# Patient Record
Sex: Male | Born: 1945 | Race: White | Hispanic: No | Marital: Single | Smoking: Never smoker
Health system: Southern US, Community
[De-identification: ages and names within clinical notes are randomized; demographics above are authoritative.]

## PROBLEM LIST (undated history)

## (undated) DIAGNOSIS — E785 Hyperlipidemia, unspecified: Secondary | ICD-10-CM

## (undated) DIAGNOSIS — IMO0001 Reserved for inherently not codable concepts without codable children: Secondary | ICD-10-CM

## (undated) DIAGNOSIS — G4730 Sleep apnea, unspecified: Secondary | ICD-10-CM

## (undated) DIAGNOSIS — L259 Unspecified contact dermatitis, unspecified cause: Secondary | ICD-10-CM

## (undated) DIAGNOSIS — M1990 Unspecified osteoarthritis, unspecified site: Secondary | ICD-10-CM

## (undated) DIAGNOSIS — I639 Cerebral infarction, unspecified: Secondary | ICD-10-CM

## (undated) DIAGNOSIS — I6529 Occlusion and stenosis of unspecified carotid artery: Secondary | ICD-10-CM

## (undated) DIAGNOSIS — I10 Essential (primary) hypertension: Secondary | ICD-10-CM

## (undated) DIAGNOSIS — I77810 Thoracic aortic ectasia: Secondary | ICD-10-CM

## (undated) DIAGNOSIS — I4891 Unspecified atrial fibrillation: Secondary | ICD-10-CM

## (undated) DIAGNOSIS — J45909 Unspecified asthma, uncomplicated: Secondary | ICD-10-CM

## (undated) DIAGNOSIS — R0683 Snoring: Secondary | ICD-10-CM

## (undated) DIAGNOSIS — K635 Polyp of colon: Secondary | ICD-10-CM

## (undated) DIAGNOSIS — I4819 Other persistent atrial fibrillation: Secondary | ICD-10-CM

## (undated) DIAGNOSIS — D649 Anemia, unspecified: Secondary | ICD-10-CM

## (undated) DIAGNOSIS — E663 Overweight: Secondary | ICD-10-CM

## (undated) DIAGNOSIS — R0602 Shortness of breath: Secondary | ICD-10-CM

## (undated) HISTORY — DX: Unspecified osteoarthritis, unspecified site: M19.90

## (undated) HISTORY — DX: Snoring: R06.83

## (undated) HISTORY — DX: Reserved for inherently not codable concepts without codable children: IMO0001

## (undated) HISTORY — DX: Polyp of colon: K63.5

## (undated) HISTORY — DX: Cerebral infarction, unspecified: I63.9

## (undated) HISTORY — DX: Unspecified contact dermatitis, unspecified cause: L25.9

## (undated) HISTORY — DX: Essential (primary) hypertension: I10

## (undated) HISTORY — DX: Other persistent atrial fibrillation: I48.19

## (undated) HISTORY — DX: Overweight: E66.3

## (undated) HISTORY — DX: Hyperlipidemia, unspecified: E78.5

## (undated) HISTORY — DX: Shortness of breath: R06.02

## (undated) HISTORY — PX: COLONOSCOPY: SHX174

## (undated) HISTORY — DX: Anemia, unspecified: D64.9

## (undated) HISTORY — DX: Thoracic aortic ectasia: I77.810

---

## 1967-11-13 HISTORY — PX: OTHER SURGICAL HISTORY: SHX169

## 2000-12-23 ENCOUNTER — Observation Stay (HOSPITAL_COMMUNITY): Admission: EM | Admit: 2000-12-23 | Discharge: 2000-12-24 | Payer: Self-pay | Admitting: Emergency Medicine

## 2000-12-23 ENCOUNTER — Encounter: Payer: Self-pay | Admitting: Emergency Medicine

## 2000-12-30 ENCOUNTER — Ambulatory Visit (HOSPITAL_COMMUNITY): Admission: RE | Admit: 2000-12-30 | Discharge: 2000-12-30 | Payer: Self-pay | Admitting: Cardiology

## 2013-08-24 ENCOUNTER — Other Ambulatory Visit: Payer: Self-pay | Admitting: Family Medicine

## 2013-08-24 DIAGNOSIS — R1011 Right upper quadrant pain: Secondary | ICD-10-CM

## 2013-08-28 ENCOUNTER — Other Ambulatory Visit: Payer: Self-pay | Admitting: Family Medicine

## 2013-08-28 ENCOUNTER — Ambulatory Visit
Admission: RE | Admit: 2013-08-28 | Discharge: 2013-08-28 | Disposition: A | Discharge status: Home / Self Care | Payer: Medicare Other | Source: Ambulatory Visit | Attending: Family Medicine | Admitting: Family Medicine

## 2013-08-28 DIAGNOSIS — R1011 Right upper quadrant pain: Secondary | ICD-10-CM

## 2013-09-14 ENCOUNTER — Other Ambulatory Visit: Payer: Self-pay | Admitting: Gastroenterology

## 2013-09-14 DIAGNOSIS — R109 Unspecified abdominal pain: Secondary | ICD-10-CM

## 2013-09-18 ENCOUNTER — Ambulatory Visit
Admission: RE | Admit: 2013-09-18 | Discharge: 2013-09-18 | Disposition: A | Discharge status: Home / Self Care | Payer: Medicare Other | Source: Ambulatory Visit | Attending: Gastroenterology | Admitting: Gastroenterology

## 2013-09-18 DIAGNOSIS — R109 Unspecified abdominal pain: Secondary | ICD-10-CM

## 2013-09-18 MED ORDER — IOHEXOL 300 MG/ML  SOLN
100.0000 mL | Freq: Once | INTRAMUSCULAR | Status: AC | PRN
  Administered 2013-09-18: 100 mL via INTRAVENOUS

## 2014-01-15 ENCOUNTER — Encounter: Payer: Self-pay | Admitting: Cardiology

## 2014-01-15 ENCOUNTER — Ambulatory Visit (INDEPENDENT_AMBULATORY_CARE_PROVIDER_SITE_OTHER): Payer: Medicare Other | Admitting: Cardiology

## 2014-01-15 ENCOUNTER — Encounter: Payer: Self-pay | Admitting: General Surgery

## 2014-01-15 VITALS — BP 154/90 | HR 64 | Ht 67.25 in | Wt 204.8 lb

## 2014-01-15 DIAGNOSIS — D649 Anemia, unspecified: Secondary | ICD-10-CM

## 2014-01-15 DIAGNOSIS — R9430 Abnormal result of cardiovascular function study, unspecified: Secondary | ICD-10-CM

## 2014-01-15 DIAGNOSIS — E785 Hyperlipidemia, unspecified: Secondary | ICD-10-CM

## 2014-01-15 DIAGNOSIS — R0600 Dyspnea, unspecified: Secondary | ICD-10-CM

## 2014-01-15 DIAGNOSIS — I4891 Unspecified atrial fibrillation: Secondary | ICD-10-CM

## 2014-01-15 DIAGNOSIS — R0989 Other specified symptoms and signs involving the circulatory and respiratory systems: Secondary | ICD-10-CM

## 2014-01-15 DIAGNOSIS — D126 Benign neoplasm of colon, unspecified: Secondary | ICD-10-CM

## 2014-01-15 DIAGNOSIS — R0602 Shortness of breath: Secondary | ICD-10-CM

## 2014-01-15 DIAGNOSIS — K635 Polyp of colon: Secondary | ICD-10-CM

## 2014-01-15 DIAGNOSIS — R0609 Other forms of dyspnea: Secondary | ICD-10-CM

## 2014-01-15 DIAGNOSIS — IMO0001 Reserved for inherently not codable concepts without codable children: Secondary | ICD-10-CM

## 2014-01-15 DIAGNOSIS — I482 Chronic atrial fibrillation, unspecified: Secondary | ICD-10-CM | POA: Insufficient documentation

## 2014-01-15 DIAGNOSIS — Z136 Encounter for screening for cardiovascular disorders: Secondary | ICD-10-CM

## 2014-01-15 NOTE — Progress Notes (Signed)
HPI  The Jared Whitaker is seen today to establish cardiology care for the evaluation of shortness of breath and atrial fibrillation. He is new to me. He was referred by Dr. Rex Kras. He was added to my schedule today so that he could be seen as a next day visit after seeing Dr. Rex Kras. I have reviewed the old records available in this electronic record. The Jared Whitaker had a nuclear stress test in 2002. There was no scar or ischemia. The ejection fraction was 57%. Have also reviewed the notes and labs sent from Dr. Eddie Dibbles office. In addition we have contacted Dr. Eddie Dibbles office for further labs that were obtained yesterday afternoon.  In 2002, it was felt that the Jared Whitaker had no evidence of coronary artery disease based on his nuclear scan. He has been active over the years. Within the last year he had a problem with blood in his sperm. He was seen by urology. He was put on very high dose nonsteroidal medications at that time. He thinks that he became short of breath with this after approximately 5 days. Ultimately the blood in his sperm disappeared. He does have a mild ongoing borderline anemia that is being evaluated by Dr. Rex Kras. He has continued to have intermittent dyspnea on exertion. He describes that he might feel this when walking across the house at night time. However he was able to shovel snow without difficulty. He's not having any significant chest pain. When he saw Dr. Rex Kras yesterday his EKG revealed atrial fibrillation. The rate was controlled. This is a new diagnosis. We do not know how long he has had atrial fibrillation. He does not sense palpitations. Hemoglobin checked yesterday was 12.6. TSH was normal.  Allergies  Allergen Reactions  . Penicillins Rash    No current outpatient prescriptions on file.   No current facility-administered medications for this visit.    History   Social History  . Marital Status: Single    Spouse Name: N/A    Number of Children: N/A  . Years of  Education: N/A   Occupational History  . Not on file.   Social History Main Topics  . Smoking status: Never Smoker   . Smokeless tobacco: Not on file  . Alcohol Use: 0.0 oz/week     Comment: 3 six packs a week  . Drug Use: No  . Sexual Activity: Not on file   Other Topics Concern  . Not on file   Social History Narrative  . No narrative on file    History reviewed. No pertinent family history.  Past Medical History  Diagnosis Date  . Colon polyp     Tubular adenoma- in colonoscopy in 07/2006 followed by GI Dr [name]  . Contact dermatitis   . Hyperlipidemia   . Osteoarthritis     of the shoulders  . Anemia   . Normal cardiac stress test   . Ejection fraction   . Shortness of breath   . Anemia     Past Surgical History  Procedure Laterality Date  . Colonoscopy    . Left leg fracture  1969    Jared Whitaker Active Problem List   Diagnosis Date Noted  . Atrial fibrillation 01/15/2014  . DOE (dyspnea on exertion) 01/15/2014  . Normal cardiac stress test   . Ejection fraction   . Colon polyp   . Hyperlipidemia   . Anemia     ROS   Jared Whitaker denies fever, chills, headache, sweats, rash, change in vision, change in  hearing, chest pain, cough, nausea vomiting, urinary symptoms. All other systems are reviewed and are negative.  PHYSICAL EXAM   Jared Whitaker is oriented to person time and place. Affect is normal. There is no jugulovenous distention. There is mild kyphosis of the thoracic spine. Lungs are clear. Respiratory effort is nonlabored. Cardiac exam reveals S1 and S2. There no clicks or significant murmurs. The abdomen is soft. There is no peripheral edema. There no musculoskeletal deformities. There are no skin rashes.  Filed Vitals:   01/15/14 1157  BP: 154/90  Pulse: 64  Height: 5' 7.25" (1.708 m)  Weight: 204 lb 12.8 oz (92.897 kg)   EKG was done January 14, 2014. This was done Dr. Eddie Dibbles office. There was atrial fibrillation with a controlled rate. There was  incomplete right bundle branch block. EKG was repeated again today to see if the rhythm persisted. There was atrial fibrillation. The rate was on the slow side today. There was incomplete right bundle branch block.  ASSESSMENT & PLAN

## 2014-01-15 NOTE — Assessment & Plan Note (Signed)
Atrial fibrillation is present. Duration is unknown. TSH is normal. Left ventricular function was normal in 2002. Followup two-dimensional echo will be done to reassess left jugular function and valvular function. The rate is controlled at this time. The patient has borderline hypertension. His CHADS-VAS score is 1 at the most. Therefore he does not need anticoagulation as of today. I will start anticoagulation if I decide to proceed with cardioversion. I've not decide about this yet. There is question of mild anemia. It would be good to try to return him to sinus rhythm if we can.

## 2014-01-15 NOTE — Patient Instructions (Addendum)
**Note De-Identified Malvin Morrish Obfuscation** Your physician has requested that you have an echocardiogram. Echocardiography is a painless test that uses sound waves to create images of your heart. It provides your doctor with information about the size and shape of your heart and how well your heart's chambers and valves are working. This procedure takes approximately one hour. There are no restrictions for this procedure. Per Dr Ron Parker Echo to be done before next  follow on March 18.  Your physician recommends that you schedule a follow-up appointment in: March 18

## 2014-01-15 NOTE — Assessment & Plan Note (Signed)
Most recent hemoglobin is 12.6. This is being followed and assessed by his primary team.  As part of this evaluation today I have spent greater than one hour with his overall care. A great deal this time was spent obtaining his history. In addition I have reviewed multiple sources of records. I explained to him at great length about atrial fibrillation.

## 2014-01-15 NOTE — Assessment & Plan Note (Signed)
Ejection fraction was normal in 2002. More information will be obtained by echo.

## 2014-01-15 NOTE — Assessment & Plan Note (Signed)
Etiology of shortness of breath with exertion is not clear. It could be related to increased atrial fib rate. We will learn more about his left ventricular function with the echo data is back. His shortness of breath is only intermittent. Further evaluation will be determined after I have echo data.

## 2014-01-20 ENCOUNTER — Ambulatory Visit (HOSPITAL_COMMUNITY)
Admission: RE | Admit: 2014-01-20 | Discharge: 2014-01-20 | Disposition: A | Discharge status: Home / Self Care | Payer: Medicare Other | Source: Ambulatory Visit | Attending: Cardiology | Admitting: Cardiology

## 2014-01-20 DIAGNOSIS — I059 Rheumatic mitral valve disease, unspecified: Secondary | ICD-10-CM

## 2014-01-20 DIAGNOSIS — I4891 Unspecified atrial fibrillation: Secondary | ICD-10-CM | POA: Insufficient documentation

## 2014-01-20 DIAGNOSIS — R0602 Shortness of breath: Secondary | ICD-10-CM | POA: Insufficient documentation

## 2014-01-20 NOTE — Progress Notes (Signed)
  Echocardiogram 2D Echocardiogram has been performed.  Jared Whitaker 01/20/2014, 2:38 PM

## 2014-01-22 ENCOUNTER — Encounter: Payer: Self-pay | Admitting: Cardiology

## 2014-01-27 ENCOUNTER — Encounter: Payer: Self-pay | Admitting: Cardiology

## 2014-01-27 ENCOUNTER — Ambulatory Visit (INDEPENDENT_AMBULATORY_CARE_PROVIDER_SITE_OTHER): Payer: Medicare Other | Admitting: Cardiology

## 2014-01-27 VITALS — BP 152/90 | HR 52 | Ht 67.25 in | Wt 203.0 lb

## 2014-01-27 DIAGNOSIS — R0989 Other specified symptoms and signs involving the circulatory and respiratory systems: Secondary | ICD-10-CM

## 2014-01-27 DIAGNOSIS — R0600 Dyspnea, unspecified: Secondary | ICD-10-CM

## 2014-01-27 DIAGNOSIS — R0609 Other forms of dyspnea: Secondary | ICD-10-CM

## 2014-01-27 DIAGNOSIS — I4891 Unspecified atrial fibrillation: Secondary | ICD-10-CM

## 2014-01-27 DIAGNOSIS — D649 Anemia, unspecified: Secondary | ICD-10-CM

## 2014-01-27 MED ORDER — RIVAROXABAN 20 MG PO TABS: 20.0000 mg | ORAL_TABLET | Freq: Every day | ORAL | Status: DC

## 2014-01-27 NOTE — Progress Notes (Signed)
Patient ID: Jared Whitaker, male   DOB: 01-Dec-1945, 68 y.o.   MRN: [id number]    HPI  Patient returns today for followup of shortness of breath and atrial fibrillation. I saw him in consultation January 15, 2014. There was question of anemia at that time. I chose not to anticoagulate him at that point. Two-dimensional echo was done showing an EF of 55-60%. He is now back for followup. His EKG shows atrial flutter with a slow rate. His hemoglobin has been stable. There are plans for him to have a three-month followup with his primary team. His stools were negative.  Allergies  Allergen Reactions  . Penicillins Rash    Current Outpatient Prescriptions  Medication Sig Dispense Refill  . Rivaroxaban (XARELTO) 20 MG TABS tablet Take 1 tablet (20 mg total) by mouth daily with supper.  30 tablet  3   No current facility-administered medications for this visit.    History   Social History  . Marital Status: Single    Spouse Name: N/A    Number of Children: N/A  . Years of Education: N/A   Occupational History  . Not on file.   Social History Main Topics  . Smoking status: Never Smoker   . Smokeless tobacco: Not on file  . Alcohol Use: 0.0 oz/week     Comment: 3 six packs a week  . Drug Use: No  . Sexual Activity: Not on file   Other Topics Concern  . Not on file   Social History Narrative  . No narrative on file    History reviewed. No pertinent family history.  Past Medical History  Diagnosis Date  . Colon polyp     Tubular adenoma- in colonoscopy in 07/2006 followed by GI Dr [name]  . Contact dermatitis   . Hyperlipidemia   . Osteoarthritis     of the shoulders  . Anemia   . Normal cardiac stress test   . Ejection fraction   . Shortness of breath   . Anemia     Past Surgical History  Procedure Laterality Date  . Colonoscopy    . Left leg fracture  1969    Patient Active Problem List   Diagnosis Date Noted  . Atrial fibrillation 01/15/2014  . DOE (dyspnea on  exertion) 01/15/2014  . Normal cardiac stress test   . Ejection fraction   . Colon polyp   . Hyperlipidemia   . Anemia     ROS  Patient denies fever, chills, headache, sweats, rash, change in vision, change in hearing, chest pain, cough, nausea vomiting, urinary symptoms. All other systems are reviewed and are negative.  PHYSICAL EXAM  Patient is oriented to person time and place. Affect is normal. There is no jugulovenous distention. Lungs are clear. Respiratory effort is nonlabored. Cardiac exam reveals S1 and S2. There no clicks or significant murmurs. The rate is controlled. The rhythm is irregularly irregular. The abdomen is soft. There is no peripheral edema.  Filed Vitals:   01/27/14 1116  BP: 152/90  Pulse: 52  Height: 5' 7.25" (1.708 m)  Weight: 203 lb (92.08 kg)   EKG is done today and reviewed by me. He has atrial fibrillation with a continued slow response.  ASSESSMENT & PLAN

## 2014-01-27 NOTE — Assessment & Plan Note (Signed)
It appears his anemia is under control. There is no definite basis to withhold anticoagulation at this point.

## 2014-01-27 NOTE — Assessment & Plan Note (Signed)
He has actually not had any significant shortness of breath since he was here last.

## 2014-01-27 NOTE — Patient Instructions (Signed)
**Note De-Identified Jared Whitaker Obfuscation** Your physician has recommended you make the following change in your medication: start taking Xarelto 20 mg with super   Your physician recommends that you schedule a follow-up appointment in: April 7 at 2:00

## 2014-01-27 NOTE — Assessment & Plan Note (Signed)
He has continued atrial fibrillation. He has a low risk score. It is now time to anticoagulate him in preparation for cardioversion. Anticoagulation was started and also seen back in approximately 3 weeks. At that time we'll make a final plan concerning probable cardioversion. I've discussed this fully with the patient

## 2014-02-16 ENCOUNTER — Ambulatory Visit (INDEPENDENT_AMBULATORY_CARE_PROVIDER_SITE_OTHER): Payer: Medicare Other | Admitting: Cardiology

## 2014-02-16 ENCOUNTER — Encounter: Payer: Self-pay | Admitting: Cardiology

## 2014-02-16 VITALS — BP 148/82 | HR 58 | Ht 67.25 in | Wt 208.4 lb

## 2014-02-16 DIAGNOSIS — R0609 Other forms of dyspnea: Secondary | ICD-10-CM

## 2014-02-16 DIAGNOSIS — R0989 Other specified symptoms and signs involving the circulatory and respiratory systems: Secondary | ICD-10-CM

## 2014-02-16 DIAGNOSIS — Z7901 Long term (current) use of anticoagulants: Secondary | ICD-10-CM

## 2014-02-16 DIAGNOSIS — Z9229 Personal history of other drug therapy: Secondary | ICD-10-CM

## 2014-02-16 DIAGNOSIS — I4891 Unspecified atrial fibrillation: Secondary | ICD-10-CM

## 2014-02-16 DIAGNOSIS — R0600 Dyspnea, unspecified: Secondary | ICD-10-CM

## 2014-02-16 LAB — CBC WITH DIFFERENTIAL/PLATELET
Basophils Absolute: 0.1 10*3/uL (ref 0.0–0.1)
Basophils Relative: 1 % (ref 0.0–3.0)
EOS PCT: 4.2 % (ref 0.0–5.0)
Eosinophils Absolute: 0.2 10*3/uL (ref 0.0–0.7)
HEMATOCRIT: 35.6 % — AB (ref 39.0–52.0)
HEMOGLOBIN: 12.1 g/dL — AB (ref 13.0–17.0)
Lymphocytes Relative: 41.2 % (ref 12.0–46.0)
Lymphs Abs: 2.4 10*3/uL (ref 0.7–4.0)
MCHC: 34.1 g/dL (ref 30.0–36.0)
MCV: 95 fl (ref 78.0–100.0)
Monocytes Absolute: 0.6 10*3/uL (ref 0.1–1.0)
Monocytes Relative: 9.6 % (ref 3.0–12.0)
NEUTROS ABS: 2.5 10*3/uL (ref 1.4–7.7)
Neutrophils Relative %: 44 % (ref 43.0–77.0)
Platelets: 228 10*3/uL (ref 150.0–400.0)
RBC: 3.74 Mil/uL — ABNORMAL LOW (ref 4.22–5.81)
RDW: 12.6 % (ref 11.5–14.6)
WBC: 5.8 10*3/uL (ref 4.5–10.5)

## 2014-02-16 LAB — BASIC METABOLIC PANEL
BUN: 14 mg/dL (ref 6–23)
CHLORIDE: 107 meq/L (ref 96–112)
CO2: 28 mEq/L (ref 19–32)
Calcium: 9.2 mg/dL (ref 8.4–10.5)
Creatinine, Ser: 1 mg/dL (ref 0.4–1.5)
GFR: 75.11 mL/min (ref >60.00)
GLUCOSE: 84 mg/dL (ref 70–99)
POTASSIUM: 4.6 meq/L (ref 3.5–5.1)
Sodium: 139 mEq/L (ref 135–145)

## 2014-02-16 LAB — PROTIME-INR
INR: 1.3 ratio — ABNORMAL HIGH (ref 0.8–1.0)
Prothrombin Time: 13.8 s — ABNORMAL HIGH (ref 10.2–12.4)

## 2014-02-16 NOTE — Progress Notes (Signed)
Patient ID: Jared Whitaker, male   DOB: 08-Feb-1946, 68 y.o.   MRN: [id number]    HPI  Patient is seen today for followup atrial fibrillation. He is fully anticoagulated. He is very reliable with his medications. He probably has mild symptoms from his atrial fib but he is managing without difficulty.  Allergies  Allergen Reactions  . Penicillins Rash    Current Outpatient Prescriptions  Medication Sig Dispense Refill  . Rivaroxaban (XARELTO) 20 MG TABS tablet Take 1 tablet (20 mg total) by mouth daily with supper.  30 tablet  3   No current facility-administered medications for this visit.    History   Social History  . Marital Status: Single    Spouse Name: N/A    Number of Children: N/A  . Years of Education: N/A   Occupational History  . Not on file.   Social History Main Topics  . Smoking status: Never Smoker   . Smokeless tobacco: Not on file  . Alcohol Use: 0.0 oz/week     Comment: 3 six packs a week  . Drug Use: No  . Sexual Activity: Not on file   Other Topics Concern  . Not on file   Social History Narrative  . No narrative on file    History reviewed. No pertinent family history.  Past Medical History  Diagnosis Date  . Colon polyp     Tubular adenoma- in colonoscopy in 07/2006 followed by GI Dr [name]  . Contact dermatitis   . Hyperlipidemia   . Osteoarthritis     of the shoulders  . Anemia   . Normal cardiac stress test   . Ejection fraction   . Shortness of breath   . Anemia     Past Surgical History  Procedure Laterality Date  . Colonoscopy    . Left leg fracture  1969    Patient Active Problem List   Diagnosis Date Noted  . Atrial fibrillation 01/15/2014  . DOE (dyspnea on exertion) 01/15/2014  . Normal cardiac stress test   . Ejection fraction   . Colon polyp   . Hyperlipidemia   . Anemia     ROS   Patient denies fever, chills, headache, sweats, rash, change in vision, change in hearing, chest pain, cough, nausea vomiting,  urinary symptoms. All other systems are reviewed and are negative.  PHYSICAL EXAM  Patient is oriented to person time and place. Affect is normal. There is no jugulovenous distention. Lungs are clear. Respiratory effort is nonlabored. Cardiac exam reveals S1 and S2. The rhythm is irregularly irregular. The abdomen is soft. There is no peripheral edema. There no musculoskeletal deformities. There no skin rashes.  Filed Vitals:   02/16/14 1352  BP: 148/82  Pulse: 58  Height: 5' 7.25" (1.708 m)  Weight: 208 lb 6.4 oz (94.53 kg)  SpO2: 99%   EKG is done today and reviewed by me. He has continued atrial fibrillation. The rate is controlled.  ASSESSMENT & PLAN

## 2014-02-16 NOTE — Patient Instructions (Signed)
Your physician has recommended that you have a Cardioversion (DCCV). Electrical Cardioversion uses a jolt of electricity to your heart either through paddles or wired patches attached to your chest. This is a controlled, usually prescheduled, procedure. Defibrillation is done under light anesthesia in the hospital, and you usually go home the day of the procedure. This is done to get your heart back into a normal rhythm. You are not awake for the procedure. Please see the instruction sheet given to you today.  Your physician recommends that you return for lab work in: today  Your physician recommends that you schedule a follow-up appointment in: 3 weeks

## 2014-02-16 NOTE — Assessment & Plan Note (Signed)
Patient continues to have atrial fibrillation. We again discussed the plan for cardioversion. I described the procedure to him. I explained to him at length how we will proceed if he holds sinus rhythm and what we will do if he does not hold. I will do his cardioversion on Wednesday, March 03, 2014.  As part of today's evaluation I spent greater than 25 minutes with is total care. More than half of this time has been with direct contact with him discussing all aspects of a trip fibrillation and anticoagulation and cardioversion. I am also writing his cardioversion orders.

## 2014-02-16 NOTE — Assessment & Plan Note (Signed)
He is on Xarelto and has not taken it 20 days.

## 2014-02-16 NOTE — Assessment & Plan Note (Signed)
He's having only mild dyspnea at this time. I am hopeful that he will feel even better when we get him back to sinus rhythm.

## 2014-03-02 MED ORDER — SODIUM CHLORIDE 0.9 % IV SOLN
INTRAVENOUS | Status: DC
  Administered 2014-03-03: 13:00:00 via INTRAVENOUS

## 2014-03-03 ENCOUNTER — Encounter (HOSPITAL_COMMUNITY)
Admission: RE | Disposition: A | Discharge status: Home / Self Care | Payer: Self-pay | Source: Ambulatory Visit | Attending: Cardiology

## 2014-03-03 ENCOUNTER — Encounter (HOSPITAL_COMMUNITY): Payer: Medicare Other | Admitting: Anesthesiology

## 2014-03-03 ENCOUNTER — Encounter (HOSPITAL_COMMUNITY): Payer: Self-pay | Admitting: Anesthesiology

## 2014-03-03 ENCOUNTER — Ambulatory Visit (HOSPITAL_COMMUNITY)
Admission: RE | Admit: 2014-03-03 | Discharge: 2014-03-03 | Disposition: A | Discharge status: Home / Self Care | Payer: Medicare Other | Source: Ambulatory Visit | Attending: Cardiology | Admitting: Cardiology

## 2014-03-03 ENCOUNTER — Ambulatory Visit (HOSPITAL_COMMUNITY): Payer: Medicare Other | Admitting: Anesthesiology

## 2014-03-03 DIAGNOSIS — R0989 Other specified symptoms and signs involving the circulatory and respiratory systems: Secondary | ICD-10-CM | POA: Insufficient documentation

## 2014-03-03 DIAGNOSIS — E785 Hyperlipidemia, unspecified: Secondary | ICD-10-CM | POA: Insufficient documentation

## 2014-03-03 DIAGNOSIS — I4891 Unspecified atrial fibrillation: Secondary | ICD-10-CM

## 2014-03-03 DIAGNOSIS — D649 Anemia, unspecified: Secondary | ICD-10-CM | POA: Insufficient documentation

## 2014-03-03 DIAGNOSIS — Z860100 Personal history of colon polyps, unspecified: Secondary | ICD-10-CM | POA: Insufficient documentation

## 2014-03-03 DIAGNOSIS — M19019 Primary osteoarthritis, unspecified shoulder: Secondary | ICD-10-CM | POA: Insufficient documentation

## 2014-03-03 DIAGNOSIS — L259 Unspecified contact dermatitis, unspecified cause: Secondary | ICD-10-CM | POA: Insufficient documentation

## 2014-03-03 DIAGNOSIS — R0609 Other forms of dyspnea: Secondary | ICD-10-CM | POA: Insufficient documentation

## 2014-03-03 HISTORY — PX: CARDIOVERSION: SHX1299

## 2014-03-03 SURGERY — CARDIOVERSION
Anesthesia: General

## 2014-03-03 MED ORDER — HYDROCORTISONE 1 % EX CREA
1.0000 "application " | TOPICAL_CREAM | Freq: Every day | CUTANEOUS | Status: DC
  Filled 2014-03-03 (×2): qty 28

## 2014-03-03 MED ORDER — PROPOFOL 10 MG/ML IV BOLUS
INTRAVENOUS | Status: DC | PRN
  Administered 2014-03-03: 100 mg via INTRAVENOUS

## 2014-03-03 MED ORDER — LIDOCAINE HCL (CARDIAC) 20 MG/ML IV SOLN
INTRAVENOUS | Status: DC | PRN
  Administered 2014-03-03: 20 mg via INTRAVENOUS

## 2014-03-03 NOTE — H&P (Addendum)
Patient ID: Jared Whitaker, male   DOB: 19-May-1946, 68 y.o.   MRN: [id number]    HPI:  For cardioversion today, March 03, 2014:  Information from the office as outlined below.. My final comments are at the bottom of this note.  HPI  Patient is seen today for followup atrial fibrillation. He is fully anticoagulated. He is very reliable with his medications. He probably has mild symptoms from his atrial fib but he is managing without difficulty.    Allergies   Allergen  Reactions   .  Penicillins  Rash         Current Outpatient Prescriptions   Medication  Sig  Dispense  Refill   .  Rivaroxaban (XARELTO) 20 MG TABS tablet  Take 1 tablet (20 mg total) by mouth daily with supper.   30 tablet   3       No current facility-administered medications for this visit.         History       Social History   .  Marital Status:  Single       Spouse Name:  N/A       Number of Children:  N/A   .  Years of Education:  N/A       Occupational History   .  Not on file.       Social History Main Topics   .  Smoking status:  Never Smoker    .  Smokeless tobacco:  Not on file   .  Alcohol Use:  0.0 oz/week         Comment: 3 six packs a week   .  Drug Use:  No   .  Sexual Activity:  Not on file       Other Topics  Concern   .  Not on file       Social History Narrative   .  No narrative on file        History reviewed. No pertinent family history.    Past Medical History   Diagnosis  Date   .  Colon polyp         Tubular adenoma- in colonoscopy in 07/2006 followed by GI Dr [name]   .  Contact dermatitis     .  Hyperlipidemia     .  Osteoarthritis         of the shoulders   .  Anemia     .  Normal cardiac stress test     .  Ejection fraction     .  Shortness of breath     .  Anemia           Past Surgical History   Procedure  Laterality  Date   .  Colonoscopy       .  Left leg fracture    1969         Patient Active Problem List     Diagnosis   Date Noted   .  Atrial fibrillation  01/15/2014   .  DOE (dyspnea on exertion)  01/15/2014   .  Normal cardiac stress test     .  Ejection fraction     .  Colon polyp     .  Hyperlipidemia     .  Anemia          ROS    Patient denies fever, chills, headache, sweats, rash,  change in vision, change in hearing, chest pain, cough, nausea vomiting, urinary symptoms. All other systems are reviewed and are negative.   PHYSICAL EXAM  Patient is oriented to person time and place. Affect is normal. There is no jugulovenous distention. Lungs are clear. Respiratory effort is nonlabored. Cardiac exam reveals S1 and S2. The rhythm is irregularly irregular. The abdomen is soft. There is no peripheral edema. There no musculoskeletal deformities. There no skin rashes.    Filed Vitals:     02/16/14 1352   BP:  148/82   Pulse:  58   Height:  5' 7.25" (1.708 m)   Weight:  208 lb 6.4 oz (94.53 kg)   SpO2:  99%      EKG is done today and reviewed by me. He has continued atrial fibrillation. The rate is controlled.   ASSESSMENT & PLAN            DOE (dyspnea on exertion) - Carlena Bjornstad, MD at 02/16/2014  2:16 PM    Status: Written Related Problem: DOE (dyspnea on exertion)    He's having only mild dyspnea at this time. I am hopeful that he will feel even better when we get him back to sinus rhythm.         HX: anticoagulation - Carlena Bjornstad, MD at 02/16/2014  2:16 PM    Status: Written Related Problem: HX: anticoagulation    He is on Xarelto and has not taken it 20 days.         Atrial fibrillation - Carlena Bjornstad, MD at 02/16/2014  2:18 PM    Status: Written Related Problem: Atrial fibrillation    Patient continues to have atrial fibrillation. We again discussed the plan for cardioversion. I described the procedure to him. I explained to him at length how we will proceed if he holds sinus rhythm and what we will do if he does not hold. I will do his cardioversion on Wednesday, March 03, 2014.   As part of today's evaluation I spent greater than 25 minutes with is total care. More than half of this time has been with direct contact with him discussing all aspects of a trip fibrillation and anticoagulation and cardioversion. I am also writing his cardioversion orders.   03/03/2014:    Patient returns today for his cardioversion. He has been stable. His physical exam is unchanged. He has been very carefully taking his anticoagulant. We are ready to proceed with cardioversion.  Daryel November, MD

## 2014-03-03 NOTE — Anesthesia Procedure Notes (Signed)
Procedure Name: MAC Date/Time: 03/03/2014 12:31 PM Performed by: Kyung Rudd Pre-anesthesia Checklist: Patient identified, Emergency Drugs available, Suction available, Patient being monitored and Timeout performed Patient Re-evaluated:Patient Re-evaluated prior to inductionOxygen Delivery Method: Circle system utilized Preoxygenation: Pre-oxygenation with 100% oxygen Intubation Type: IV induction Ventilation: Mask ventilation without difficulty

## 2014-03-03 NOTE — Transfer of Care (Signed)
Immediate Anesthesia Transfer of Care Note  Patient: Jared Whitaker  Procedure(s) Performed: Procedure(s): CARDIOVERSION (N/A)  Patient Location: Endoscopy Unit  Anesthesia Type:General  Level of Consciousness: awake, alert  and oriented  Airway & Oxygen Therapy: Patient Spontanous Breathing and Patient connected to nasal cannula oxygen  Post-op Assessment: Report given to PACU RN and Post -op Vital signs reviewed and stable  Post vital signs: Reviewed and stable  Complications: No apparent anesthesia complications

## 2014-03-03 NOTE — Discharge Instructions (Signed)
Electrical Cardioversion, Care After °Refer to this sheet in the next few weeks. These instructions provide you with information on caring for yourself after your procedure. Your health care provider may also give you more specific instructions. Your treatment has been planned according to current medical practices, but problems sometimes occur. Call your health care provider if you have any problems or questions after your procedure. °WHAT TO EXPECT AFTER THE PROCEDURE °After your procedure, it is typical to have the following sensations: °· Some redness on the skin where the shocks were delivered. If this is tender, a sunburn lotion or hydrocortisone cream may help. °· Possible return of an abnormal heart rhythm within hours or days after the procedure. °HOME CARE INSTRUCTIONS °· Only take medicine as directed by your health care provider. Be sure you understand how and when to take your medicine. °· Learn how to feel your pulse and check it often. °· Limit your activity for 48 hours after the procedure or as directed. °· Avoid or minimize caffeine and other stimulants as directed. °SEEK MEDICAL CARE IF: °· You feel like your heart is beating too fast or your pulse is not regular. °· You have any questions about your medicines. °· You have bleeding that will not stop. °SEEK IMMEDIATE MEDICAL CARE IF: °· You are dizzy or feel faint. °· It is hard to breathe or you feel short of breath. °· There is a change in discomfort in your chest. °· Your speech is slurred or you have trouble moving an arm or leg on one side of your body. °· You get a serious muscle cramp that does not go away. °· Your fingers or toes turn cold or blue. °MAKE SURE YOU:  °· Understand these instructions.   °· Will watch your condition.   °· Will get help right away if you are not doing well or get worse. °Document Released: 08/19/2013 Document Reviewed: 05/13/2013 °ExitCare® Patient Information ©2014 [employer]. ° °Monitored Anesthesia Care   °Monitored anesthesia care is an anesthesia service for a medical procedure. Anesthesia is the loss of the ability to feel pain. It is produced by medications called anesthetics. It may affect a small area of your body (local anesthesia), a large area of your body (regional anesthesia), or your entire body (general anesthesia). The need for monitored anesthesia care depends your procedure, your condition, and the potential need for regional or general anesthesia. It is often provided during procedures where:  °· General anesthesia may be needed if there are complications. This is because you need special care when you are under general anesthesia.   °· You will be under local or regional anesthesia. This is so that you are able to have higher levels of anesthesia if needed.   °· You will receive calming medications (sedatives). This is especially the case if sedatives are given to put you in a semi-conscious state of relaxation (deep sedation). This is because the amount of sedative needed to produce this state can be hard to predict. Too much of a sedative can produce general anesthesia. °Monitored anesthesia care is performed by one or more caregivers who have special training in all types of anesthesia. You will need to meet with these caregivers before your procedure. During this meeting, they will ask you about your medical history. They will also give you instructions to follow. (For example, you will need to stop eating and drinking before your procedure. You may also need to stop or change medications you are taking.) During your procedure, your   caregivers will stay with you. They will:  °· Watch your condition. This includes watching you blood pressure, breathing, and level of pain.   °· Diagnose and treat problems that occur.   °· Give medications if they are needed. These may include calming medications (sedatives) and anesthetics.   °· Make sure you are comfortable.   °Having monitored anesthesia care  does not necessarily mean that you will be under anesthesia. It does mean that your caregivers will be able to manage anesthesia if you need it or if it occurs. It also means that you will be able to have a different type of anesthesia than you are having if you need it. When your procedure is complete, your caregivers will continue to watch your condition. They will make sure any medications wear off before you are allowed to go home.  °Document Released: 07/25/2005 Document Revised: 02/23/2013 Document Reviewed: 12/10/2012 °ExitCare® Patient Information ©2014 [employer]. ° ° °

## 2014-03-03 NOTE — CV Procedure (Signed)
Cardioversion:  Consent was signed. Time out was done appropriately. The patient was connected to the biphasic defibrillator with anterior posterior pads.  Anesthesia was present. The patient received 20 mg of lidocaine and 100 mg of IV Propofol.  The patient was shocked with 120 J of biphasic energy. He remained in atrial fibrillation. A second shock was given with 200 J of biphasic energy. The patient converted immediately to sinus bradycardia.    Successful cardioversion using 2 shocks. Patient tolerated the procedure well. He will go home today after fully awake. There is a lady here to help him. He is to remain on his anticoagulant. He has an appointment to see me back in the office. He will be instructed to use steroid cream on this chest.  Daryel November, MD

## 2014-03-03 NOTE — Progress Notes (Signed)
1% Hydrocortisone cream applied to chest anteriorly and posteriorly as ordered per Dr. Ron Parker.

## 2014-03-03 NOTE — Anesthesia Postprocedure Evaluation (Signed)
  Anesthesia Post-op Note  Patient: Jared Whitaker  Procedure(s) Performed: Procedure(s): CARDIOVERSION (N/A)  Patient Location: Endoscopy Unit  Anesthesia Type:General  Level of Consciousness: awake, alert  and oriented  Airway and Oxygen Therapy: Patient Spontanous Breathing and Patient connected to nasal cannula oxygen  Post-op Pain: none  Post-op Assessment: Post-op Vital signs reviewed, Patient's Cardiovascular Status Stable, Respiratory Function Stable and Patent Airway  Post-op Vital Signs: Reviewed and stable  Last Vitals:  Filed Vitals:   03/03/14 1243  BP:   Pulse:   Temp: 36.4 C  Resp:     Complications: No apparent anesthesia complications

## 2014-03-03 NOTE — Anesthesia Preprocedure Evaluation (Signed)
Anesthesia Evaluation  Patient identified by MRN, date of birth, ID band Patient awake    Reviewed: Allergy & Precautions, H&P , NPO status , Patient's Chart, lab work & pertinent test results  Airway Mallampati: II TM Distance: >3 FB Neck ROM: Full    Dental   Pulmonary shortness of breath,  breath sounds clear to auscultation        Cardiovascular + DOE + dysrhythmias Atrial Fibrillation Rhythm:Regular Rate:Normal     Neuro/Psych    GI/Hepatic   Endo/Other    Renal/GU      Musculoskeletal   Abdominal (+) + obese,   Peds  Hematology   Anesthesia Other Findings   Reproductive/Obstetrics                           Anesthesia Physical Anesthesia Plan  ASA: II  Anesthesia Plan: General   Post-op Pain Management:    Induction: Intravenous  Airway Management Planned: Mask  Additional Equipment:   Intra-op Plan:   Post-operative Plan:   Informed Consent: I have reviewed the patients History and Physical, chart, labs and discussed the procedure including the risks, benefits and alternatives for the proposed anesthesia with the patient or authorized representative who has indicated his/her understanding and acceptance.     Plan Discussed with: CRNA and Surgeon  Anesthesia Plan Comments:         Anesthesia Quick Evaluation

## 2014-03-04 ENCOUNTER — Encounter (HOSPITAL_COMMUNITY): Payer: Self-pay | Admitting: Cardiology

## 2014-03-30 ENCOUNTER — Ambulatory Visit (INDEPENDENT_AMBULATORY_CARE_PROVIDER_SITE_OTHER): Payer: Medicare Other | Admitting: Cardiology

## 2014-03-30 ENCOUNTER — Encounter: Payer: Self-pay | Admitting: Cardiology

## 2014-03-30 VITALS — BP 144/64 | HR 64 | Ht 67.25 in | Wt 204.0 lb

## 2014-03-30 DIAGNOSIS — R0989 Other specified symptoms and signs involving the circulatory and respiratory systems: Secondary | ICD-10-CM

## 2014-03-30 DIAGNOSIS — R0600 Dyspnea, unspecified: Secondary | ICD-10-CM

## 2014-03-30 DIAGNOSIS — R0609 Other forms of dyspnea: Secondary | ICD-10-CM

## 2014-03-30 DIAGNOSIS — I4891 Unspecified atrial fibrillation: Secondary | ICD-10-CM

## 2014-03-30 DIAGNOSIS — Z7901 Long term (current) use of anticoagulants: Secondary | ICD-10-CM

## 2014-03-30 DIAGNOSIS — Z9229 Personal history of other drug therapy: Secondary | ICD-10-CM

## 2014-03-30 NOTE — Assessment & Plan Note (Signed)
The patient was cardioverted with 2 shocks. EKG today remains sinus rhythm with sinus bradycardia. I have decided to continue his Xarelto for a continued period of time. This is a judgment call as he said rare shortness of breath and some slight visual issues since I saw him last. However most likely I will stop Xarelto at the time of his next visit. He and I can discuss his low risk score.

## 2014-03-30 NOTE — Assessment & Plan Note (Signed)
I've chosen to continue Xarelto until I see him at the time of his next visit.

## 2014-03-30 NOTE — Progress Notes (Signed)
Patient ID: Jared Whitaker, male   DOB: 09-22-1946, 68 y.o.   MRN: [id number]    HPI  Patient is seen today to followup atrial fibrillation. I saw him last in the office February 16, 2014. We proceeded with cardioversion on March 03, 2014. On that day he received 120 J of biphasic energy and remained in atrial fibrillation. He received a second shock of 200 J of biphasic energy and he converted to sinus rhythm. Since then he has remained on Xarelto. He has never felt palpitations. He's had some rare limited shortness of breath. He may have had another spell since his cardioversion. In addition he mentions some type of unusual change in his vision that was limited. He describes a brightness in his vision. He has had this a few times in the past. There was no headache.  Allergies  Allergen Reactions  . Penicillins Rash    Current Outpatient Prescriptions  Medication Sig Dispense Refill  . Rivaroxaban (XARELTO) 20 MG TABS tablet Take 1 tablet (20 mg total) by mouth daily with supper.  30 tablet  3   No current facility-administered medications for this visit.    History   Social History  . Marital Status: Single    Spouse Name: N/A    Number of Children: N/A  . Years of Education: N/A   Occupational History  . Not on file.   Social History Main Topics  . Smoking status: Never Smoker   . Smokeless tobacco: Not on file  . Alcohol Use: 0.0 oz/week     Comment: 3 six packs a week  . Drug Use: No  . Sexual Activity: Not on file   Other Topics Concern  . Not on file   Social History Narrative  . No narrative on file    History reviewed. No pertinent family history.  Past Medical History  Diagnosis Date  . Colon polyp     Tubular adenoma- in colonoscopy in 07/2006 followed by GI Dr [name]  . Contact dermatitis   . Hyperlipidemia   . Osteoarthritis     of the shoulders  . Anemia   . Normal cardiac stress test   . Ejection fraction   . Shortness of breath   . Anemia     Past  Surgical History  Procedure Laterality Date  . Colonoscopy    . Left leg fracture  1969  . Cardioversion N/A 03/03/2014    Procedure: CARDIOVERSION;  Surgeon: Carlena Bjornstad, MD;  Location: [facility] ENDOSCOPY;  Service: Cardiovascular;  Laterality: N/A;    Patient Active Problem List   Diagnosis Date Noted  . HX: anticoagulation 02/16/2014  . Atrial fibrillation 01/15/2014  . DOE (dyspnea on exertion) 01/15/2014  . Normal cardiac stress test   . Ejection fraction   . Colon polyp   . Hyperlipidemia   . Anemia     ROS   Patient denies fever, chills, headache, sweats, rash, change in hearing, chest pain, cough, nausea vomiting, urinary symptoms. All other systems are reviewed and are negative.  PHYSICAL EXAM  Patient is oriented to person time and place. Affect is normal. There is no jugulovenous distention. Head is atraumatic. Sclera and conjunctiva are normal. Lungs are clear. Respiratory effort is nonlabored. Cardiac exam reveals an S1 and S2. The rhythm is regular. Abdomen is soft. There is no peripheral edema. There no musculoskeletal deformities. There are no skin rashes.  Filed Vitals:   03/30/14 1411  BP: 144/64  Pulse: 64  Height: 5' 7.25" (  1.708 m)  Weight: 204 lb (92.534 kg)   EKG is done today. There is sinus rhythm. There is incomplete right bundle branch block.  ASSESSMENT & PLAN

## 2014-03-30 NOTE — Patient Instructions (Signed)
Your physician recommends that you continue on your current medications as directed. Please refer to the Current Medication list given to you today.  Your physician recommends that you schedule a follow-up appointment in: 12 weeks  

## 2014-03-30 NOTE — Assessment & Plan Note (Signed)
This is very rare. 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

## 2014-06-14 ENCOUNTER — Other Ambulatory Visit: Payer: Self-pay | Admitting: Cardiology

## 2014-07-01 ENCOUNTER — Encounter: Payer: Self-pay | Admitting: Cardiology

## 2014-07-01 ENCOUNTER — Ambulatory Visit (INDEPENDENT_AMBULATORY_CARE_PROVIDER_SITE_OTHER): Payer: Medicare Other | Admitting: Cardiology

## 2014-07-01 VITALS — BP 142/76 | HR 54 | Ht 67.25 in | Wt 209.1 lb

## 2014-07-01 DIAGNOSIS — R0609 Other forms of dyspnea: Secondary | ICD-10-CM

## 2014-07-01 DIAGNOSIS — I4891 Unspecified atrial fibrillation: Secondary | ICD-10-CM

## 2014-07-01 DIAGNOSIS — R0989 Other specified symptoms and signs involving the circulatory and respiratory systems: Secondary | ICD-10-CM

## 2014-07-01 DIAGNOSIS — I480 Paroxysmal atrial fibrillation: Secondary | ICD-10-CM

## 2014-07-01 DIAGNOSIS — R0600 Dyspnea, unspecified: Secondary | ICD-10-CM

## 2014-07-01 NOTE — Progress Notes (Signed)
Patient ID: Jared Whitaker, male   DOB: [birth year], 68 y.o.   MRN: [id number]    HPI  Patient is seen today to follow up as atrial fibrillation. He continues to feel well. He has held sinus rhythm since his cardioversion. His risk score is only 1. Therefore we will stop Xarelto as of today. I will see him back over time to decide if we need to. This decision.  Allergies  Allergen Reactions  . Penicillins Rash    Current Outpatient Prescriptions  Medication Sig Dispense Refill  . XARELTO 20 MG TABS tablet TAKE 1 TABLET BY MOUTH EVERY DAY WITH SUPPER  30 tablet  0   No current facility-administered medications for this visit.    History   Social History  . Marital Status: Single    Spouse Name: N/A    Number of Children: N/A  . Years of Education: N/A   Occupational History  . Not on file.   Social History Main Topics  . Smoking status: Never Smoker   . Smokeless tobacco: Not on file  . Alcohol Use: 0.0 oz/week     Comment: 3 six packs a week  . Drug Use: No  . Sexual Activity: Not on file   Other Topics Concern  . Not on file   Social History Narrative  . No narrative on file    History reviewed. No pertinent family history.  Past Medical History  Diagnosis Date  . Colon polyp     Tubular adenoma- in colonoscopy in 07/2006 followed by GI Dr [name]  . Contact dermatitis   . Hyperlipidemia   . Osteoarthritis     of the shoulders  . Anemia   . Normal cardiac stress test   . Ejection fraction   . Shortness of breath   . Anemia     Past Surgical History  Procedure Laterality Date  . Colonoscopy    . Left leg fracture  1969  . Cardioversion N/A 03/03/2014    Procedure: CARDIOVERSION;  Surgeon: Carlena Bjornstad, MD;  Location: [facility] ENDOSCOPY;  Service: Cardiovascular;  Laterality: N/A;    Patient Active Problem List   Diagnosis Date Noted  . HX: anticoagulation 02/16/2014  . Atrial fibrillation 01/15/2014  . DOE (dyspnea on exertion) 01/15/2014  . Normal  cardiac stress test   . Ejection fraction   . Colon polyp   . Hyperlipidemia   . Anemia     ROS   Patient denies fever, chills, headache, sweats, rash, change in vision, change in hearing, chest pain, cough, nausea or vomiting, urinary symptoms. All other systems are reviewed and are negative.  PHYSICAL EXAM  Patient is oriented to person time and place. Affect is normal. Head is atraumatic. Sclera and conjunctiva are normal. There is no jugulovenous distention. Lungs are clear. Respiratory effort is nonlabored. Cardiac exam reveals S1 and S2. There is a soft systolic murmur. The abdomen is soft. There is no peripheral edema.  Filed Vitals:   07/01/14 1401  BP: 142/76  Pulse: 54  Height: 5' 7.25" (1.708 m)  Weight: 209 lb 1.9 oz (94.856 kg)  SpO2: 98%   EKG is done today and reviewed by me. There is sinus rhythm with sinus bradycardia.  ASSESSMENT & PLAN

## 2014-07-01 NOTE — Assessment & Plan Note (Signed)
Patient is holding sinus rhythm. His risk score is 1. He has no absolute indication for either anticoagulation or aspirin. Throughout the will be stopped. I will follow him carefully over time.

## 2014-07-01 NOTE — Assessment & Plan Note (Signed)
He is not having any significant shortness of breath at this time. No further workup.

## 2014-07-01 NOTE — Patient Instructions (Signed)
**Note De-Identified Jared Whitaker Obfuscation** Your physician has recommended you make the following change in your medication: stop taking Xarelto  Your physician wants you to follow-up in: 6 months. You will receive a reminder letter in the mail two months in advance. If you don't receive a letter, please call our office to schedule the follow-up appointment.

## 2014-10-12 ENCOUNTER — Telehealth: Payer: Self-pay | Admitting: Cardiology

## 2014-10-12 NOTE — Telephone Encounter (Signed)
**Note De-Identified Amiel Sharrow Obfuscation** I have received the fax from [facility] and will give to Dr Ron Parker for his recommendations.

## 2014-10-12 NOTE — Telephone Encounter (Signed)
New Message  Jared Whitaker a physical therapist with [facility] called states that she will send a fax requesting a clearance to use biofeedback during physical therapy because the patient was in afib recently. Please call back to discuss if needed.

## 2014-10-13 NOTE — Telephone Encounter (Signed)
Okay for this patient to have biofeedback

## 2014-10-15 NOTE — Telephone Encounter (Signed)
I faxed this note to [phone] [facility] attn: Willdi Young. I did receive conformation that the fax went through successfully.

## 2015-01-10 ENCOUNTER — Ambulatory Visit (INDEPENDENT_AMBULATORY_CARE_PROVIDER_SITE_OTHER): Payer: Medicare Other | Admitting: Cardiology

## 2015-01-10 ENCOUNTER — Encounter: Payer: Self-pay | Admitting: Cardiology

## 2015-01-10 VITALS — BP 168/76 | HR 71 | Ht 67.0 in | Wt 213.8 lb

## 2015-01-10 DIAGNOSIS — R0600 Dyspnea, unspecified: Secondary | ICD-10-CM

## 2015-01-10 DIAGNOSIS — Z9229 Personal history of other drug therapy: Secondary | ICD-10-CM

## 2015-01-10 DIAGNOSIS — R0609 Other forms of dyspnea: Secondary | ICD-10-CM

## 2015-01-10 DIAGNOSIS — Z7901 Long term (current) use of anticoagulants: Secondary | ICD-10-CM

## 2015-01-10 DIAGNOSIS — I480 Paroxysmal atrial fibrillation: Secondary | ICD-10-CM

## 2015-01-10 MED ORDER — RIVAROXABAN 20 MG PO TABS
20.0000 mg | ORAL_TABLET | Freq: Every day | ORAL | Status: DC
Start: 2015-01-10 — End: 2015-05-02

## 2015-01-10 MED ORDER — FLECAINIDE ACETATE 100 MG PO TABS: 100.0000 mg | ORAL_TABLET | Freq: Two times a day (BID) | ORAL | Status: DC

## 2015-01-10 NOTE — Assessment & Plan Note (Signed)
The patient had been on Xarelto in August, 2015. It was eventually stopped. It will be restarted today.

## 2015-01-10 NOTE — Assessment & Plan Note (Signed)
Patient has exertional shortness of breath when he has atrial fibrillation. He is having some of this now.

## 2015-01-10 NOTE — Assessment & Plan Note (Signed)
The patient has had return of atrial fibrillation. His rate is controlled as it was during his last episode. He has no proven coronary disease with a negative stress study last year. The plan will be to start flecainide 100 twice a day. Because his heart rate is controlled, I've chosen not to add any other medications. I will see him back in several weeks. If he is not converted we will proceed with cardioversion.

## 2015-01-10 NOTE — Progress Notes (Signed)
Cardiology Office Note   Date:  01/10/2015   ID:  Jared Whitaker, DOB July 01, 1946, MRN [id number]  PCP:  Gennette Pac, MD  Cardiologist:  Dola Argyle, MD   Chief Complaint  Patient presents with  . Appointment    Follow-up atrial fibrillation      History of Present Illness: Jared Whitaker is a 69 y.o. male who presents today to follow-up paroxysmal atrial fibrillation. In August, 2015 the patient had atrial fibrillation. We treated him with Xarelto. He was cardioverted to sinus rhythm and did well. Xarelto was stopped eventually. His risk score is 1, in that he is 70 years of age. Today he returns stating that he thinks he is back in atrial fibrillation. Historically his symptom is exertional shortness of breath.    Past Medical History  Diagnosis Date  . Colon polyp     Tubular adenoma- in colonoscopy in 07/2006 followed by GI Dr [name]  . Contact dermatitis   . Hyperlipidemia   . Osteoarthritis     of the shoulders  . Anemia   . Normal cardiac stress test   . Ejection fraction   . Shortness of breath   . Anemia     Past Surgical History  Procedure Laterality Date  . Colonoscopy    . Left leg fracture  1969  . Cardioversion N/A 03/03/2014    Procedure: CARDIOVERSION;  Surgeon: Carlena Bjornstad, MD;  Location: [facility] ENDOSCOPY;  Service: Cardiovascular;  Laterality: N/A;    Patient Active Problem List   Diagnosis Date Noted  . HX: anticoagulation 02/16/2014  . Atrial fibrillation 01/15/2014  . DOE (dyspnea on exertion) 01/15/2014  . Normal cardiac stress test   . Ejection fraction   . Colon polyp   . Hyperlipidemia   . Anemia       Current Outpatient Prescriptions  Medication Sig Dispense Refill  . flecainide (TAMBOCOR) 100 MG tablet Take 1 tablet (100 mg total) by mouth 2 (two) times daily. 60 tablet 3  . rivaroxaban (XARELTO) 20 MG TABS tablet Take 1 tablet (20 mg total) by mouth daily with supper. 30 tablet 3   No current facility-administered  medications for this visit.    Allergies:   Penicillins    Social History:  The patient  reports that he has never smoked. He does not have any smokeless tobacco history on file. He reports that he drinks alcohol. He reports that he does not use illicit drugs.   Family History:  There is no significant family history of coronary disease.   ROS:  Please see the history of present illness.    Patient denies fever, chills, headache, sweats, rash, change in vision, change in hearing, chest pain, cough, nausea or vomiting, urinary symptoms. All other systems are reviewed and are negative.   PHYSICAL EXAM: VS:  BP 168/76 mmHg  Pulse 71  Ht 5\' 7"  (1.702 m)  Wt 213 lb 12.8 oz (96.979 kg)  BMI 33.48 kg/m2 , Patient is oriented to person time and place. Affect is normal. Head is atraumatic. Sclera and conjunctiva are normal. There is no jugular venous distention. Lungs are clear. Respiratory effort is not labored. Cardiac exam reveals S1 and S2. There are no clicks or significant murmurs. The abdomen is soft. There is no peripheral edema. There are no musculoskeletal deformities. There are no skin rashes.  EKG:   EKG is done today and reviewed by me. Patient has atrial fibrillation with a controlled ventricular response. There is an incomplete right  bundle blanch block.   Recent Labs: 02/16/2014: BUN 14; Creatinine 1.0; Hemoglobin 12.1*; Platelets 228.0; Potassium 4.6; Sodium 139    Lipid Panel No results found for: CHOL, TRIG, HDL, CHOLHDL, VLDL, LDLCALC, LDLDIRECT    Wt Readings from Last 3 Encounters:  01/10/15 213 lb 12.8 oz (96.979 kg)  07/01/14 209 lb 1.9 oz (94.856 kg)  03/30/14 204 lb (92.534 kg)      Current medicines are reviewed  The patient understands his medications well.     ASSESSMENT AND PLAN:

## 2015-01-10 NOTE — Patient Instructions (Signed)
Your physician has recommended you make the following change in your medication: Start taking Xarelto 20 mg daily at supper time and on Friday (01/14/15) start taking Flecainide 100 mg twice daily  Your physician recommends that you schedule a follow-up appointment on: 01/28/15

## 2015-01-28 ENCOUNTER — Encounter: Payer: Self-pay | Admitting: Cardiology

## 2015-01-28 ENCOUNTER — Ambulatory Visit (INDEPENDENT_AMBULATORY_CARE_PROVIDER_SITE_OTHER): Payer: Medicare Other | Admitting: Cardiology

## 2015-01-28 VITALS — BP 146/74 | HR 68 | Ht 67.0 in | Wt 210.4 lb

## 2015-01-28 DIAGNOSIS — Z9229 Personal history of other drug therapy: Secondary | ICD-10-CM

## 2015-01-28 DIAGNOSIS — Z5181 Encounter for therapeutic drug level monitoring: Secondary | ICD-10-CM | POA: Insufficient documentation

## 2015-01-28 DIAGNOSIS — I480 Paroxysmal atrial fibrillation: Secondary | ICD-10-CM

## 2015-01-28 DIAGNOSIS — Z79899 Other long term (current) drug therapy: Secondary | ICD-10-CM

## 2015-01-28 DIAGNOSIS — Z01818 Encounter for other preprocedural examination: Secondary | ICD-10-CM

## 2015-01-28 DIAGNOSIS — Z7901 Long term (current) use of anticoagulants: Secondary | ICD-10-CM

## 2015-01-28 LAB — BASIC METABOLIC PANEL
BUN: 17 mg/dL (ref 6–23)
CALCIUM: 9.4 mg/dL (ref 8.4–10.5)
CO2: 30 mEq/L (ref 19–32)
Chloride: 103 mEq/L (ref 96–112)
Creatinine, Ser: 1.13 mg/dL (ref 0.40–1.50)
GFR: 68.07 mL/min (ref >60.00)
GLUCOSE: 100 mg/dL — AB (ref 70–99)
POTASSIUM: 4 meq/L (ref 3.5–5.1)
SODIUM: 136 meq/L (ref 135–145)

## 2015-01-28 NOTE — Patient Instructions (Signed)
**Note De-Identified Anjel Perfetti Obfuscation** Your physician recommends that you continue on your current medications as directed. Please refer to the Current Medication list given to you today.  Your physician recommends that you return for lab work in: today Artist)  Your physician has recommended that you have a Cardioversion (DCCV). Electrical Cardioversion uses a jolt of electricity to your heart either through paddles or wired patches attached to your chest. This is a controlled, usually prescheduled, procedure. Defibrillation is done under light anesthesia in the hospital, and you usually go home the day of the procedure. This is done to get your heart back into a normal rhythm. You are not awake for the procedure. Please see the instruction sheet given to you today.  Your physician recommends that you schedule a follow-up appointment in: 2 to 3 weeks post cardioversion

## 2015-01-28 NOTE — Assessment & Plan Note (Signed)
The patient was started back on xarelto for his atrial fibrillation. He will have been onboard for 21 days before his cardioversion is done next week.

## 2015-01-28 NOTE — H&P (Signed)
Cardiology Office Note   Date: 01/28/2015   ID: Jared Whitaker, DOB February 22, 1946, MRN [id number]  PCP: Gennette Pac, MD Cardiologist: Dola Argyle, MD   Chief Complaint  Patient presents with  . Appointment    Follow-up atrial fibrillation     History of Present Illness: Jared Whitaker is a 69 y.o. male who presents today for follow-up of atrial fibrillation. I had cardioverted him last year and he held for many months. He then had return of atrial fibrillation. Xarelto was started. Also, I added flecainide 100 mg twice a day. He has been stable. However, he continues in atrial fibrillation. His rate is controlled. He does have symptoms with his atrial fibrillation.  In addition he is currently being evaluated for mild anemia. Historically there has not been proven GI bleeding.    Past Medical History  Diagnosis Date  . Colon polyp     Tubular adenoma- in colonoscopy in 07/2006 followed by GI Dr [name]  . Contact dermatitis   . Hyperlipidemia   . Osteoarthritis     of the shoulders  . Anemia   . Normal cardiac stress test   . Ejection fraction   . Shortness of breath   . Anemia     Past Surgical History  Procedure Laterality Date  . Colonoscopy    . Left leg fracture  1969  . Cardioversion N/A 03/03/2014    Procedure: CARDIOVERSION; Surgeon: Carlena Bjornstad, MD; Location: [facility] ENDOSCOPY; Service: Cardiovascular; Laterality: N/A;    Patient Active Problem List   Diagnosis Date Noted  . Encounter for monitoring flecainide therapy 01/28/2015  . HX: anticoagulation 02/16/2014  . Atrial fibrillation 01/15/2014  . DOE (dyspnea on exertion) 01/15/2014  . Normal cardiac stress test   . Ejection fraction   . Colon polyp   . Hyperlipidemia   . Anemia       Current Outpatient Prescriptions  Medication Sig Dispense Refill  . flecainide (TAMBOCOR)  100 MG tablet Take 1 tablet (100 mg total) by mouth 2 (two) times daily. 60 tablet 3  . rivaroxaban (XARELTO) 20 MG TABS tablet Take 1 tablet (20 mg total) by mouth daily with supper. 30 tablet 3   No current facility-administered medications for this visit.    Allergies: Penicillins    Social History: The patient  reports that he has never smoked. He does not have any smokeless tobacco history on file. He reports that he drinks alcohol. He reports that he does not use illicit drugs.   Family History: There is a family history of coronary disease.   ROS: Please see the history of present illness. Patient denies fever, chills, headache, sweats, rash, change in vision, change in hearing, chest pain, cough, nausea or vomiting, urinary symptoms. All other systems are reviewed and are negative.   PHYSICAL EXAM: VS: BP 146/74 mmHg  Pulse 68  Ht 5\' 7"  (1.702 m)  Wt 210 lb 6.4 oz (95.437 kg)  BMI 32.95 kg/m2 , Patient is stable. He is oriented to person time and place. Affect is normal. He is overweight. Head is atraumatic. Sclera and conjunctiva are normal. There is no jugulovenous distention. Lungs are clear. Respiratory effort is nonlabored. Cardiac exam reveals an S1 and S2. The rhythm is irregularly irregular. The rate is controlled. Abdomen is soft. There is no peripheral edema. There are no musculoskeletal deformities. There are no skin rashes.  EKG: EKG is done today and reviewed by me. There is atrial fibrillation. The rate is controlled. Recent Labs: 02/16/2014:  BUN 14; Creatinine 1.0; Hemoglobin 12.1*; Platelets 228.0; Potassium 4.6; Sodium 139    Lipid Panel  Labs (Brief)    No results found for: CHOL, TRIG, HDL, CHOLHDL, VLDL, LDLCALC, LDLDIRECT     Wt Readings from Last 3 Encounters:  01/28/15 210 lb 6.4 oz (95.437 kg)  01/10/15 213 lb 12.8 oz (96.979 kg)  07/01/14 209 lb 1.9 oz (94.856 kg)      Current medicines are reviewed The  patient understands his medications.     ASSESSMENT AND PLAN:              Anemia - Carlena Bjornstad, MD at 01/28/2015 3:10 PM     Status: Written Related Problem: Anemia   Expand All Collapse All   There is history of a mild anemia. This is being evaluated by his primary team. There is no evidence that this is related to bleeding from xarelto.            Atrial fibrillation - Carlena Bjornstad, MD at 01/28/2015 3:11 PM     Status: Written Related Problem: Atrial fibrillation   Expand All Collapse All   Patient has persistent atrial fibrillation. Flecainide has now been added. By the next week he will be fully anticoagulated for 21 days. It will be safe to proceed with cardioversion. This will be done on Monday, January 31, 2015.

## 2015-01-28 NOTE — Progress Notes (Signed)
Cardiology Office Note   Date:  01/28/2015   ID:  Jared Whitaker, DOB 1946/07/31, MRN [id number]  PCP:  Gennette Pac, MD  Cardiologist:  Dola Argyle, MD   Chief Complaint  Patient presents with  . Appointment    Follow-up atrial fibrillation      History of Present Illness: Jared Whitaker is a 69 y.o. male who presents today for follow-up of atrial fibrillation. I had cardioverted him last year and he held for many months. He then had return of atrial fibrillation. Xarelto was started. Also, I added flecainide 100 mg twice a day. He has been stable. However, he continues in atrial fibrillation. His rate is controlled. He does have symptoms with his atrial fibrillation.  In addition he is currently being evaluated for mild anemia. Historically there has not been proven GI bleeding.    Past Medical History  Diagnosis Date  . Colon polyp     Tubular adenoma- in colonoscopy in 07/2006 followed by GI Dr [name]  . Contact dermatitis   . Hyperlipidemia   . Osteoarthritis     of the shoulders  . Anemia   . Normal cardiac stress test   . Ejection fraction   . Shortness of breath   . Anemia     Past Surgical History  Procedure Laterality Date  . Colonoscopy    . Left leg fracture  1969  . Cardioversion N/A 03/03/2014    Procedure: CARDIOVERSION;  Surgeon: Carlena Bjornstad, MD;  Location: [facility] ENDOSCOPY;  Service: Cardiovascular;  Laterality: N/A;    Patient Active Problem List   Diagnosis Date Noted  . Encounter for monitoring flecainide therapy 01/28/2015  . HX: anticoagulation 02/16/2014  . Atrial fibrillation 01/15/2014  . DOE (dyspnea on exertion) 01/15/2014  . Normal cardiac stress test   . Ejection fraction   . Colon polyp   . Hyperlipidemia   . Anemia       Current Outpatient Prescriptions  Medication Sig Dispense Refill  . flecainide (TAMBOCOR) 100 MG tablet Take 1 tablet (100 mg total) by mouth 2 (two) times daily. 60 tablet 3  . rivaroxaban (XARELTO)  20 MG TABS tablet Take 1 tablet (20 mg total) by mouth daily with supper. 30 tablet 3   No current facility-administered medications for this visit.    Allergies:   Penicillins    Social History:  The patient  reports that he has never smoked. He does not have any smokeless tobacco history on file. He reports that he drinks alcohol. He reports that he does not use illicit drugs.   Family History:  There is a family history of coronary artery disease.   ROS:  Please see the history of present illness.     Patient denies fever, chills, headache, sweats, rash, change in vision, change in hearing, chest pain, cough, nausea or vomiting, urinary symptoms. All other systems are reviewed and are negative.   PHYSICAL EXAM: VS:  BP 146/74 mmHg  Pulse 68  Ht 5\' 7"  (1.702 m)  Wt 210 lb 6.4 oz (95.437 kg)  BMI 32.95 kg/m2 , Patient is stable. He is oriented to person time and place. Affect is normal. He is overweight. Head is atraumatic. Sclera and conjunctiva are normal. There is no jugulovenous distention. Lungs are clear. Respiratory effort is nonlabored. Cardiac exam reveals an S1 and S2. The rhythm is irregularly irregular. The rate is controlled. Abdomen is soft. There is no peripheral edema. There are no musculoskeletal deformities. There are no skin  rashes.  EKG:   EKG is done today and reviewed by me. There is atrial fibrillation. The rate is controlled. Recent Labs: 02/16/2014: BUN 14; Creatinine 1.0; Hemoglobin 12.1*; Platelets 228.0; Potassium 4.6; Sodium 139    Lipid Panel No results found for: CHOL, TRIG, HDL, CHOLHDL, VLDL, LDLCALC, LDLDIRECT    Wt Readings from Last 3 Encounters:  01/28/15 210 lb 6.4 oz (95.437 kg)  01/10/15 213 lb 12.8 oz (96.979 kg)  07/01/14 209 lb 1.9 oz (94.856 kg)      Current medicines are reviewed  The patient understands his medications.     ASSESSMENT AND PLAN:

## 2015-01-28 NOTE — Assessment & Plan Note (Signed)
There is history of a mild anemia. This is being evaluated by his primary team. There is no evidence that this is related to bleeding from xarelto.

## 2015-01-28 NOTE — Assessment & Plan Note (Addendum)
Patient has persistent atrial fibrillation. Flecainide has now been added. By the next week he will be fully anticoagulated for 21 days. It will be safe to proceed with cardioversion. This will be done on Monday, January 31, 2015.  As part of today's evaluation the patient and I talked at length about proceeding with cardioversion. We have called to schedule a cardioversion. I have written the orders for cardioversion. I've taken more than 25 minutes for his complete care. More than half of this time was related to discussing cardioversion with him.

## 2015-01-30 MED ORDER — SODIUM CHLORIDE 0.9 % IV SOLN
INTRAVENOUS | Status: DC
  Administered 2015-01-31: 500 mL via INTRAVENOUS

## 2015-01-30 NOTE — Anesthesia Preprocedure Evaluation (Signed)
Anesthesia Evaluation  Patient identified by MRN, date of birth, ID band Patient awake    Reviewed: Allergy & Precautions, NPO status , Patient's Chart, lab work & pertinent test results  Airway        Dental   Pulmonary          Cardiovascular + dysrhythmias Atrial Fibrillation  ECHO 01/2014 EF 55%   Neuro/Psych    GI/Hepatic negative GI ROS, Neg liver ROS,   Endo/Other  negative endocrine ROS  Renal/GU negative Renal ROS     Musculoskeletal   Abdominal   Peds  Hematology   Anesthesia Other Findings   Reproductive/Obstetrics                             Anesthesia Physical Anesthesia Plan  ASA: III  Anesthesia Plan: MAC   Post-op Pain Management:    Induction: Intravenous  Airway Management Planned: Nasal Cannula  Additional Equipment:   Intra-op Plan:   Post-operative Plan:   Informed Consent: I have reviewed the patients History and Physical, chart, labs and discussed the procedure including the risks, benefits and alternatives for the proposed anesthesia with the patient or authorized representative who has indicated his/her understanding and acceptance.     Plan Discussed with:   Anesthesia Plan Comments:         Anesthesia Quick Evaluation

## 2015-01-31 ENCOUNTER — Encounter (HOSPITAL_COMMUNITY): Payer: Self-pay | Admitting: *Deleted

## 2015-01-31 ENCOUNTER — Ambulatory Visit (HOSPITAL_COMMUNITY)
Admission: RE | Admit: 2015-01-31 | Discharge: 2015-01-31 | Disposition: A | Discharge status: Home / Self Care | Payer: Medicare Other | Source: Ambulatory Visit | Attending: Cardiology | Admitting: Cardiology

## 2015-01-31 ENCOUNTER — Ambulatory Visit (HOSPITAL_COMMUNITY): Payer: Medicare Other | Admitting: Anesthesiology

## 2015-01-31 ENCOUNTER — Encounter (HOSPITAL_COMMUNITY)
Admission: RE | Disposition: A | Discharge status: Home / Self Care | Payer: Self-pay | Source: Ambulatory Visit | Attending: Cardiology

## 2015-01-31 DIAGNOSIS — I4891 Unspecified atrial fibrillation: Secondary | ICD-10-CM

## 2015-01-31 DIAGNOSIS — Z01818 Encounter for other preprocedural examination: Secondary | ICD-10-CM

## 2015-01-31 DIAGNOSIS — Z5181 Encounter for therapeutic drug level monitoring: Secondary | ICD-10-CM

## 2015-01-31 DIAGNOSIS — Z79899 Other long term (current) drug therapy: Secondary | ICD-10-CM

## 2015-01-31 HISTORY — PX: CARDIOVERSION: SHX1299

## 2015-01-31 SURGERY — CARDIOVERSION
Anesthesia: Monitor Anesthesia Care

## 2015-01-31 MED ORDER — PROPOFOL 10 MG/ML IV BOLUS
INTRAVENOUS | Status: AC
  Filled 2015-01-31: qty 20

## 2015-01-31 MED ORDER — SODIUM CHLORIDE 0.9 % IV SOLN
INTRAVENOUS | Status: DC | PRN
  Administered 2015-01-31: 11:00:00 via INTRAVENOUS

## 2015-01-31 MED ORDER — LIDOCAINE HCL (CARDIAC) 20 MG/ML IV SOLN
INTRAVENOUS | Status: DC | PRN
  Administered 2015-01-31: 40 mg via INTRAVENOUS

## 2015-01-31 MED ORDER — LIDOCAINE HCL (CARDIAC) 20 MG/ML IV SOLN
INTRAVENOUS | Status: AC
  Filled 2015-01-31: qty 5

## 2015-01-31 MED ORDER — PROPOFOL 10 MG/ML IV BOLUS
INTRAVENOUS | Status: DC | PRN
  Administered 2015-01-31: 90 mg via INTRAVENOUS

## 2015-01-31 NOTE — Transfer of Care (Signed)
Immediate Anesthesia Transfer of Care Note  Patient: Jared Whitaker  Procedure(s) Performed: Procedure(s): CARDIOVERSION (N/A)  Patient Location: Endoscopy Unit  Anesthesia Type:MAC  Level of Consciousness: awake, alert , oriented and patient cooperative  Airway & Oxygen Therapy: Patient Spontanous Breathing and Patient connected to nasal cannula oxygen  Post-op Assessment: Report given to RN, Post -op Vital signs reviewed and stable and Patient moving all extremities  Post vital signs: Reviewed and stable  Last Vitals:  Filed Vitals:   01/31/15 1016  BP: 158/90  Resp: 16    Complications: No apparent anesthesia complications

## 2015-01-31 NOTE — Discharge Instructions (Signed)
Electrical Cardioversion, Care After °Refer to this sheet in the next few weeks. These instructions provide you with information on caring for yourself after your procedure. Your health care provider may also give you more specific instructions. Your treatment has been planned according to current medical practices, but problems sometimes occur. Call your health care provider if you have any problems or questions after your procedure. °WHAT TO EXPECT AFTER THE PROCEDURE °After your procedure, it is typical to have the following sensations: °· Some redness on the skin where the shocks were delivered. If this is tender, a sunburn lotion or hydrocortisone cream may help. °· Possible return of an abnormal heart rhythm within hours or days after the procedure. °HOME CARE INSTRUCTIONS °· Take medicines only as directed by your health care provider. Be sure you understand how and when to take your medicine. °· Learn how to feel your pulse and check it often. °· Limit your activity for 48 hours after the procedure or as directed by your health care provider. °· Avoid or minimize caffeine and other stimulants as directed by your health care provider. °SEEK MEDICAL CARE IF: °· You feel like your heart is beating too fast or your pulse is not regular. °· You have any questions about your medicines. °· You have bleeding that will not stop. °SEEK IMMEDIATE MEDICAL CARE IF: °· You are dizzy or feel faint. °· It is hard to breathe or you feel short of breath. °· There is a change in discomfort in your chest. °· Your speech is slurred or you have trouble moving an arm or leg on one side of your body. °· You get a serious muscle cramp that does not go away. °· Your fingers or toes turn cold or blue. °Document Released: 08/19/2013 Document Revised: 03/15/2014 Document Reviewed: 08/19/2013 °[employer]® Patient Information ©2015 [employer]. This information is not intended to replace advice given to you by your health care provider.  Make sure you discuss any questions you have with your health care provider. ° ° °Conscious Sedation, Adult, Care After °Refer to this sheet in the next few weeks. These instructions provide you with information on caring for yourself after your procedure. Your health care provider may also give you more specific instructions. Your treatment has been planned according to current medical practices, but problems sometimes occur. Call your health care provider if you have any problems or questions after your procedure. °WHAT TO EXPECT AFTER THE PROCEDURE  °After your procedure: °· You may feel sleepy, clumsy, and have poor balance for several hours. °· Vomiting may occur if you eat too soon after the procedure. °HOME CARE INSTRUCTIONS °· Do not participate in any activities where you could become injured for at least 24 hours. Do not: °¨ Drive. °¨ Swim. °¨ Ride a bicycle. °¨ Operate heavy machinery. °¨ Cook. °¨ Use power tools. °¨ Climb ladders. °¨ Work from a high place. °· Do not make important decisions or sign legal documents until you are improved. °· If you vomit, drink water, juice, or soup when you can drink without vomiting. Make sure you have little or no nausea before eating solid foods. °· Only take over-the-counter or prescription medicines for pain, discomfort, or fever as directed by your health care provider. °· Make sure you and your family fully understand everything about the medicines given to you, including what side effects may occur. °· You should not drink alcohol, take sleeping pills, or take medicines that cause drowsiness for at least 24   hours. °· If you smoke, do not smoke without supervision. °· If you are feeling better, you may resume normal activities 24 hours after you were sedated. °· Keep all appointments with your health care provider. °SEEK MEDICAL CARE IF: °· Your skin is pale or bluish in color. °· You continue to feel nauseous or vomit. °· Your pain is getting worse and is not  helped by medicine. °· You have bleeding or swelling. °· You are still sleepy or feeling clumsy after 24 hours. °SEEK IMMEDIATE MEDICAL CARE IF: °· You develop a rash. °· You have difficulty breathing. °· You develop any type of allergic problem. °· You have a fever. °MAKE SURE YOU: °· Understand these instructions. °· Will watch your condition. °· Will get help right away if you are not doing well or get worse. °Document Released: 08/19/2013 Document Reviewed: 08/19/2013 °Exit[employer]® Patient Information ©2015 [employer]. This information is not intended to replace advice given to you by your health care provider. Make sure you discuss any questions you have with your health care provider. ° °

## 2015-01-31 NOTE — Op Note (Signed)
The patient was stable and ready for planned cardioversion. He has taken his anticoagulant very reliably.  All of the appropriate consent was discussed and signed. Timeout was done appropriately.  Anesthesia was present. Biphasic defibrillator was in place with anterior posterior pads.  Patient received 40 mg of IV lidocaine and 90 mg of IV propofol.  The patient was shocked with 120 J of biphasic energy. He remained in atrial fib. A second shock was given with 200 J of biphasic energy. He converted to sinus bradycardia and then sinus rhythm.  Patient tolerated the procedure well. Patient received 2 shocks. The second was successful in returning him to sinus rhythm.  Patient will be stable to return home with his helper today. He has a follow-up appointment in the office. He is to remain on his same medications.  Daryel November, MD

## 2015-01-31 NOTE — Anesthesia Postprocedure Evaluation (Signed)
  Anesthesia Post-op Note  Patient: Jared Whitaker  Procedure(s) Performed: Procedure(s): CARDIOVERSION (N/A)  Patient Location: Endoscopy Unit  Anesthesia Type:MAC  Level of Consciousness: awake, alert , oriented and patient cooperative  Airway and Oxygen Therapy: Patient Spontanous Breathing and Patient connected to nasal cannula oxygen  Post-op Pain: none  Post-op Assessment: Post-op Vital signs reviewed, Patient's Cardiovascular Status Stable, Respiratory Function Stable, Patent Airway and No signs of Nausea or vomiting  Post-op Vital Signs: Reviewed and stable  Last Vitals:  Filed Vitals:   01/31/15 1016  BP: 158/90  Resp: 16    Complications: No apparent anesthesia complications

## 2015-02-01 ENCOUNTER — Encounter (HOSPITAL_COMMUNITY): Payer: Self-pay | Admitting: Cardiology

## 2015-02-01 NOTE — H&P (Signed)
Cardiology Office Note   Date: 01/28/2015   ID: Jared Whitaker, DOB 1946/10/22, MRN [id number]  PCP: Jared Pac, MD Cardiologist: Jared Argyle, MD   Chief Complaint  Patient presents with  . Appointment    Follow-up atrial fibrillation     History of Present Illness: Jared Whitaker is a 69 y.o. male who presents today for follow-up of atrial fibrillation. I had cardioverted him last year and he held for many months. He then had return of atrial fibrillation. Xarelto was started. Also, I added flecainide 100 mg twice a day. He has been stable. However, he continues in atrial fibrillation. His rate is controlled. He does have symptoms with his atrial fibrillation.  In addition he is currently being evaluated for mild anemia. Historically there has not been proven GI bleeding.    Past Medical History  Diagnosis Date  . Colon polyp     Tubular adenoma- in colonoscopy in 07/2006 followed by GI Dr [name]  . Contact dermatitis   . Hyperlipidemia   . Osteoarthritis     of the shoulders  . Anemia   . Normal cardiac stress test   . Ejection fraction   . Shortness of breath   . Anemia     Past Surgical History  Procedure Laterality Date  . Colonoscopy    . Left leg fracture  1969  . Cardioversion N/A 03/03/2014    Procedure: CARDIOVERSION; Surgeon: Carlena Bjornstad, MD; Location: [facility] ENDOSCOPY; Service: Cardiovascular; Laterality: N/A;    Patient Active Problem List   Diagnosis Date Noted  . Encounter for monitoring flecainide therapy 01/28/2015  . HX: anticoagulation 02/16/2014  . Atrial fibrillation 01/15/2014  . DOE (dyspnea on exertion) 01/15/2014  . Normal cardiac stress test   . Ejection fraction   . Colon polyp   . Hyperlipidemia   . Anemia       Current Outpatient Prescriptions  Medication Sig Dispense Refill  . flecainide (TAMBOCOR)  100 MG tablet Take 1 tablet (100 mg total) by mouth 2 (two) times daily. 60 tablet 3  . rivaroxaban (XARELTO) 20 MG TABS tablet Take 1 tablet (20 mg total) by mouth daily with supper. 30 tablet 3   No current facility-administered medications for this visit.    Allergies: Penicillins    Social History: The patient  reports that he has never smoked. He does not have any smokeless tobacco history on file. He reports that he drinks alcohol. He reports that he does not use illicit drugs.   Family History: There is a family history of coronary artery disease.   ROS: Please see the history of present illness. Patient denies fever, chills, headache, sweats, rash, change in vision, change in hearing, chest pain, cough, nausea or vomiting, urinary symptoms. All other systems are reviewed and are negative.   PHYSICAL EXAM: VS: BP 146/74 mmHg  Pulse 68  Ht 5\' 7"  (1.702 m)  Wt 210 lb 6.4 oz (95.437 kg)  BMI 32.95 kg/m2 , Patient is stable. He is oriented to person time and place. Affect is normal. He is overweight. Head is atraumatic. Sclera and conjunctiva are normal. There is no jugulovenous distention. Lungs are clear. Respiratory effort is nonlabored. Cardiac exam reveals an S1 and S2. The rhythm is irregularly irregular. The rate is controlled. Abdomen is soft. There is no peripheral edema. There are no musculoskeletal deformities. There are no skin rashes.  EKG: EKG is done today and reviewed by me. There is atrial fibrillation. The rate is controlled. Recent Labs:  02/16/2014: BUN 14; Creatinine 1.0; Hemoglobin 12.1*; Platelets 228.0; Potassium 4.6; Sodium 139    Lipid Panel  Labs (Brief)    No results found for: CHOL, TRIG, HDL, CHOLHDL, VLDL, LDLCALC, LDLDIRECT     Wt Readings from Last 3 Encounters:  01/28/15 210 lb 6.4 oz (95.437 kg)  01/10/15 213 lb 12.8 oz (96.979 kg)  07/01/14 209 lb 1.9 oz (94.856 kg)      Current medicines are reviewed  The patient understands his medications.     ASSESSMENT AND PLAN:              Anemia - Carlena Bjornstad, MD at 01/28/2015 3:10 PM     Status: Written Related Problem: Anemia   Expand All Collapse All   There is history of a mild anemia. This is being evaluated by his primary team. There is no evidence that this is related to bleeding from xarelto.            Atrial fibrillation - Carlena Bjornstad, MD at 01/28/2015 3:11 PM     Status: Alison Stalling Related Problem: Atrial fibrillation   Expand All Collapse All   Patient has persistent atrial fibrillation. Flecainide has now been added. By the next week he will be fully anticoagulated for 21 days. It will be safe to proceed with cardioversion. This will be done on Monday, January 31, 2015.  As part of today's evaluation the patient and I talked at length about proceeding with cardioversion. We have called to schedule a cardioversion. I have written the orders for cardioversion. I've taken more than 25 minutes for his complete care. More than half of this time was related to discussing cardioversion with him.         Revision History       Date/Time User Action    > 01/28/2015 3:21 PM Carlena Bjornstad, MD Edit     01/28/2015 3:11 PM Carlena Bjornstad, MD Create              HX: anticoagulation - Carlena Bjornstad, MD at 01/28/2015 3:11 PM     Status: Written Related Problem: HX: anticoagulation   Expand All Collapse All   The patient was started back on xarelto for his atrial fibrillation. He will have been onboard for 21 days before his cardioversion is done next week.        Patient presents for cardioversion on January 31, 2015. There has been no change in the history or physical exam since the note of January 28, 2015. He is stable and we will proceed with cardioversion.  Daryel November, MD

## 2015-02-18 ENCOUNTER — Encounter: Payer: Self-pay | Admitting: Cardiology

## 2015-02-18 ENCOUNTER — Ambulatory Visit (INDEPENDENT_AMBULATORY_CARE_PROVIDER_SITE_OTHER): Payer: Medicare Other | Admitting: Cardiology

## 2015-02-18 VITALS — BP 103/72 | HR 55 | Ht 67.5 in | Wt 208.6 lb

## 2015-02-18 DIAGNOSIS — I480 Paroxysmal atrial fibrillation: Secondary | ICD-10-CM | POA: Diagnosis not present

## 2015-02-18 DIAGNOSIS — Z9229 Personal history of other drug therapy: Secondary | ICD-10-CM

## 2015-02-18 DIAGNOSIS — I4891 Unspecified atrial fibrillation: Secondary | ICD-10-CM

## 2015-02-18 DIAGNOSIS — Z7901 Long term (current) use of anticoagulants: Secondary | ICD-10-CM | POA: Diagnosis not present

## 2015-02-18 NOTE — Patient Instructions (Addendum)
Your physician recommends that you continue on your current medications as directed. Please refer to the Current Medication list given to you today.   Your physician recommends that you keep your scheduled  follow-up appointment with Dr. Ron Parker.

## 2015-02-18 NOTE — Assessment & Plan Note (Signed)
He is holding sinus rhythm now after cardioversion on flecainide.

## 2015-02-18 NOTE — Progress Notes (Signed)
Cardiology Office Note   Date:  02/18/2015   ID:  Jared Whitaker, DOB [birth year], MRN [id number]  PCP:  Gennette Pac, MD  Cardiologist:  Dola Argyle, MD   Chief Complaint  Patient presents with  . Appointment    Follow-up atrial fibrillation      History of Present Illness: Jared Whitaker is a 69 y.o. male who presents today to follow-up recent cardioversion. On January 31, 2015, we proceeded with an outpatient cardioversion. He did not convert with 120 J. He converted with 200 J. Since that time he says that he has had some episodes of shortness of breath. He wasn't sure if his rhythm was regular or not. EKG today shows sinus rhythm with mild sinus bradycardia.    Past Medical History  Diagnosis Date  . Colon polyp     Tubular adenoma- in colonoscopy in 07/2006 followed by GI Dr [name]  . Contact dermatitis   . Hyperlipidemia   . Osteoarthritis     of the shoulders  . Anemia   . Normal cardiac stress test   . Ejection fraction   . Shortness of breath   . Anemia     Past Surgical History  Procedure Laterality Date  . Colonoscopy    . Left leg fracture  1969  . Cardioversion N/A 03/03/2014    Procedure: CARDIOVERSION;  Surgeon: Carlena Bjornstad, MD;  Location: [facility] ENDOSCOPY;  Service: Cardiovascular;  Laterality: N/A;  . Cardioversion N/A 01/31/2015    Procedure: CARDIOVERSION;  Surgeon: Carlena Bjornstad, MD;  Location: [facility] ENDOSCOPY;  Service: Cardiovascular;  Laterality: N/A;    Patient Active Problem List   Diagnosis Date Noted  . Encounter for monitoring flecainide therapy 01/28/2015  . HX: anticoagulation 02/16/2014  . Atrial fibrillation 01/15/2014  . DOE (dyspnea on exertion) 01/15/2014  . Normal cardiac stress test   . Ejection fraction   . Colon polyp   . Hyperlipidemia   . Anemia       Current Outpatient Prescriptions  Medication Sig Dispense Refill  . flecainide (TAMBOCOR) 100 MG tablet Take 1 tablet (100 mg total) by mouth 2 (two) times daily. 60  tablet 3  . rivaroxaban (XARELTO) 20 MG TABS tablet Take 1 tablet (20 mg total) by mouth daily with supper. 30 tablet 3   No current facility-administered medications for this visit.    Allergies:   Penicillins    Social History:  The patient  reports that he has never smoked. He does not have any smokeless tobacco history on file. He reports that he drinks alcohol. He reports that he does not use illicit drugs.   Family History:  The patient's family history includes Bladder Cancer in his father; CVA in his mother; Heart attack in his mother.    ROS:  Please see the history of present illness.     Patient denies fever, chills, headache, sweats, rash, change in vision, change in hearing, chest pain, cough, nausea or vomiting, urinary symptoms. All other systems are reviewed and are negative.   PHYSICAL EXAM: VS:  BP 103/72 mmHg  Pulse 55  Ht 5' 7.5" (1.715 m)  Wt 208 lb 9.6 oz (94.62 kg)  BMI 32.17 kg/m2 , Patient is oriented to person time and place. Affect is normal. Head is atraumatic. Sclera and conjunctiva are normal. There is no jugular venous distention. Lungs are clear. Respiratory effort is nonlabored. Cardiac exam reveals an S1 and S2. The abdomen is soft. There is no peripheral edema.  EKG:  EKG is done today and reviewed by me. He is holding sinus rhythm with mild sinus bradycardia. His QT interval is normal.   Recent Labs: 01/28/2015: BUN 17; Creatinine 1.13; Potassium 4.0; Sodium 136    Lipid Panel No results found for: CHOL, TRIG, HDL, CHOLHDL, VLDL, LDLCALC, LDLDIRECT    Wt Readings from Last 3 Encounters:  02/18/15 208 lb 9.6 oz (94.62 kg)  01/28/15 210 lb 6.4 oz (95.437 kg)  01/10/15 213 lb 12.8 oz (96.979 kg)      Current medicines are reviewed  He understands his medications.     ASSESSMENT AND PLAN:

## 2015-02-18 NOTE — Assessment & Plan Note (Signed)
He was recently cardioverted. Anticoagulation will be continued.

## 2015-03-07 ENCOUNTER — Telehealth: Payer: Self-pay | Admitting: Hematology

## 2015-03-07 NOTE — Telephone Encounter (Signed)
new patient appt-s/w patient and gave np appt for 05/23 @ 10:30 w/Dr. Burr Medico Referring Dr. Acquanetta Sit Dx-Anemia

## 2015-04-01 ENCOUNTER — Encounter: Payer: Self-pay | Admitting: Cardiology

## 2015-04-01 ENCOUNTER — Ambulatory Visit (INDEPENDENT_AMBULATORY_CARE_PROVIDER_SITE_OTHER): Payer: Medicare Other | Admitting: Cardiology

## 2015-04-01 VITALS — BP 156/84 | HR 76 | Ht 67.5 in | Wt 213.0 lb

## 2015-04-01 DIAGNOSIS — Z7901 Long term (current) use of anticoagulants: Secondary | ICD-10-CM | POA: Diagnosis not present

## 2015-04-01 DIAGNOSIS — Z5181 Encounter for therapeutic drug level monitoring: Secondary | ICD-10-CM

## 2015-04-01 DIAGNOSIS — R0609 Other forms of dyspnea: Secondary | ICD-10-CM | POA: Diagnosis not present

## 2015-04-01 DIAGNOSIS — I480 Paroxysmal atrial fibrillation: Secondary | ICD-10-CM

## 2015-04-01 DIAGNOSIS — Z9229 Personal history of other drug therapy: Secondary | ICD-10-CM

## 2015-04-01 DIAGNOSIS — R0600 Dyspnea, unspecified: Secondary | ICD-10-CM

## 2015-04-01 DIAGNOSIS — Z79899 Other long term (current) drug therapy: Secondary | ICD-10-CM

## 2015-04-01 NOTE — Progress Notes (Signed)
Cardiology Office Note   Date:  04/01/2015   ID:  Jared Whitaker, DOB 01-13-1946, MRN [id number]  PCP:  Jared Pac, MD  Cardiologist:  Jared Argyle, MD   Chief Complaint  Patient presents with  . Appointment    Follow-up atrial fibrillation      History of Present Illness: Jared Whitaker is a 69 y.o. male who presents today to follow-up paroxysmal atrial fibrillation. Initially I had cardioverted him on Xarelto only. Over time he reverted to atrial fib. I then placed him on flecainide 100 mg by mouth twice a day. He was then cardioverted. He held for a few months. However today he returns in atrial fibrillation. In addition, he had an episode recently after sitting at his desk of feeling lightheaded when walking. He did not have syncope or presyncope. I cannot tell at this could be related to his atrial fibrillation with slowing, or converting in and out of atrial fib.    Past Medical History  Diagnosis Date  . Colon polyp     Tubular adenoma- in colonoscopy in 07/2006 followed by GI Dr Jared Whitaker  . Contact dermatitis   . Hyperlipidemia   . Osteoarthritis     of the shoulders  . Anemia   . Normal cardiac stress test   . Ejection fraction   . Shortness of breath   . Anemia     Past Surgical History  Procedure Laterality Date  . Colonoscopy    . Left leg fracture  1969  . Cardioversion N/A 03/03/2014    Procedure: CARDIOVERSION;  Surgeon: Jared Bjornstad, MD;  Location: [facility] ENDOSCOPY;  Service: Cardiovascular;  Laterality: N/A;  . Cardioversion N/A 01/31/2015    Procedure: CARDIOVERSION;  Surgeon: Jared Bjornstad, MD;  Location: [facility] ENDOSCOPY;  Service: Cardiovascular;  Laterality: N/A;    Patient Active Problem List   Diagnosis Date Noted  . Encounter for monitoring flecainide therapy 01/28/2015  . HX: anticoagulation 02/16/2014  . Paroxysmal atrial fibrillation 01/15/2014  . DOE (dyspnea on exertion) 01/15/2014  . Normal cardiac stress test   . Ejection fraction     . Colon polyp   . Hyperlipidemia   . Anemia       Current Outpatient Prescriptions  Medication Sig Dispense Refill  . flecainide (TAMBOCOR) 100 MG tablet Take 1 tablet (100 mg total) by mouth 2 (two) times daily. 60 tablet 3  . rivaroxaban (XARELTO) 20 MG TABS tablet Take 1 tablet (20 mg total) by mouth daily with supper. 30 tablet 3   No current facility-administered medications for this visit.    Allergies:   Penicillins    Social History:  The patient  reports that he has never smoked. He does not have any smokeless tobacco history on file. He reports that he drinks alcohol. He reports that he does not use illicit drugs.   Family History:  The patient'sfamily history includes Bladder Cancer in his father; CVA in his mother; Heart attack in his mother.    ROS:  Please see the history of present illness.  Patient denies fever, chills, headache, sweats, rash, change in vision, change in hearing, chest pain, cough, nausea vomiting, urinary symptoms. All other systems are reviewed and are negative.     PHYSICAL EXAM: VS:  BP 156/84 mmHg  Pulse 76  Ht 5' 7.5" (1.715 m)  Wt 213 lb (96.616 kg)  BMI 32.85 kg/m2  SpO2 96% , patient is oriented to person time and place. Affect is normal. Head is atraumatic.  Sclerae and conjunctiva are normal. There is no jugulovenous distention. Lungs are clear. Respiratory effort is not labored. Cardiac exam reveals an S1 and S2. The rhythm is irregularly irregular. The rate is controlled. Abdomen is soft. There is no peripheral edema. There are no musculoskeletal deformities. There are no skin rashes.  EKG:   EKG is done today and reviewed by me. There is atrial fibrillation. The rate is controlled.   Recent Labs: 01/28/2015: BUN 17; Creatinine 1.13; Potassium 4.0; Sodium 136    Lipid Panel No results found for: CHOL, TRIG, HDL, CHOLHDL, VLDL, LDLCALC, LDLDIRECT    Wt Readings from Last 3 Encounters:  04/01/15 213 lb (96.616 kg)  02/18/15 208  lb 9.6 oz (94.62 kg)  01/28/15 210 lb 6.4 oz (95.437 kg)      Current medicines are reviewed  The patient understands his medications.     ASSESSMENT AND PLAN:

## 2015-04-01 NOTE — Patient Instructions (Signed)
**Note De-Identified Iden Stripling Obfuscation** Medication Instructions:  Same  Labwork: Today  Testing/Procedures: None  Follow-Up: Your physician wants you to follow-up in: 4 months. You will receive a reminder letter in the mail two months in advance. If you don't receive a letter, please call our office to schedule the follow-up appointment.   Any Other Special Instructions Will Be Listed Below (If Applicable).

## 2015-04-01 NOTE — Assessment & Plan Note (Signed)
The patient has a mild anemia that is being assessed by GI and by hematology.

## 2015-04-01 NOTE — Assessment & Plan Note (Signed)
I have chosen to use anticoagulation around the time of his cardioversions. His overall risk score is low. However, I feel anticoagulation is appropriate.

## 2015-04-01 NOTE — Assessment & Plan Note (Signed)
He will remain on flecainide 100 mg twice a day at this time. We will check a trough flecainide level for more information.

## 2015-04-01 NOTE — Assessment & Plan Note (Addendum)
The patient has now undergone cardioversions on 2 occasions. The first was on no antiarrhythmic. He reverted to atrial fib. He was then loaded with flecainide carefully. Cardioversion was repeated. It took 2 shocks. He did hold for several weeks but he is now back in atrial fib. I feel it is now the appropriate time to refer him to Dr. [name].  for possible atrial fib ablation. He will remain on his current drugs. I will check a trough flecainide level. I am concerned about the episode of some lightheadedness that he had when sitting his computer and walking through his house. This may have been orthostatic. However I cannot rule out the possibility of a bradycardia or tachyarrhythmia.  As part of today's evaluation is greater than 25 minutes with his total care. More than half of this has been spent with direct discussion concerning the full approach to atrial fibrillation and why it is now appropriate to consider atrial fibrillation ablation.

## 2015-04-01 NOTE — Assessment & Plan Note (Signed)
He has dyspnea on exertion at times. It is still difficult to know if this is related to his atrial fibrillation or not.

## 2015-04-04 ENCOUNTER — Other Ambulatory Visit: Payer: Medicare Other

## 2015-04-04 ENCOUNTER — Telehealth: Payer: Self-pay | Admitting: Hematology

## 2015-04-04 ENCOUNTER — Ambulatory Visit (HOSPITAL_BASED_OUTPATIENT_CLINIC_OR_DEPARTMENT_OTHER): Payer: Medicare Other | Admitting: Hematology

## 2015-04-04 ENCOUNTER — Encounter: Payer: Self-pay | Admitting: Hematology

## 2015-04-04 ENCOUNTER — Ambulatory Visit (HOSPITAL_BASED_OUTPATIENT_CLINIC_OR_DEPARTMENT_OTHER): Payer: Medicare Other

## 2015-04-04 ENCOUNTER — Ambulatory Visit: Payer: Medicare Other

## 2015-04-04 VITALS — BP 129/69 | HR 59 | Temp 98.4°F | Resp 19 | Ht 67.5 in | Wt 212.5 lb

## 2015-04-04 DIAGNOSIS — D649 Anemia, unspecified: Secondary | ICD-10-CM

## 2015-04-04 DIAGNOSIS — I4891 Unspecified atrial fibrillation: Secondary | ICD-10-CM

## 2015-04-04 DIAGNOSIS — Z7901 Long term (current) use of anticoagulants: Secondary | ICD-10-CM

## 2015-04-04 LAB — CBC & DIFF AND RETIC
BASO%: 0.7 % (ref 0.0–2.0)
BASOS ABS: 0 10*3/uL (ref 0.0–0.1)
EOS%: 5.6 % (ref 0.0–7.0)
Eosinophils Absolute: 0.3 10*3/uL (ref 0.0–0.5)
HCT: 37 % — ABNORMAL LOW (ref 38.4–49.9)
HGB: 12.5 g/dL — ABNORMAL LOW (ref 13.0–17.1)
IMMATURE RETIC FRACT: 4 % (ref 3.00–10.60)
LYMPH#: 2.4 10*3/uL (ref 0.9–3.3)
LYMPH%: 42.9 % (ref 14.0–49.0)
MCH: 32 pg (ref 27.2–33.4)
MCHC: 33.8 g/dL (ref 32.0–36.0)
MCV: 94.6 fL (ref 79.3–98.0)
MONO#: 0.6 10*3/uL (ref 0.1–0.9)
MONO%: 9.9 % (ref 0.0–14.0)
NEUT%: 40.9 % (ref 39.0–75.0)
NEUTROS ABS: 2.3 10*3/uL (ref 1.5–6.5)
Platelets: 252 10*3/uL (ref 140–400)
RBC: 3.91 10*6/uL — ABNORMAL LOW (ref 4.20–5.82)
RDW: 12.5 % (ref 11.0–14.6)
RETIC %: 1.68 % (ref 0.80–1.80)
RETIC CT ABS: 65.69 10*3/uL (ref 34.80–93.90)
WBC: 5.5 10*3/uL (ref 4.0–10.3)

## 2015-04-04 LAB — COMPREHENSIVE METABOLIC PANEL (CC13)
ALT: 14 U/L (ref 0–55)
ANION GAP: 9 meq/L (ref 3–11)
AST: 17 U/L (ref 5–34)
Albumin: 3.9 g/dL (ref 3.5–5.0)
Alkaline Phosphatase: 77 U/L (ref 40–150)
BUN: 16.8 mg/dL (ref 7.0–26.0)
CO2: 23 meq/L (ref 22–29)
CREATININE: 1.1 mg/dL (ref 0.7–1.3)
Calcium: 8.9 mg/dL (ref 8.4–10.4)
Chloride: 109 mEq/L (ref 98–109)
EGFR: 70 mL/min/{1.73_m2} — AB (ref >90)
GLUCOSE: 96 mg/dL (ref 70–140)
Potassium: 4.6 mEq/L (ref 3.5–5.1)
SODIUM: 140 meq/L (ref 136–145)
TOTAL PROTEIN: 7 g/dL (ref 6.4–8.3)
Total Bilirubin: 0.47 mg/dL (ref 0.20–1.20)

## 2015-04-04 LAB — MORPHOLOGY
PLT EST: ADEQUATE
RBC Comments: NORMAL

## 2015-04-04 LAB — TSH CHCC: TSH: 1.942 m(IU)/L (ref 0.320–4.118)

## 2015-04-04 LAB — LACTATE DEHYDROGENASE (CC13): LDH: 161 U/L (ref 125–245)

## 2015-04-04 NOTE — Progress Notes (Signed)
Checked in new pt with no financial concerns. °

## 2015-04-04 NOTE — Telephone Encounter (Signed)
Gave patient avs report and appointment for June. No pof sent to the scheduling for 5/23 visit. pof obtained from office not. Patient sent back to lab per pof attached to office note. Message to [place] re not receiving pof.

## 2015-04-04 NOTE — Progress Notes (Addendum)
[place]  Telephone:([phone]) [phone] Fax:([phone]) [place] consult Note   Patient Care Team: [name], MD as PCP - General (Family Medicine) 04/04/2015  CHIEF COMPLAINTS/PURPOSE OF CONSULTATION:  Anemia  HISTORY OF PRESENTING ILLNESS:    Jared Whitaker 69 y.o. male with PMH of AF, colon polyps, is here because of anemia.   He was found to have abnormal CBC from 1.5 years ago, CBC showed hemoglobin 12.8, hematocrit 30.8, normal MCV. He was referred to gastroenterologist Dr. [name], stool OB was negative, iron study was normal. His repeat a CBC in March 2016 showed hemoglobin 12.6, hematocrit 38.7. He was again sent to see Dr. [name], and repeated CBC on 02/16/2014 showed Hb 12.1, hematocrit 35.6, MCV 95. Serum iron 90, TIBC 308, transferrin 009, folic acid 23.3, vitamin B-12 472. Those are all within normal limits.   He denies recent chest pain on exertion, he has mild intermittent shortness of breath on heavy exertion, no pre-syncopal episodes, or palpitations. He had not noticed any recent bleeding such as epistaxis, hematuria or hematochezia The patient denies over the counter NSAID ingestion. He is not on antiplatelets agents. His last colonoscopy was 2013.  He had no prior history or diagnosis of cancer. His age appropriate screening programs are up-to-date. He denies any pica and eats a variety of diet. He never donated blood or received blood transfusion The patient has not taken any iron supplement. He lives alone, does not spent much time cooking, eats most processed meat, and frozen vegetables. He does not take vitamins.  MEDICAL HISTORY:  Past Medical History  Diagnosis Date  . Colon polyp     Tubular adenoma- in colonoscopy in 07/2006 followed by GI Dr [name]  . Contact dermatitis   . Hyperlipidemia   . Osteoarthritis     of the shoulders  . Anemia   . Normal cardiac stress test   . Ejection fraction   . Shortness of breath   . Anemia      SURGICAL HISTORY: Past Surgical History  Procedure Laterality Date  . Colonoscopy    . Left leg fracture  1969  . Cardioversion N/A 03/03/2014    Procedure: CARDIOVERSION;  Surgeon: Carlena Bjornstad, MD;  Location: [facility] ENDOSCOPY;  Service: Cardiovascular;  Laterality: N/A;  . Cardioversion N/A 01/31/2015    Procedure: CARDIOVERSION;  Surgeon: Carlena Bjornstad, MD;  Location: [facility] ENDOSCOPY;  Service: Cardiovascular;  Laterality: N/A;    SOCIAL HISTORY: History   Social History  . Marital Status: Single    Spouse Name: N/A  . Number of Children: N/A  . Years of Education: N/A   Occupational History  . Not on file.   Social History Main Topics  . Smoking status: Never Smoker   . Smokeless tobacco: Not on file  . Alcohol Use: 0.0 oz/week     Comment: 3 six packs a week  . Drug Use: No  . Sexual Activity: Not on file   Other Topics Concern  . Not on file   Social History Narrative    FAMILY HISTORY: Family History  Problem Relation Age of Onset  . CVA Mother   . Heart attack Mother   . Bladder Cancer Father   . Cancer Father     bladder cancer     ALLERGIES:  is allergic to penicillins.  MEDICATIONS:  Current Outpatient Prescriptions  Medication Sig Dispense Refill  . flecainide (TAMBOCOR) 100 MG tablet Take 1 tablet (100 mg total) by mouth 2 (  two) times daily. 60 tablet 3  . rivaroxaban (XARELTO) 20 MG TABS tablet Take 1 tablet (20 mg total) by mouth daily with supper. 30 tablet 3   No current facility-administered medications for this visit.    REVIEW OF SYSTEMS:   Constitutional: Denies fevers, chills or abnormal night sweats Eyes: Denies blurriness of vision, double vision or watery eyes Ears, nose, mouth, throat, and face: Denies mucositis or sore throat Respiratory: Denies cough, dyspnea or wheezes Cardiovascular: Denies palpitation, chest discomfort or lower extremity swelling Gastrointestinal:  Denies nausea, heartburn or change in bowel habits Skin:  Denies abnormal skin rashes Lymphatics: Denies new lymphadenopathy or easy bruising Neurological:Denies numbness, tingling or new weaknesses Behavioral/Psych: Mood is stable, no new changes  All other systems were reviewed with the patient and are negative.  PHYSICAL EXAMINATION: ECOG PERFORMANCE STATUS: 0 - Asymptomatic  Filed Vitals:   04/04/15 1038  BP: 129/69  Pulse: 59  Temp: 98.4 F (36.9 C)  Resp: 19   Filed Weights   04/04/15 1038  Weight: 212 lb 8 oz (96.389 kg)    GENERAL:alert, no distress and comfortable SKIN: skin color, texture, turgor are normal, no rashes or significant lesions EYES: normal, conjunctiva are pink and non-injected, sclera clear OROPHARYNX:no exudate, no erythema and lips, buccal mucosa, and tongue normal  NECK: supple, thyroid normal size, non-tender, without nodularity LYMPH:  no palpable lymphadenopathy in the cervical, axillary or inguinal LUNGS: clear to auscultation and percussion with normal breathing effort HEART: regular rate & rhythm and no murmurs and no lower extremity edema ABDOMEN:abdomen soft, non-tender and normal bowel sounds Musculoskeletal:no cyanosis of digits and no clubbing  PSYCH: alert & oriented x 3 with fluent speech NEURO: no focal motor/sensory deficits  LABORATORY DATA:  I have reviewed the data as listed CBC Latest Ref Rng 02/16/2014  WBC 4.5 - 10.5 K/uL 5.8  Hemoglobin 13.0 - 17.0 g/dL 12.1(L)  Hematocrit 39.0 - 52.0 % 35.6(L)  Platelets 150.0 - 400.0 K/uL 228.0    CMP Latest Ref Rng 01/28/2015 02/16/2014  Glucose 70 - 99 mg/dL 100(H) 84  BUN 6 - 23 mg/dL 17 14  Creatinine 0.40 - 1.50 mg/dL 1.13 1.0  Sodium 135 - 145 mEq/L 136 139  Potassium 3.5 - 5.1 mEq/L 4.0 4.6  Chloride 96 - 112 mEq/L 103 107  CO2 19 - 32 mEq/L 30 28  Calcium 8.4 - 10.5 mg/dL 9.4 9.2     RADIOGRAPHIC STUDIES: I have personally reviewed the radiological images as listed and agreed with the findings in the report.  CT abdomen and  pelvis with contrast on 09/18/2013 IMPRESSION: No acute abdominal/ pelvic findings, mass lesions or lymphadenopathy.  Mild diffuse fatty infiltration of the liver.  Scattered atherosclerotic calcifications involving the aorta and branch vessels.  ECHO 01/20/2014  Study Conclusions  - Left ventricle: Systolic function was normal. The estimated ejection fraction was in the range of 55% to 60%. - Mitral valve: Mild regurgitation. - Left atrium: The atrium was mildly dilated. - Atrial septum: No defect or patent foramen ovale was identified.  ASSESSMENT & PLAN:  69 year old Caucasian male, with past medical history of atrial fibrillation, on Xarelto and flecainide, presents with mild normocytic anemia for a few years.  1. Mild normocytic anemia -His hemoglobin has been in the range of 12-12.8g/d in the past few years, with normal MCV and absolute reticular count. -Iron study and folic acid, G38 were normal. -I discussed the possible etiology of his anemia, such as anemia of nutrition deficiency,  anemia of chronic disease, hypothyroidism, hypogonadism, bone marrow disease, such as MDS, multiple myeloma, lymphoma etc. -I'll check peripheral smear, LDH, SPEP with immunofixation, serum light chain levels, TSH, to dust from level, erythropoietin level, heavy metal levels. methylmalonic acid level. -If his above test results are negative, his anemia could be related to normal aging process, or low-grade MDS, medication flecainide related anemia is also possible, although rare -Giving his mild degree of anemia, and asymptomatic, I do not feel he needs a bone marrow biopsy at this point. We'll observe him closely, bone marrow biopsy may be necessary if his anemia gets worse, or peripheral smear shows suspicion for marrow disease. -I suggest him to take multivitamin once daily, try to eat more fresh vegetable and meat.   2 atrial fibrillation -He will continue follow-up with his  cardiologist.  Follow-up: Lab today. I'll see him back in 1 months.  All questions were answered. The patient knows to call the clinic with any problems, questions or concerns. I spent 40 minutes counseling the patient face to face. The total time spent in the appointment was 55 minutes and more than 50% was on counseling.     Truitt Merle, MD 04/04/2015 11:29 AM

## 2015-04-05 LAB — FERRITIN CHCC: FERRITIN: 263 ng/mL (ref 22–316)

## 2015-04-07 LAB — HEAVY METALS, BLOOD
Arsenic: 14 mcg/L (ref <23)
Lead: <2 ug/dL (ref <10)

## 2015-04-07 LAB — KAPPA/LAMBDA LIGHT CHAINS
KAPPA LAMBDA RATIO: 1.62 (ref 0.26–1.65)
Kappa free light chain: 1.98 mg/dL — ABNORMAL HIGH (ref 0.33–1.94)
Lambda Free Lght Chn: 1.22 mg/dL (ref 0.57–2.63)

## 2015-04-07 LAB — TESTOSTERONE, FREE, TOTAL, SHBG
Sex Hormone Binding: 43 nmol/L (ref 22–77)
TESTOSTERONE: 314 ng/dL (ref 300–890)
Testosterone, Free: 52.5 pg/mL (ref 47.0–244.0)
Testosterone-% Free: 1.7 % (ref 1.6–2.9)

## 2015-04-07 LAB — METHYLMALONIC ACID, SERUM: Methylmalonic Acid, Quant: 76 nmol/L — ABNORMAL LOW (ref 87–318)

## 2015-04-07 LAB — SPEP & IFE WITH QIG
ALPHA-1-GLOBULIN: 0.2 g/dL (ref 0.2–0.3)
Albumin ELP: 4.2 g/dL (ref 3.8–4.8)
Alpha-2-Globulin: 0.7 g/dL (ref 0.5–0.9)
Beta 2: 0.4 g/dL (ref 0.2–0.5)
Beta Globulin: 0.4 g/dL (ref 0.4–0.6)
Gamma Globulin: 1.1 g/dL (ref 0.8–1.7)
IGG (IMMUNOGLOBIN G), SERUM: 1250 mg/dL (ref 650–1600)
IgA: 224 mg/dL (ref 68–379)
IgM, Serum: 224 mg/dL (ref 41–251)
Total Protein, Serum Electrophoresis: 7.1 g/dL (ref 6.1–8.1)

## 2015-04-07 LAB — ERYTHROPOIETIN: ERYTHROPOIETIN: 7 m[IU]/mL (ref 2.6–18.5)

## 2015-04-08 ENCOUNTER — Other Ambulatory Visit: Payer: Self-pay | Admitting: Cardiology

## 2015-04-08 ENCOUNTER — Other Ambulatory Visit (INDEPENDENT_AMBULATORY_CARE_PROVIDER_SITE_OTHER): Payer: Medicare Other | Admitting: *Deleted

## 2015-04-08 DIAGNOSIS — I480 Paroxysmal atrial fibrillation: Secondary | ICD-10-CM

## 2015-04-08 DIAGNOSIS — Z5181 Encounter for therapeutic drug level monitoring: Secondary | ICD-10-CM

## 2015-04-08 DIAGNOSIS — Z79899 Other long term (current) drug therapy: Secondary | ICD-10-CM

## 2015-04-08 NOTE — Addendum Note (Signed)
Addended by: Eulis Foster on: 04/08/2015 09:15 AM   Modules accepted: Orders

## 2015-04-12 ENCOUNTER — Encounter: Payer: Self-pay | Admitting: *Deleted

## 2015-04-14 ENCOUNTER — Encounter: Payer: Self-pay | Admitting: Internal Medicine

## 2015-04-14 ENCOUNTER — Ambulatory Visit (INDEPENDENT_AMBULATORY_CARE_PROVIDER_SITE_OTHER): Payer: Medicare Other | Admitting: Internal Medicine

## 2015-04-14 VITALS — BP 140/76 | HR 58 | Ht 67.5 in | Wt 212.2 lb

## 2015-04-14 DIAGNOSIS — G4733 Obstructive sleep apnea (adult) (pediatric): Secondary | ICD-10-CM

## 2015-04-14 DIAGNOSIS — I481 Persistent atrial fibrillation: Secondary | ICD-10-CM | POA: Diagnosis not present

## 2015-04-14 DIAGNOSIS — I480 Paroxysmal atrial fibrillation: Secondary | ICD-10-CM

## 2015-04-14 DIAGNOSIS — D649 Anemia, unspecified: Secondary | ICD-10-CM

## 2015-04-14 DIAGNOSIS — I4819 Other persistent atrial fibrillation: Secondary | ICD-10-CM

## 2015-04-14 LAB — FLECAINIDE LEVEL: Flecainide: 0.35 ug/mL (ref 0.20–1.00)

## 2015-04-14 NOTE — Patient Instructions (Addendum)
Medication Instructions:  Your physician has recommended you make the following change in your medication:  1) Stop Flecainide   Labwork: None ordered  Testing/Procedures: The problem of recurrent insomnia is discussed. Avoidance of caffeine sources is strongly encouraged. Sleep hygiene issues are reviewed. The use of sedative hypnotics for temporary relief is appropriate; we discussed the addictive nature of these drugs, and a one-time only prescription for prn use of a hypnotic is given, to use no more than 3 times per week for 2-3 weeks.   Follow-Up: Your physician recommends that you schedule a follow-up appointment in: 3 months with Dr [name]   Any Other Special Instructions Will Be Listed Below (If Applicable).

## 2015-04-14 NOTE — Progress Notes (Signed)
Electrophysiology Office Note   Date:  04/14/2015   ID:  Jared Whitaker, DOB [birth year], MRN [id number]  PCP:  Gennette Pac, MD  Cardiologist:  Dr Ron Parker Primary Electrophysiologist: Thompson Grayer, MD    Chief Complaint  Patient presents with  . Atrial Fibrillation     History of Present Illness: Jared Whitaker is a 69 y.o. male who presents today for electrophysiology evaluation.   The patient has persistent atrial fibrillation.  He reports initially being diagnosed with atrial fibrillation over a year ago after presenting with symptoms of fatigue and decreased exercise tolerance.  He underwent cardioversion 03/03/14 with improvement in symptoms.  He did very well until December when he developed recurrent atrial fibrillation.  He was placed on flecainide and again underwent cardioversion 01/31/15.  He thinks that he quickly returned to afib and does not recall any real symptomatic improvement.  He has been in afib since that time.  V rates have been controlled.  He is chronically anticoagulated with xarelto.  He has had several episodes of dizziness but not presyncope/ syncope.  He has chronic issues with anemia but has not had bleeding.  Today, he denies symptoms of palpitations, chest pain, shortness of breath, orthopnea, PND, lower extremity edema, claudication, dizziness, bleeding, or neurologic sequela. The patient is tolerating medications without difficulties and is otherwise without complaint today.    Past Medical History  Diagnosis Date  . Colon polyp     Tubular adenoma- in colonoscopy in 07/2006 followed by GI Dr [name]  . Contact dermatitis   . Hyperlipidemia   . Osteoarthritis     of the shoulders  . Anemia   . Normal cardiac stress test   . Persistent atrial fibrillation     chads2vasc score is 2  . Shortness of breath   . Anemia   . Snoring   . Overweight   . Hypertension    Past Surgical History  Procedure Laterality Date  . Colonoscopy    . Left leg  fracture  1969  . Cardioversion N/A 03/03/2014    Procedure: CARDIOVERSION;  Surgeon: Carlena Bjornstad, MD;  Location: [facility] ENDOSCOPY;  Service: Cardiovascular;  Laterality: N/A;  . Cardioversion N/A 01/31/2015    Procedure: CARDIOVERSION;  Surgeon: Carlena Bjornstad, MD;  Location: [facility] ENDOSCOPY;  Service: Cardiovascular;  Laterality: N/A;     Current Outpatient Prescriptions  Medication Sig Dispense Refill  . rivaroxaban (XARELTO) 20 MG TABS tablet Take 1 tablet (20 mg total) by mouth daily with supper. 30 tablet 3   No current facility-administered medications for this visit.    Allergies:   Penicillins   Social History:  The patient  reports that he has never smoked. He does not have any smokeless tobacco history on file. He reports that he drinks alcohol. He reports that he does not use illicit drugs.   Family History:  The patient's  family history includes Bladder Cancer in his father; CVA in his mother; Heart attack in his father and mother.    ROS:  Please see the history of present illness.   All other systems are reviewed and negative.    PHYSICAL EXAM: VS:  BP 140/76 mmHg  Pulse 58  Ht 5' 7.5" (1.715 m)  Wt 96.253 kg (212 lb 3.2 oz)  BMI 32.73 kg/m2 , BMI Body mass index is 32.73 kg/(m^2). GEN: Well nourished, well developed, in no acute distress HEENT: normal Neck: no JVD, carotid bruits, or masses Cardiac: RRR; no murmurs, rubs, or gallops,no  edema  Respiratory:  clear to auscultation bilaterally, normal work of breathing GI: soft, nontender, nondistended, + BS MS: no deformity or atrophy Skin: warm and dry  Neuro:  Strength and sensation are intact Psych: euthymic mood, full affect  EKG:  EKG is ordered today. The ekg ordered today shows afib, V rates are somewhat regular at 58 bpm, RBBB   Recent Labs: 04/04/2015: ALT 14; BUN 16.8; Creatinine 1.1; Hemoglobin 12.5*; Platelets 252; Potassium 4.6; Sodium 140; TSH 1.942    Lipid Panel  No results found for: CHOL,  TRIG, HDL, CHOLHDL, VLDL, LDLCALC, LDLDIRECT   Wt Readings from Last 3 Encounters:  04/14/15 96.253 kg (212 lb 3.2 oz)  04/04/15 96.389 kg (212 lb 8 oz)  04/01/15 96.616 kg (213 lb)      Other studies Reviewed: Additional studies/ records that were reviewed today include: Dr Ron Parker notes, echo  Review of the above records today demonstrates: preserved EF, mild LA enlargement   ASSESSMENT AND PLAN:  1.  The patient has persistent atrial fibrillation.  He has only mild LA enlargement.  He has a chads2vasc score of 2 and is anticoagulated with xarelto.   He has failed medical therapy with flecainide.  I will therefore stop this medicine today. Therapeutic strategies for afib including medicine and ablation were discussed in detail with the patient today. Risk, benefits, and alternatives to EP study and radiofrequency ablation for afib were also discussed in detail today.  In addition, options of long term rate control, tikosyn, and amiodarone were discussed.  He is considering rate control vs ablation and would like to give it a few months to see how he does.  He is not ready to commit to a strategy at this time.   He has a chronic RBBB and is slow/ regular on exam today.  I am suspicious that he may have some degree of AV nodal disease.  I will stop flecainide as it has been ineffective at maintaining sinus.  Will need to be cautious with any AV nodal agents in the future. This is a very complicated issue for this patient and a high level of decision making is required.  2. Anemia Unclear etiology per pt, being evaluated by Hematology Today, I discussed left atrial appendage occlusion devices as an alternative to anticoagulation.  He is interested in further discussions of this once this is an available option in our area. Continue xarelto at this time.  3. Overweight Lifestyle modification encouraged Given snoring and body habitous, will order sleep study.  Return in 3 months for further  discussion of afib management. He will contact my office should problems arise in the interim.   Current medicines are reviewed at length with the patient today.   The patient does not have concerns regarding his medicines.  The following changes were made today:  none  Labs/ tests ordered today include:  Orders Placed This Encounter  Procedures  . EKG 12-Lead  . Split night study    Signed, Thompson Grayer, MD  04/14/2015 8:41 PM     [place] [address] [phone] (office) [phone] (fax)

## 2015-04-17 LAB — UIFE/LIGHT CHAINS/TP QN, 24-HR UR
ALPHA 1 UR: DETECTED — AB
Albumin, U: DETECTED
Alpha 2, Urine: DETECTED — AB
Beta, Urine: DETECTED — AB
Gamma Globulin, Urine: DETECTED — AB
TOTAL PROTEIN, URINE-UPE24: 4 mg/dL — AB (ref 5–25)
TOTAL PROTEIN, URINE-UR/DAY: 118 mg/d (ref <150)
Time: 24 hours
Volume, Urine: 2950 mL

## 2015-04-17 LAB — 24 HR URINE,KAPPA/LAMBDA LIGHT CHAINS: URINE VOLUME: 2950 mL/(24.h)

## 2015-05-02 ENCOUNTER — Ambulatory Visit (HOSPITAL_BASED_OUTPATIENT_CLINIC_OR_DEPARTMENT_OTHER): Payer: Medicare Other | Admitting: Hematology

## 2015-05-02 ENCOUNTER — Other Ambulatory Visit: Payer: Self-pay | Admitting: Cardiology

## 2015-05-02 ENCOUNTER — Encounter: Payer: Self-pay | Admitting: Hematology

## 2015-05-02 ENCOUNTER — Telehealth: Payer: Self-pay | Admitting: Hematology

## 2015-05-02 VITALS — BP 133/73 | HR 65 | Temp 98.7°F | Resp 18 | Ht 67.5 in | Wt 213.7 lb

## 2015-05-02 DIAGNOSIS — D649 Anemia, unspecified: Secondary | ICD-10-CM | POA: Diagnosis not present

## 2015-05-02 NOTE — Telephone Encounter (Signed)
Gave patient avs report and appointments for June 2017.

## 2015-05-02 NOTE — Progress Notes (Signed)
[facility]  Telephone:([phone]) [phone] Fax:([phone]) [place] consult Note   Patient Care Team: [name], MD as PCP - General (Family Medicine) Wonda Horner, MD as Consulting Physician (Gastroenterology) 05/02/2015  CHIEF COMPLAINTS/PURPOSE OF CONSULTATION:  Anemia  HISTORY OF PRESENTING ILLNESS:    Jared Whitaker 69 y.o. male with PMH of AF, colon polyps, is here because of anemia.   He was found to have abnormal CBC from 1.5 years ago, CBC showed hemoglobin 12.8, hematocrit 30.8, normal MCV. He was referred to gastroenterologist Dr. [name], stool OB was negative, iron study was normal. His repeat a CBC in March 2016 showed hemoglobin 12.6, hematocrit 38.7. He was again sent to see Dr. [name], and repeated CBC on 02/16/2014 showed Hb 12.1, hematocrit 35.6, MCV 95. Serum iron 90, TIBC 308, transferrin 962, folic acid 22.9, vitamin B-12 472. Those are all within normal limits.   He denies recent chest pain on exertion, he has mild intermittent shortness of breath on heavy exertion, no pre-syncopal episodes, or palpitations. He had not noticed any recent bleeding such as epistaxis, hematuria or hematochezia The patient denies over the counter NSAID ingestion. He is not on antiplatelets agents. His last colonoscopy was 2013.  He had no prior history or diagnosis of cancer. His age appropriate screening programs are up-to-date. He denies any pica and eats a variety of diet. He never donated blood or received blood transfusion The patient has not taken any iron supplement. He lives alone, does not spent much time cooking, eats most processed meat, and frozen vegetables. He does not take vitamins.  INTERIM HISTOPRY He returns for follow up.  He is doing well,  Has no complaints. He has good energy level , denies any chest pain or dyspnea.  MEDICAL HISTORY:  Past Medical History  Diagnosis Date  . Colon polyp     Tubular adenoma- in colonoscopy in 07/2006 followed  by GI Dr [name]  . Contact dermatitis   . Hyperlipidemia   . Osteoarthritis     of the shoulders  . Anemia   . Normal cardiac stress test   . Persistent atrial fibrillation     chads2vasc score is 2  . Shortness of breath   . Anemia   . Snoring   . Overweight   . Hypertension     SURGICAL HISTORY: Past Surgical History  Procedure Laterality Date  . Colonoscopy    . Left leg fracture  1969  . Cardioversion N/A 03/03/2014    Procedure: CARDIOVERSION;  Surgeon: Carlena Bjornstad, MD;  Location: [facility] ENDOSCOPY;  Service: Cardiovascular;  Laterality: N/A;  . Cardioversion N/A 01/31/2015    Procedure: CARDIOVERSION;  Surgeon: Carlena Bjornstad, MD;  Location: [facility] ENDOSCOPY;  Service: Cardiovascular;  Laterality: N/A;    SOCIAL HISTORY: History   Social History  . Marital Status: Single    Spouse Name: N/A  . Number of Children: N/A  . Years of Education: N/A   Occupational History  . Not on file.   Social History Main Topics  . Smoking status: Never Smoker   . Smokeless tobacco: Not on file  . Alcohol Use: 0.0 oz/week     Comment: 3 six packs a week  . Drug Use: No  . Sexual Activity: Not on file   Other Topics Concern  . Not on file   Social History Narrative   Lives in [place].  Works an a Nurse, adult.    FAMILY HISTORY: Family History  Problem Relation Age of Onset  . CVA Mother   . Heart attack Mother   . Heart attack Father     smoker  . Bladder Cancer Father   . Cancer Paternal Uncle     ALLERGIES:  is allergic to penicillins.  MEDICATIONS:  Current Outpatient Prescriptions  Medication Sig Dispense Refill  . XARELTO 20 MG TABS tablet TAKE 1 TABLET (20 MG TOTAL) BY MOUTH DAILY WITH SUPPER. 30 tablet 3   No current facility-administered medications for this visit.    REVIEW OF SYSTEMS:   Constitutional: Denies fevers, chills or abnormal night sweats Eyes: Denies blurriness of vision, double vision or watery eyes Ears, nose, mouth,  throat, and face: Denies mucositis or sore throat Respiratory: Denies cough, dyspnea or wheezes Cardiovascular: Denies palpitation, chest discomfort or lower extremity swelling Gastrointestinal:  Denies nausea, heartburn or change in bowel habits Skin: Denies abnormal skin rashes Lymphatics: Denies new lymphadenopathy or easy bruising Neurological:Denies numbness, tingling or new weaknesses Behavioral/Psych: Mood is stable, no new changes  All other systems were reviewed with the patient and are negative.  PHYSICAL EXAMINATION: ECOG PERFORMANCE STATUS: 0 - Asymptomatic  Filed Vitals:   05/02/15 1255  BP: 133/73  Pulse: 65  Temp: 98.7 F (37.1 C)  Resp: 18   Filed Weights   05/02/15 1255  Weight: 213 lb 11.2 oz (96.934 kg)    GENERAL:alert, no distress and comfortable SKIN: skin color, texture, turgor are normal, no rashes or significant lesions EYES: normal, conjunctiva are pink and non-injected, sclera clear OROPHARYNX:no exudate, no erythema and lips, buccal mucosa, and tongue normal  NECK: supple, thyroid normal size, non-tender, without nodularity LYMPH:  no palpable lymphadenopathy in the cervical, axillary or inguinal LUNGS: clear to auscultation and percussion with normal breathing effort HEART: regular rate & rhythm and no murmurs and no lower extremity edema ABDOMEN:abdomen soft, non-tender and normal bowel sounds Musculoskeletal:no cyanosis of digits and no clubbing  PSYCH: alert & oriented x 3 with fluent speech NEURO: no focal motor/sensory deficits  LABORATORY DATA:  I have reviewed the data as listed CBC Latest Ref Rng 04/04/2015 02/16/2014  WBC 4.0 - 10.3 10e3/uL 5.5 5.8  Hemoglobin 13.0 - 17.1 g/dL 12.5(L) 12.1(L)  Hematocrit 38.4 - 49.9 % 37.0(L) 35.6(L)  Platelets 140 - 400 10e3/uL 252 228.0    CMP Latest Ref Rng 04/04/2015 01/28/2015 02/16/2014  Glucose 70 - 140 mg/dl 96 100(H) 84  BUN 7.0 - 26.0 mg/dL 16.'8 17 14  ' Creatinine 0.7 - 1.3 mg/dL 1.1 1.13 1.0    Sodium 136 - 145 mEq/L 140 136 139  Potassium 3.5 - 5.1 mEq/L 4.6 4.0 4.6  Chloride 96 - 112 mEq/L - 103 107  CO2 22 - 29 mEq/L '23 30 28  ' Calcium 8.4 - 10.4 mg/dL 8.9 9.4 9.2  Total Protein 6.4 - 8.3 g/dL 7.0 - -  Total Bilirubin 0.20 - 1.20 mg/dL 0.47 - -  Alkaline Phos 40 - 150 U/L 77 - -  AST 5 - 34 U/L 17 - -  ALT 0 - 55 U/L 14 - -     RADIOGRAPHIC STUDIES: I have personally reviewed the radiological images as listed and agreed with the findings in the report.  CT abdomen and pelvis with contrast on 09/18/2013 IMPRESSION: No acute abdominal/ pelvic findings, mass lesions or lymphadenopathy.  Mild diffuse fatty infiltration of the liver.  Scattered atherosclerotic calcifications involving the aorta and branch vessels.  ECHO 01/20/2014  Study Conclusions  - Left ventricle: Systolic function  was normal. The estimated ejection fraction was in the range of 55% to 60%. - Mitral valve: Mild regurgitation. - Left atrium: The atrium was mildly dilated. - Atrial septum: No defect or patent foramen ovale was identified.  ASSESSMENT & PLAN:  69 year old Caucasian male, with past medical history of atrial fibrillation, on Xarelto and flecainide, presents with mild normocytic anemia for a few years.  1. Mild normocytic anemia -His hemoglobin has been in the range of 12-12.8g/d in the past few years, with normal MCV and absolute reticular count. -Iron study and folic acid, M60 were normal. -I discussed the possible etiology of his anemia, such as anemia of nutrition deficiency, anemia of chronic disease, hypothyroidism, hypogonadism, bone marrow disease, such as MDS, multiple myeloma, lymphoma etc. -His peripheral smear  Morphology was unremarkable,  normalLDH, SPEP and UPEP with immunofixation were negative, TSH, testosterone, erythropoietin level, and  heavy metal levels were normal, -his anemia could be related to normal aging process, or low-grade MDS, medication  flecainide related anemia is also possible, although rare - his previous CT and ultrasoundof the abdomen  Showed unremarkable liver and spleen -Giving his mild degree of anemia, and asymptomatic, I do not feel he needs a bone marrow biopsy at this point. We'll observe his CBC, bone marrow biopsy may be necessary if his anemia gets worse. - his hemoglobin level has not changed much in the past few years, we'll repeat again in one year. -I suggest him to take multivitamin once daily, try to eat more fresh vegetable and meat.   2 atrial fibrillation -He will continue follow-up with his cardiologist.  Follow-up:  1 year with lab CBC and diff   All questions were answered. The patient knows to call the clinic with any problems, questions or concerns. I spent 20 minutes counseling the patient face to face. The total time spent in the appointment was 25 minutes and more than 50% was on counseling.     Truitt Merle, MD 05/02/2015 2:08 PM

## 2015-05-31 ENCOUNTER — Encounter: Payer: Self-pay | Admitting: Cardiology

## 2015-05-31 ENCOUNTER — Ambulatory Visit (INDEPENDENT_AMBULATORY_CARE_PROVIDER_SITE_OTHER): Payer: Medicare Other | Admitting: Cardiology

## 2015-05-31 VITALS — BP 130/92 | HR 41 | Ht 67.5 in | Wt 208.8 lb

## 2015-05-31 DIAGNOSIS — I481 Persistent atrial fibrillation: Secondary | ICD-10-CM | POA: Diagnosis not present

## 2015-05-31 DIAGNOSIS — R9430 Abnormal result of cardiovascular function study, unspecified: Secondary | ICD-10-CM

## 2015-05-31 DIAGNOSIS — R0989 Other specified symptoms and signs involving the circulatory and respiratory systems: Secondary | ICD-10-CM | POA: Diagnosis not present

## 2015-05-31 DIAGNOSIS — I4819 Other persistent atrial fibrillation: Secondary | ICD-10-CM

## 2015-05-31 NOTE — Patient Instructions (Signed)
Medication Instructions:  Same-no change  Labwork: None  Testing/Procedures: None  Follow-Up: Your physician wants you to follow-up in: 6 months with Dr [name]. You will receive a reminder letter in the mail two months in advance. If you don't receive a letter, please call our office to schedule the follow-up appointment.

## 2015-05-31 NOTE — Assessment & Plan Note (Signed)
The patient has had fall evaluation by hematology. At this point he has a nonspecific anemia. He is to be followed over time. No further workup at this time.

## 2015-05-31 NOTE — Progress Notes (Signed)
Cardiology Office Note   Date:  05/31/2015   ID:  Jared Whitaker, DOB [birth year], MRN [id number]  PCP:  Gennette Pac, MD  Cardiologist:  Dola Argyle, MD   Chief Complaint  Patient presents with  . Appointment    Follow-up atrial fibrillation      History of Present Illness: Jared Whitaker is a 69 y.o. male who presents today to follow up atrial fibrillation. Since his last visit with me, he has been seen in consultation by Dr. [name]. He has significant bradycardia. He is not on any rate lowering medications. He has not had syncope or presyncope. He is anticoagulated. He has follow-up arranged with Dr. [name] to consider options going forward.  The patient is aware that I'm retiring at the end of September, 2016. He tells me that he has seen Dr. [name] many years ago. He would like to follow-up with her. We will make arrangements for her to be his general cardiologist.    Past Medical History  Diagnosis Date  . Colon polyp     Tubular adenoma- in colonoscopy in 07/2006 followed by GI Dr [name]  . Contact dermatitis   . Hyperlipidemia   . Osteoarthritis     of the shoulders  . Anemia   . Normal cardiac stress test   . Persistent atrial fibrillation     chads2vasc score is 2  . Shortness of breath   . Anemia   . Snoring   . Overweight   . Hypertension     Past Surgical History  Procedure Laterality Date  . Colonoscopy    . Left leg fracture  1969  . Cardioversion N/A 03/03/2014    Procedure: CARDIOVERSION;  Surgeon: Carlena Bjornstad, MD;  Location: [facility] ENDOSCOPY;  Service: Cardiovascular;  Laterality: N/A;  . Cardioversion N/A 01/31/2015    Procedure: CARDIOVERSION;  Surgeon: Carlena Bjornstad, MD;  Location: [facility] ENDOSCOPY;  Service: Cardiovascular;  Laterality: N/A;    Patient Active Problem List   Diagnosis Date Noted  . Morbid obesity 04/14/2015  . Encounter for monitoring flecainide therapy 01/28/2015  . HX: anticoagulation 02/16/2014  . Persistent atrial  fibrillation 01/15/2014  . DOE (dyspnea on exertion) 01/15/2014  . Normal cardiac stress test   . Ejection fraction   . Colon polyp   . Hyperlipidemia   . Anemia       Current Outpatient Prescriptions  Medication Sig Dispense Refill  . XARELTO 20 MG TABS tablet TAKE 1 TABLET (20 MG TOTAL) BY MOUTH DAILY WITH SUPPER. 30 tablet 3   No current facility-administered medications for this visit.    Allergies:   Penicillins    Social History:  The patient  reports that he has never smoked. He does not have any smokeless tobacco history on file. He reports that he drinks alcohol. He reports that he does not use illicit drugs.   Family History:  The patient's family history includes Bladder Cancer in his father; CVA in his mother; Cancer in his paternal uncle; Heart attack in his father and mother.    ROS:  Please see the history of present illness.    Patient denies fever, chills, headache, sweats, rash, change in vision, change in hearing, chest pain, cough, nausea or vomiting, urinary symptoms. All other systems are reviewed and are negative.    PHYSICAL EXAM: VS:  BP 130/92 mmHg  Pulse 41  Ht 5' 7.5" (1.715 m)  Wt 208 lb 12.8 oz (94.711 kg)  BMI 32.20 kg/m2  SpO2 97% ,  Patient is stable. He is oriented to person, place. Affect is normal. Head is atraumatic. Sclera and conjunctiva are normal. There is no jugulovenous distention. Lungs are clear. Respiratory effort is nonlabored. Cardiac exam reveals S1 and S2. The abdomen is soft. There is no peripheral edema. There are no musculoskeletal deformities. There are no skin rashes.  EKG:   EKG is done today. He has atrial fibrillation with a slow rate.   Recent Labs: 04/04/2015: ALT 14; BUN 16.8; Creatinine 1.1; HGB 12.5*; Platelets 252; Potassium 4.6; Sodium 140; TSH 1.942    Lipid Panel No results found for: CHOL, TRIG, HDL, CHOLHDL, VLDL, LDLCALC, LDLDIRECT    Wt Readings from Last 3 Encounters:  05/31/15 208 lb 12.8 oz  (94.711 kg)  05/02/15 213 lb 11.2 oz (96.934 kg)  04/14/15 212 lb 3.2 oz (96.253 kg)      Current medicines are reviewed  . Understands his medications.     ASSESSMENT AND PLAN:

## 2015-05-31 NOTE — Assessment & Plan Note (Signed)
He continues to have atrial fibrillation. He is anticoagulated. His rate is slowed today. He is not having symptoms from this. The plan is for no change in therapy. He will follow-up with Dr. [name] and decide over time approaches to his atrial fibrillation.

## 2015-06-30 ENCOUNTER — Ambulatory Visit (HOSPITAL_BASED_OUTPATIENT_CLINIC_OR_DEPARTMENT_OTHER): Payer: Medicare Other | Attending: Internal Medicine

## 2015-06-30 DIAGNOSIS — I4891 Unspecified atrial fibrillation: Secondary | ICD-10-CM | POA: Insufficient documentation

## 2015-06-30 DIAGNOSIS — I480 Paroxysmal atrial fibrillation: Secondary | ICD-10-CM

## 2015-06-30 DIAGNOSIS — G4733 Obstructive sleep apnea (adult) (pediatric): Secondary | ICD-10-CM

## 2015-06-30 DIAGNOSIS — R0683 Snoring: Secondary | ICD-10-CM | POA: Insufficient documentation

## 2015-07-03 ENCOUNTER — Telehealth: Payer: Self-pay | Admitting: Cardiology

## 2015-07-03 DIAGNOSIS — G4733 Obstructive sleep apnea (adult) (pediatric): Secondary | ICD-10-CM

## 2015-07-03 NOTE — Telephone Encounter (Signed)
Please let patient know that they did not have significant sleep apnea but did not have adequate sleep time to fully assess. Please set up repeat sleep study with Lunesta 2mg  (#1 tablet with no refills) to take the night of the sleep study.

## 2015-07-03 NOTE — Progress Notes (Signed)
SLEEP STUDY   Patient Name: Jared Whitaker, Jared Whitaker Date: 06/30/2015 MRN: [id number] Gender: Male D.O.B: 22-Oct-1946 Age (years): 68 Referring Provider: Thompson Grayer Interpreting Physician: [name] MD, ABSM RPSGT: Baxter Flattery  Height (inches): 67 Weight (lbs): 205 BMI: 32 Neck Size: 18.00  CLINICAL INFORMATION  Sleep Study Type: NPSG  Indication for sleep study: Fatigue, OSA, Snoring, Witnessed Apneas  Epworth Sleepiness Score: 2  SLEEP STUDY TECHNIQUE As per the AASM Manual for the Scoring of Sleep and Associated Events v2.3 (April 2016) with a hypopnea requiring 4% desaturations.  The channels recorded and monitored were frontal, central and occipital EEG, electrooculogram (EOG), submentalis EMG (chin), nasal and oral airflow, thoracic and abdominal wall motion, anterior tibialis EMG, snore microphone, electrocardiogram, and pulse oximetry.  MEDICATIONS Patient's medications include: Xarelto. Medications self-administered by patient during sleep study : No sleep medicine administered.  SLEEP ARCHITECTURE The study was initiated at 10:58:19 PM and ended at 5:14:55 AM.  Sleep onset time was prolonged at 79.4 minutes and the sleep efficiency was reduced at 28.3%. The total sleep time was 106.5 minutes.  Stage REM latency was markedly prolonged at 285.5 minutes.  The patient spent 4.69% of the night in stage N1 sleep, 89.20% in stage N2 sleep, 0.00% in stage N3 and 6.10% in REM.  Alpha intrusion was absent.  Supine sleep was 30.99%.  RESPIRATORY PARAMETERS The overall apnea/hypopnea index (AHI) was 0.6 per hour. There were 0 total apneas, including 0 obstructive, 0 central and 0 mixed apneas. There were 1 hypopneas and 9 RERAs.  The AHI during Stage REM sleep was 0.0 per hour.  AHI while supine was 0.0 per hour.  The mean oxygen saturation was 94.64%. The minimum SpO2 during sleep was 92.00%.  Soft snoring was noted during this study.  CARDIAC DATA The  2 lead EKG demonstrated atrial fibrillation. The mean heart rate was 44.91 beats per minute. Other EKG findings include:None.  LEG MOVEMENT DATA The total PLMS were 13 with a resulting PLMS index of 7.32. Associated arousal with leg movement index was 0.0 .  IMPRESSIONS No significant obstructive sleep apnea occurred during this study (AHI = 0.6/h) but patient had less than 2 hours of sleep which is insufficient to assess for sleep apnea. No significant central sleep apnea occurred during this study (CAI = 0.0/h). Minimal desaturation was noted during this study (Min O2 = 92.00%). The patient snored with Soft snoring volume. EKG findings include Atrial Fibrillation. Mild periodic limb movements of sleep occurred during the study. No significant associated arousals.  DIAGNOSIS Atrial Fibrillation  RECOMMENDATIONS As the patient had insufficient sleep time to assess for sleep apnea, recommend repeat study using a sleep aide. Avoid alcohol, sedatives and other CNS depressants that may result in sleep apnea and disrupt normal sleep architecture. Sleep hygiene should be reviewed to assess factors that may improve sleep quality. Weight management and regular exercise should be initiated or continued if appropriate.  Signed: [name], MD ABSM Diplomate, American Board of Sleep Medicine 07/02/2015

## 2015-07-05 ENCOUNTER — Encounter: Payer: Self-pay | Admitting: *Deleted

## 2015-07-05 NOTE — Telephone Encounter (Signed)
Patient is aware of results. Okay to proceed with Sleep Aid/Repeat Study.  Will schedule and send a letter with new date.

## 2015-07-05 NOTE — Addendum Note (Signed)
Addended by: Andres Ege on: 07/05/2015 11:34 AM   Modules accepted: Orders

## 2015-07-29 ENCOUNTER — Ambulatory Visit (INDEPENDENT_AMBULATORY_CARE_PROVIDER_SITE_OTHER): Payer: Medicare Other | Admitting: Internal Medicine

## 2015-07-29 ENCOUNTER — Encounter: Payer: Self-pay | Admitting: Internal Medicine

## 2015-07-29 ENCOUNTER — Other Ambulatory Visit: Payer: Self-pay

## 2015-07-29 VITALS — BP 130/68 | HR 52 | Ht 67.5 in | Wt 211.6 lb

## 2015-07-29 DIAGNOSIS — I481 Persistent atrial fibrillation: Secondary | ICD-10-CM

## 2015-07-29 DIAGNOSIS — R0683 Snoring: Secondary | ICD-10-CM | POA: Insufficient documentation

## 2015-07-29 DIAGNOSIS — D649 Anemia, unspecified: Secondary | ICD-10-CM | POA: Diagnosis not present

## 2015-07-29 DIAGNOSIS — I4819 Other persistent atrial fibrillation: Secondary | ICD-10-CM

## 2015-07-29 NOTE — Progress Notes (Signed)
Electrophysiology Office Note   Date:  07/29/2015   ID:  Jared Whitaker, DOB August 19, 1946, MRN [id number]  PCP:  Gennette Pac, MD  Cardiologist:  Dr Ron Parker, pending with Dr. [name] Primary Electrophysiologist: Dr. [name]    Chief Complaint  Patient presents with  . PAF     History of Present Illness: Jared Whitaker is a 69 y.o. male who presents today for a f/u on his persistent atrial fibrillation.  He reports initially being diagnosed with atrial fibrillation over a year ago after presenting with symptoms of fatigue and decreased exercise tolerance,  he underwent cardioversion 03/03/14 with improvement in symptoms.  He did very well until December 2015 when he developed recurrent atrial fibrillation.  He was placed on flecainide and again underwent cardioversion 01/31/15, this he thinks lasted no more then a couple hours at best, but not particularly symptomatic at all.  He has been in afib since that time.  V rates have been controlled.  He is chronically anticoagulated with xarelto.  He denies any kind of CP, no real palpitations, in fact states that when he was last at his primary cardiologist he was surprised to hear he was in AF because he had not had any kind of symptoms and no palpitations.  He denies any dizziness, no near syncope or syncope.  He has chronic issues with anemia reports historically having GI evaluations, and more recently hematology evaluation without any specific etiology.  He denies any bleeding or signs of bleeding. He had a sleep study recently unfortunately did not have adequate sleep time to get a result, pending a repeat exam.  Today, he denies symptoms of palpitations, chest pain, shortness of breath, orthopnea, PND, lower extremity edema, claudication, dizziness, bleeding, or neurologic sequela. The patient is tolerating medications without difficulties and is otherwise without complaint today.    Past Medical History  Diagnosis Date  . Colon polyp    Tubular adenoma- in colonoscopy in 07/2006 followed by GI Dr [name]  . Contact dermatitis   . Hyperlipidemia   . Osteoarthritis     of the shoulders  . Anemia   . Normal cardiac stress test   . Persistent atrial fibrillation     chads2vasc score is 2  . Shortness of breath   . Anemia   . Snoring   . Overweight   . Hypertension    Past Surgical History  Procedure Laterality Date  . Colonoscopy    . Left leg fracture  1969  . Cardioversion N/A 03/03/2014    Procedure: CARDIOVERSION;  Surgeon: Carlena Bjornstad, MD;  Location: [facility] ENDOSCOPY;  Service: Cardiovascular;  Laterality: N/A;  . Cardioversion N/A 01/31/2015    Procedure: CARDIOVERSION;  Surgeon: Carlena Bjornstad, MD;  Location: [facility] ENDOSCOPY;  Service: Cardiovascular;  Laterality: N/A;     Current Outpatient Prescriptions  Medication Sig Dispense Refill  . XARELTO 20 MG TABS tablet TAKE 1 TABLET (20 MG TOTAL) BY MOUTH DAILY WITH SUPPER. 30 tablet 3   No current facility-administered medications for this visit.    Allergies:   Penicillins   Social History:  The patient  reports that he has never smoked. He does not have any smokeless tobacco history on file. He reports that he drinks alcohol. He reports that he does not use illicit drugs.   Family History:  The patient's  family history includes Bladder Cancer in his father; CVA in his mother; Cancer in his paternal uncle; Heart attack in his father and mother.  ROS:  Please see the history of present illness.   All other systems are reviewed and negative.    PHYSICAL EXAM: VS:  BP 130/68 mmHg  Pulse 52  Ht 5' 7.5" (1.715 m)  Wt 211 lb 9.6 oz (95.981 kg)  BMI 32.63 kg/m2 , BMI Body mass index is 32.63 kg/(m^2). GEN: Pleasant, obese,  in no acute distress HEENT: normal Neck: no JVD, carotid bruits, or masses Cardiac: irreg-irreg; no murmurs, rubs, or gallops,no edema, 3+ pedal pulses b/l Respiratory:  clear to auscultation bilaterally, normal work of breathing GI:  soft, nontender, nondistended, + BS MS: no deformity or atrophy Skin: warm and dry  Neuro:  Strength and sensation are intact Psych: euthymic mood, full affect  EKG:  EKG is ordered today. The ekg ordered today shows afib, V rates are somewhat regular at 52 bpm, RBBB   Recent Labs: 04/04/2015: ALT 14; BUN 16.8; Creatinine 1.1; HGB 12.5*; Platelets 252; Potassium 4.6; Sodium 140; TSH 1.942     Lipid Panel  No results found for: CHOL, TRIG, HDL, CHOLHDL, VLDL, LDLCALC, LDLDIRECT   Wt Readings from Last 3 Encounters:  07/29/15 211 lb 9.6 oz (95.981 kg)  05/31/15 208 lb 12.8 oz (94.711 kg)  05/02/15 213 lb 11.2 oz (96.934 kg)      Other studies Reviewed: 01/20/14: Echo with EF 55%, mild MR, LA 4.4 12/2000: Stress test no ischemia or fixed defect, EF 57%  ASSESSMENT AND PLAN:  1.  The patient has persistent atrial fibrillation.  He has only mild LA enlargement.  He has a chads2vasc score of 1-2 and is anticoagulated with xarelto.   Therapeutic strategies for afib including medicine and ablation were re-discussed in detail with the patient today. Risk, benefits, and alternatives to EP study and radiofrequency ablation for afib were also discussed in detail today.  In addition, options of long term rate control, tikosyn, and amiodarone were discussed.   He has a chronic RBBB and is slow/ regular on exam today, suspicious that he may have some degree of AV nodal disease.  Will need to be cautious with any AV nodal agents in the future.  At this time, he feels well and would like to hold off any new medicines or consideration for procedures until he repeats his sleep study and is planned to f/u with his primary cardiologist who will now be Dr. [name].  He would likely prefer rate control long term.  He sees Dr [name] in February and will see [place] PRN.  2. Anemia Unclear etiology per pt, evaluated by Hematology without etiology Continue xarelto at this time. He denies bleeding or signs of  bleeding As chads2vasc score is <3, would not be a candidate for left atrial appendage occlusion at this time.  3. Overweight Lifestyle modification encouraged He is pending repeat sleep study   Current medicines are reviewed at length with the patient today.   The patient does not have concerns regarding his medicines.  The following changes were made today:  none  Labs/ tests ordered today include:  No orders of the defined types were placed in this encounter.    Army Fossa MD 07/29/2015 3:18 PM     [facility] [address] [place] [address] [phone] (office) [phone] (fax)

## 2015-07-29 NOTE — Patient Instructions (Signed)
Medication Instructions:  Your physician recommends that you continue on your current medications as directed. Please refer to the Current Medication list given to you today.   Labwork: None ordered   Testing/Procedures: None ordered   Follow-Up: Your physician recommends that you schedule a follow-up appointment as needed    Any Other Special Instructions Will Be Listed Below (If Applicable).   

## 2015-09-03 ENCOUNTER — Other Ambulatory Visit: Payer: Self-pay | Admitting: Cardiology

## 2015-09-18 ENCOUNTER — Ambulatory Visit (HOSPITAL_BASED_OUTPATIENT_CLINIC_OR_DEPARTMENT_OTHER): Payer: Medicare Other | Attending: Cardiology

## 2015-09-18 VITALS — Ht 67.0 in | Wt 205.0 lb

## 2015-09-18 DIAGNOSIS — G4730 Sleep apnea, unspecified: Secondary | ICD-10-CM | POA: Insufficient documentation

## 2015-09-18 DIAGNOSIS — R5383 Other fatigue: Secondary | ICD-10-CM | POA: Insufficient documentation

## 2015-09-18 DIAGNOSIS — R0683 Snoring: Secondary | ICD-10-CM | POA: Diagnosis not present

## 2015-09-18 DIAGNOSIS — G4733 Obstructive sleep apnea (adult) (pediatric): Secondary | ICD-10-CM | POA: Diagnosis present

## 2015-09-18 DIAGNOSIS — I4891 Unspecified atrial fibrillation: Secondary | ICD-10-CM | POA: Insufficient documentation

## 2015-09-22 ENCOUNTER — Telehealth: Payer: Self-pay | Admitting: Cardiology

## 2015-09-22 NOTE — Telephone Encounter (Signed)
Please let patient know that sleep study showed no significant sleep apnea.  He did have severe snoring so could consider referral to ENT for evaluation.

## 2015-09-22 NOTE — Sleep Study (Addendum)
   Patient Name: Jared Whitaker, Furse MRN: [id number] Study Date: 09/18/2015 Gender: Male D.O.B: Jan 05, 1946 Age (years): 68 Referring Provider: Thompson Grayer Interpreting Physician: [name] MD, ABSM RPSGT: Baxter Flattery  Weight (lbs): 205 Height (inches): 67 BMI: 32 Neck Size: 18.00  CLINICAL INFORMATION Sleep Study Type: NPSG Indication for sleep study: Fatigue, OSA, Snoring, Witnessed Apneas Epworth Sleepiness Score: 2 Most recent polysomnogram dated 06/30/2015 revealed an AHI of 0.6/h and RDI of 5.6/h.  SLEEP STUDY TECHNIQUE As per the AASM Manual for the Scoring of Sleep and Associated Events v2.3 (April 2016) with a hypopnea requiring 4% desaturations. The channels recorded and monitored were frontal, central and occipital EEG, electrooculogram (EOG), submentalis EMG (chin), nasal and oral airflow, thoracic and abdominal wall motion, anterior tibialis EMG, snore microphone, electrocardiogram, and pulse oximetry.  MEDICATIONS Patient's medications include: Xarelto.  Medications self-administered by patient during sleep study : No sleep medicine administered.  SLEEP ARCHITECTURE The study was initiated at 11:10:24 PM and ended at 4:57:36 AM. Sleep onset time was 1.1 minutes and the sleep efficiency was 84.4%. The total sleep time was 293.1 minutes. Stage REM latency was prolonged 180.0 minutes. The patient spent 4.61% of the night in stage N1 sleep, 87.20% in stage N2 sleep, 2.39% in stage N3 and 5.80% in REM. Alpha intrusion was absent. Supine sleep was 16.44%.  RESPIRATORY PARAMETERS The overall apnea/hypopnea index (AHI) was 0.2 per hour. There were 0 total apneas, including 0 obstructive, 0 central and 0 mixed apneas. There were 1 hypopneas and 4 RERAs. The AHI during Stage REM sleep was 3.5 per hour. AHI while supine was 0.0 per hour. The mean oxygen saturation was 95.41%. The minimum SpO2 during sleep was 92.00%. Loud snoring was noted during this study.  CARDIAC  DATA The 2 lead EKG demonstrated atrial fibrillation. The mean heart rate was 45.73 beats per minute.  LEG MOVEMENT DATA The total PLMS were 247 with a resulting PLMS index of 50.57. Associated arousal with leg movement index was 0.0 .  IMPRESSIONS - No significant obstructive sleep apnea occurred during this study (AHI = 0.2/h). - No significant central sleep apnea occurred during this study (CAI = 0.0/h). - The patient had minimal or no oxygen desaturation during the study (Min O2 = 92.00%) - The patient snored with Loud snoring volume. - EKG findings include Atrial Fibrillation. - Severe periodic limb movements of sleep occurred during the study. No significant associated arousals.  DIAGNOSIS - Snoring - Atrial fibrillation  RECOMMENDATIONS - Avoid alcohol, sedatives and other CNS depressants that may result in sleep apnea and disrupt normal sleep architecture. - Sleep hygiene should be reviewed to assess factors that may improve sleep quality. - Weight management and regular exercise should be initiated or continued if appropriate.   [place], American Board of Sleep Medicine  ELECTRONICALLY SIGNED ON:  09/22/2015, 9:17 PM [place] PH: [phone]   FX: [phone] [place]

## 2015-09-23 NOTE — Telephone Encounter (Signed)
Left message for patient to call back for results.  

## 2015-09-23 NOTE — Telephone Encounter (Signed)
Patient is aware of results. Stated verbal understanding 

## 2015-09-23 NOTE — Telephone Encounter (Signed)
Please call patient.  He is returning call from [place].

## 2015-11-23 DIAGNOSIS — Z86010 Personal history of colonic polyps: Secondary | ICD-10-CM | POA: Diagnosis not present

## 2015-11-23 DIAGNOSIS — Z79899 Other long term (current) drug therapy: Secondary | ICD-10-CM | POA: Diagnosis not present

## 2015-11-23 DIAGNOSIS — I4891 Unspecified atrial fibrillation: Secondary | ICD-10-CM | POA: Diagnosis not present

## 2015-11-23 DIAGNOSIS — E782 Mixed hyperlipidemia: Secondary | ICD-10-CM | POA: Diagnosis not present

## 2015-11-24 ENCOUNTER — Encounter: Payer: Self-pay | Admitting: Cardiology

## 2015-11-24 ENCOUNTER — Ambulatory Visit (INDEPENDENT_AMBULATORY_CARE_PROVIDER_SITE_OTHER): Payer: Commercial Managed Care - HMO | Admitting: Cardiology

## 2015-11-24 VITALS — BP 138/76 | HR 64 | Ht 67.0 in | Wt 207.0 lb

## 2015-11-24 DIAGNOSIS — I481 Persistent atrial fibrillation: Secondary | ICD-10-CM | POA: Diagnosis not present

## 2015-11-24 DIAGNOSIS — Z86010 Personal history of colonic polyps: Secondary | ICD-10-CM | POA: Diagnosis not present

## 2015-11-24 DIAGNOSIS — I4891 Unspecified atrial fibrillation: Secondary | ICD-10-CM | POA: Diagnosis not present

## 2015-11-24 DIAGNOSIS — I4819 Other persistent atrial fibrillation: Secondary | ICD-10-CM

## 2015-11-24 DIAGNOSIS — Z79899 Other long term (current) drug therapy: Secondary | ICD-10-CM | POA: Diagnosis not present

## 2015-11-24 DIAGNOSIS — E782 Mixed hyperlipidemia: Secondary | ICD-10-CM | POA: Diagnosis not present

## 2015-11-24 NOTE — Patient Instructions (Signed)

## 2015-11-24 NOTE — Progress Notes (Signed)
Cardiology Office Note   Date:  11/24/2015   ID:  Jared Whitaker, DOB 1946-06-06, MRN [id number]  PCP:  Jared Pac, MD    Chief Complaint  Patient presents with  . Atrial Fibrillation      History of Present Illness: Jared Whitaker is a 70 y.o. male who presents today to follow up on his persistent atrial fibrillation. He is chronically anticoagulation with Xarelto. He was formerly followed by Dr. Ron Whitaker who has since retired.   He has some problems with fatigued and decreased exercise tolerance and underwent DCCV 02/2014 with improvement in symptoms.  He reoccurrence of afib in 10/2014 and was placed on flecainide and underwent DCCV again 01/2015 but only lasted a few hours and went back into afib.   He has a history of bradycardia followed by Dr. Rayann Whitaker.  In discussion with Dr. Rayann Whitaker he was not interested in RFA of afib or AAD.  He prefers rate control due to lack of symptoms.  He has not had syncope or presyncope. He has a chronic RBBB.  He recently had a sleep study that did not show any significant OSA.  He denies any chest pain, SOB (except for colds), LE edema, dizziness, palpitations or syncope.  He says that he does not have the energy he used to have.     Past Medical History  Diagnosis Date  . Colon polyp     Tubular adenoma- in colonoscopy in 07/2006 followed by GI Dr Jared Whitaker  . Contact dermatitis   . Hyperlipidemia     pateint denies this  . Osteoarthritis     of the shoulders  . Anemia   . Normal cardiac stress test   . Persistent atrial fibrillation (HCC)     chads2vasc score is 1-2 (pt denies HTN)  . Shortness of breath   . Anemia   . Snoring     w/u for apnea is in progress  . Overweight   . Hypertension     patient denies this    Past Surgical History  Procedure Laterality Date  . Colonoscopy    . Left leg fracture  1969  . Cardioversion N/A 03/03/2014    Procedure: CARDIOVERSION;  Surgeon: Jared Bjornstad, MD;  Location: [facility]  ENDOSCOPY;  Service: Cardiovascular;  Laterality: N/A;  . Cardioversion N/A 01/31/2015    Procedure: CARDIOVERSION;  Surgeon: Jared Bjornstad, MD;  Location: [facility] ENDOSCOPY;  Service: Cardiovascular;  Laterality: N/A;     Current Outpatient Prescriptions  Medication Sig Dispense Refill  . XARELTO 20 MG TABS tablet TAKE 1 TABLET (20 MG TOTAL) BY MOUTH DAILY WITH SUPPER. 30 tablet 3   No current facility-administered medications for this visit.    Allergies:   Penicillins    Social History:  The patient  reports that he has never smoked. He does not have any smokeless tobacco history on file. He reports that he drinks alcohol. He reports that he does not use illicit drugs.   Family History:  The patient's family history includes Bladder Cancer in his father; CVA in his mother; Cancer in his paternal uncle; Heart attack in his father and mother.    ROS:  Please see the history of present illness.   Otherwise, review of systems are positive for none.   All other systems are reviewed and negative.    PHYSICAL EXAM: VS:  BP 138/76 mmHg  Pulse 64  Ht  5\' 7"  (1.702 m)  Wt 207 lb (93.895 kg)  BMI 32.41 kg/m2 , BMI Body mass index is 32.41 kg/(m^2). GEN: Well nourished, well developed, in no acute distress HEENT: normal Neck: no JVD, carotid bruits, or masses Cardiac: irregularly irregular; no murmurs, rubs, or gallops,no edema  Respiratory:  clear to auscultation bilaterally, normal work of breathing GI: soft, nontender, nondistended, + BS MS: no deformity or atrophy Skin: warm and dry, no rash Neuro:  Strength and sensation are intact Psych: euthymic mood, full affect   EKG:  EKG is not ordered today.    Recent Labs: 04/04/2015: ALT 14; BUN 16.8; Creatinine 1.1; HGB 12.5*; Platelets 252; Potassium 4.6; Sodium 140; TSH 1.942    Lipid Panel No results found for: CHOL, TRIG, HDL, CHOLHDL, VLDL, LDLCALC, LDLDIRECT    Wt Readings from Last 3 Encounters:  11/24/15 207 lb (93.895  kg)  09/18/15 205 lb (92.987 kg)  07/29/15 211 lb 9.6 oz (95.981 kg)       ASSESSMENT AND PLAN:  1.  Chronic atrial fibrillation rate controlled.  He was seen by Dr. Rayann Whitaker regarding possible RFA of afib vs. Rate control vs. AAD therapy and opted for rate control.  He is anticoagulated with Xarelto for a CHADS2VASC score of 2.     2.  Asymptomatic bradycardia - resolved 3.  HTN - patient denies   Current medicines are reviewed at length with the patient today.  The patient does not have concerns regarding medicines.  The following changes have been made:  no change  Labs/ tests ordered today: See above Assessment and Plan No orders of the defined types were placed in this encounter.     Disposition:   FU with me in 1 year  Signed, Jared Margarita, MD  11/24/2015 2:14 PM    [facility] [place], [place], [place]  [address] Phone: [phone]; Fax: [phone]

## 2016-01-20 ENCOUNTER — Other Ambulatory Visit: Payer: Self-pay | Admitting: Cardiology

## 2016-01-26 DIAGNOSIS — Z86010 Personal history of colonic polyps: Secondary | ICD-10-CM | POA: Diagnosis not present

## 2016-01-26 DIAGNOSIS — N400 Enlarged prostate without lower urinary tract symptoms: Secondary | ICD-10-CM | POA: Diagnosis not present

## 2016-01-26 DIAGNOSIS — Z125 Encounter for screening for malignant neoplasm of prostate: Secondary | ICD-10-CM | POA: Diagnosis not present

## 2016-01-26 DIAGNOSIS — Z0000 Encounter for general adult medical examination without abnormal findings: Secondary | ICD-10-CM | POA: Diagnosis not present

## 2016-01-26 DIAGNOSIS — E782 Mixed hyperlipidemia: Secondary | ICD-10-CM | POA: Diagnosis not present

## 2016-01-26 DIAGNOSIS — J309 Allergic rhinitis, unspecified: Secondary | ICD-10-CM | POA: Diagnosis not present

## 2016-01-26 DIAGNOSIS — R05 Cough: Secondary | ICD-10-CM | POA: Diagnosis not present

## 2016-01-26 DIAGNOSIS — D649 Anemia, unspecified: Secondary | ICD-10-CM | POA: Diagnosis not present

## 2016-01-26 DIAGNOSIS — Z79899 Other long term (current) drug therapy: Secondary | ICD-10-CM | POA: Diagnosis not present

## 2016-01-26 DIAGNOSIS — I480 Paroxysmal atrial fibrillation: Secondary | ICD-10-CM | POA: Diagnosis not present

## 2016-02-02 DIAGNOSIS — X32XXXA Exposure to sunlight, initial encounter: Secondary | ICD-10-CM | POA: Diagnosis not present

## 2016-02-02 DIAGNOSIS — B9689 Other specified bacterial agents as the cause of diseases classified elsewhere: Secondary | ICD-10-CM | POA: Diagnosis not present

## 2016-02-02 DIAGNOSIS — L570 Actinic keratosis: Secondary | ICD-10-CM | POA: Diagnosis not present

## 2016-02-02 DIAGNOSIS — L02425 Furuncle of right lower limb: Secondary | ICD-10-CM | POA: Diagnosis not present

## 2016-03-13 DIAGNOSIS — J069 Acute upper respiratory infection, unspecified: Secondary | ICD-10-CM | POA: Diagnosis not present

## 2016-03-13 DIAGNOSIS — G479 Sleep disorder, unspecified: Secondary | ICD-10-CM | POA: Diagnosis not present

## 2016-03-26 DIAGNOSIS — H2513 Age-related nuclear cataract, bilateral: Secondary | ICD-10-CM | POA: Diagnosis not present

## 2016-04-18 DIAGNOSIS — H2511 Age-related nuclear cataract, right eye: Secondary | ICD-10-CM | POA: Diagnosis not present

## 2016-04-20 ENCOUNTER — Telehealth: Payer: Self-pay | Admitting: Hematology

## 2016-04-20 NOTE — Telephone Encounter (Signed)
S/w pt to advise of appt chg from 6/19 to 6/29 due to md pal. Pt says he cannot make appt on 6/26 he will be out of town. Gave appt for 7/7 @ 2.45. Pt agrees.  Attempted to schedule appt for 7/7 @ 3.15 but slot is now gone.   Called pt back to discuss a different day for appt. Gave appt for 7/10 @ 2.45pm. Pt agrees but has concerns about how much blood is going to be taken because he is having eye surgery around the same time. Pt agrees to appt but says he may cancel. He will speak to his eye surgeon.

## 2016-04-30 ENCOUNTER — Other Ambulatory Visit: Payer: Medicare Other

## 2016-04-30 ENCOUNTER — Ambulatory Visit: Payer: Medicare Other | Admitting: Hematology

## 2016-05-10 ENCOUNTER — Ambulatory Visit: Payer: Self-pay | Admitting: Hematology

## 2016-05-10 ENCOUNTER — Other Ambulatory Visit: Payer: Commercial Managed Care - HMO

## 2016-05-18 ENCOUNTER — Other Ambulatory Visit: Payer: Commercial Managed Care - HMO

## 2016-05-21 ENCOUNTER — Ambulatory Visit (HOSPITAL_BASED_OUTPATIENT_CLINIC_OR_DEPARTMENT_OTHER): Payer: Commercial Managed Care - HMO | Admitting: Hematology

## 2016-05-21 ENCOUNTER — Telehealth: Payer: Self-pay | Admitting: Hematology

## 2016-05-21 ENCOUNTER — Encounter: Payer: Self-pay | Admitting: Hematology

## 2016-05-21 ENCOUNTER — Other Ambulatory Visit (HOSPITAL_BASED_OUTPATIENT_CLINIC_OR_DEPARTMENT_OTHER): Payer: Commercial Managed Care - HMO

## 2016-05-21 VITALS — BP 140/64 | HR 56 | Temp 98.1°F | Resp 18 | Ht 67.0 in | Wt 211.3 lb

## 2016-05-21 DIAGNOSIS — D649 Anemia, unspecified: Secondary | ICD-10-CM

## 2016-05-21 DIAGNOSIS — Z7901 Long term (current) use of anticoagulants: Secondary | ICD-10-CM | POA: Diagnosis not present

## 2016-05-21 DIAGNOSIS — I4891 Unspecified atrial fibrillation: Secondary | ICD-10-CM | POA: Diagnosis not present

## 2016-05-21 LAB — CBC & DIFF AND RETIC
BASO%: 0.5 % (ref 0.0–2.0)
BASOS ABS: 0 10*3/uL (ref 0.0–0.1)
EOS%: 3.9 % (ref 0.0–7.0)
Eosinophils Absolute: 0.2 10*3/uL (ref 0.0–0.5)
HCT: 36.2 % — ABNORMAL LOW (ref 38.4–49.9)
HEMOGLOBIN: 12.2 g/dL — AB (ref 13.0–17.1)
Immature Retic Fract: 2.3 % — ABNORMAL LOW (ref 3.00–10.60)
LYMPH%: 47.1 % (ref 14.0–49.0)
MCH: 31.3 pg (ref 27.2–33.4)
MCHC: 33.7 g/dL (ref 32.0–36.0)
MCV: 92.8 fL (ref 79.3–98.0)
MONO#: 0.6 10*3/uL (ref 0.1–0.9)
MONO%: 9.7 % (ref 0.0–14.0)
NEUT#: 2.3 10*3/uL (ref 1.5–6.5)
NEUT%: 38.8 % — ABNORMAL LOW (ref 39.0–75.0)
NRBC: 0 % (ref 0–0)
Platelets: 235 10*3/uL (ref 140–400)
RBC: 3.9 10*6/uL — ABNORMAL LOW (ref 4.20–5.82)
RDW: 12.7 % (ref 11.0–14.6)
Retic %: 1.28 % (ref 0.80–1.80)
Retic Ct Abs: 49.92 10*3/uL (ref 34.80–93.90)
WBC: 6 10*3/uL (ref 4.0–10.3)
lymph#: 2.8 10*3/uL (ref 0.9–3.3)

## 2016-05-21 LAB — COMPREHENSIVE METABOLIC PANEL
ALBUMIN: 3.8 g/dL (ref 3.5–5.0)
ALK PHOS: 73 U/L (ref 40–150)
ALT: 14 U/L (ref 0–55)
AST: 20 U/L (ref 5–34)
Anion Gap: 7 mEq/L (ref 3–11)
BUN: 13.4 mg/dL (ref 7.0–26.0)
CALCIUM: 9.2 mg/dL (ref 8.4–10.4)
CO2: 25 mEq/L (ref 22–29)
Chloride: 109 mEq/L (ref 98–109)
Creatinine: 1.1 mg/dL (ref 0.7–1.3)
EGFR: 67 mL/min/{1.73_m2} — AB (ref >90)
Glucose: 95 mg/dl (ref 70–140)
POTASSIUM: 4.4 meq/L (ref 3.5–5.1)
SODIUM: 140 meq/L (ref 136–145)
Total Bilirubin: 0.67 mg/dL (ref 0.20–1.20)
Total Protein: 7 g/dL (ref 6.4–8.3)

## 2016-05-21 NOTE — Telephone Encounter (Signed)
per pfo to sch pt appt-no appts needed at this time-no avs printed per pt req

## 2016-05-21 NOTE — Progress Notes (Signed)
[facility]  [phone] [phone]  Clinic Follow up Note   Patient Care Team: [name], MD as PCP - General (Family Medicine) Wonda Horner, MD as Consulting Physician (Gastroenterology) 05/21/2016  CHIEF COMPLAINTS:  Follow up Anemia  HISTORY OF PRESENTING ILLNESS:    Jared Whitaker 70 y.o. male with PMH of AF, colon polyps, is here because of anemia.   He was found to have abnormal CBC from 1.5 years ago, CBC showed hemoglobin 12.8, hematocrit 30.8, normal MCV. He was referred to gastroenterologist Dr. [name], stool OB was negative, iron study was normal. His repeat a CBC in March 2016 showed hemoglobin 12.6, hematocrit 38.7. He was again sent to see Dr. [name], and repeated CBC on 02/16/2014 showed Hb 12.1, hematocrit 35.6, MCV 95. Serum iron 90, TIBC 308, transferrin 454, folic acid 09.8, vitamin B-12 472. Those are all within normal limits.   He denies recent chest pain on exertion, he has mild intermittent shortness of breath on heavy exertion, no pre-syncopal episodes, or palpitations. He had not noticed any recent bleeding such as epistaxis, hematuria or hematochezia The patient denies over the counter NSAID ingestion. He is not on antiplatelets agents. His last colonoscopy was 2013.  He had no prior history or diagnosis of cancer. His age appropriate screening programs are up-to-date. He denies any pica and eats a variety of diet. He never donated blood or received blood transfusion The patient has not taken any iron supplement. He lives alone, does not spent much time cooking, eats most processed meat, and frozen vegetables. He does not take vitamins.  CURRENT THERAPY: Observation  INTERIM HISTOPRY He returns for follow up. He is clinically doing very well, mild fatigue, but able to function very well at home. He is  retired, is not very physically active, sits in front of computer most of time during day, but does take care of his  house and yard, and tolerates routine activities without difficulty. He has quit alcohol, and drinks non-alcohol beer.   MEDICAL HISTORY:  Past Medical History  Diagnosis Date  . Colon polyp     Tubular adenoma- in colonoscopy in 07/2006 followed by GI Dr [name]  . Contact dermatitis   . Hyperlipidemia     pateint denies this  . Osteoarthritis     of the shoulders  . Anemia   . Normal cardiac stress test   . Persistent atrial fibrillation (HCC)     chads2vasc score is 1-2 (pt denies HTN)  . Shortness of breath   . Anemia   . Snoring     w/u for apnea is in progress  . Overweight   . Hypertension     patient denies this    SURGICAL HISTORY: Past Surgical History  Procedure Laterality Date  . Colonoscopy    . Left leg fracture  1969  . Cardioversion N/A 03/03/2014    Procedure: CARDIOVERSION;  Surgeon: Carlena Bjornstad, MD;  Location: [facility] ENDOSCOPY;  Service: Cardiovascular;  Laterality: N/A;  . Cardioversion N/A 01/31/2015    Procedure: CARDIOVERSION;  Surgeon: Carlena Bjornstad, MD;  Location: [facility] ENDOSCOPY;  Service: Cardiovascular;  Laterality: N/A;    SOCIAL HISTORY: Social History   Social History  . Marital Status: Single    Spouse Name: N/A  . Number of Children: N/A  . Years of Education: N/A   Occupational History  . Not on file.   Social History Main Topics  . Smoking status: Never Smoker   .  Smokeless tobacco: Not on file  . Alcohol Use: 0.0 oz/week     Comment: 3 six packs a week  . Drug Use: No  . Sexual Activity: Not on file   Other Topics Concern  . Not on file   Social History Narrative   Lives in [place].  Works an a Nurse, adult.    FAMILY HISTORY: Family History  Problem Relation Age of Onset  . CVA Mother   . Heart attack Mother   . Heart attack Father     smoker  . Bladder Cancer Father   . Cancer Paternal Uncle     ALLERGIES:  is allergic to penicillins.  MEDICATIONS:  Current Outpatient Prescriptions  Medication  Sig Dispense Refill  . Melatonin 2.5 MG CAPS Take 1 capsule by mouth at bedtime as needed.    [name] ofloxacin (OCUFLOX) 0.3 % ophthalmic solution PUT 1 DROP IN OPERATIVE EYE TWICE A DAY BEGIN 2 DAYS BEFORE SURGERY AND CONTINUE TIL 2 WEEKS AFTER S  1  . PROLENSA 0.07 % SOLN PUT 1 DROP IN OPERATIVE EYE EVERY DAY START 2 DAYS BEFORE SURGERY AND CONTINUE TIL 2 WKS AFTER SURGE  1  . XARELTO 20 MG TABS tablet TAKE 1 TABLET (20 MG TOTAL) BY MOUTH DAILY WITH SUPPER. 30 tablet 9   No current facility-administered medications for this visit.    REVIEW OF SYSTEMS:   Constitutional: Denies fevers, chills or abnormal night sweats Eyes: Denies blurriness of vision, double vision or watery eyes Ears, nose, mouth, throat, and face: Denies mucositis or sore throat Respiratory: Denies cough, dyspnea or wheezes Cardiovascular: Denies palpitation, chest discomfort or lower extremity swelling Gastrointestinal:  Denies nausea, heartburn or change in bowel habits Skin: Denies abnormal skin rashes Lymphatics: Denies new lymphadenopathy or easy bruising Neurological:Denies numbness, tingling or new weaknesses Behavioral/Psych: Mood is stable, no new changes  All other systems were reviewed with the patient and are negative.  PHYSICAL EXAMINATION: ECOG PERFORMANCE STATUS: 0 - Asymptomatic  Filed Vitals:   05/21/16 1531  BP: 140/64  Pulse: 56  Temp: 98.1 F (36.7 C)  Resp: 18   Filed Weights   05/21/16 1531  Weight: 211 lb 4.8 oz (95.845 kg)    GENERAL:alert, no distress and comfortable SKIN: skin color, texture, turgor are normal, no rashes or significant lesions EYES: normal, conjunctiva are pink and non-injected, sclera clear OROPHARYNX:no exudate, no erythema and lips, buccal mucosa, and tongue normal  NECK: supple, thyroid normal size, non-tender, without nodularity LYMPH:  no palpable lymphadenopathy in the cervical, axillary or inguinal LUNGS: clear to auscultation and percussion with normal  breathing effort HEART: regular rate & rhythm and no murmurs and no lower extremity edema ABDOMEN:abdomen soft, non-tender and normal bowel sounds Musculoskeletal:no cyanosis of digits and no clubbing  PSYCH: alert & oriented x 3 with fluent speech NEURO: no focal motor/sensory deficits  LABORATORY DATA:  I have reviewed the data as listed CBC Latest Ref Rng 05/21/2016 04/04/2015 02/16/2014  WBC 4.0 - 10.3 10e3/uL 6.0 5.5 5.8  Hemoglobin 13.0 - 17.1 g/dL 12.2(L) 12.5(L) 12.1(L)  Hematocrit 38.4 - 49.9 % 36.2(L) 37.0(L) 35.6(L)  Platelets 140 - 400 10e3/uL 235 252 228.0    CMP Latest Ref Rng 05/21/2016 04/04/2015 01/28/2015  Glucose 70 - 140 mg/dl 95 96 100(H)  BUN 7.0 - 26.0 mg/dL 13.4 16.8 17  Creatinine 0.7 - 1.3 mg/dL 1.1 1.1 1.13  Sodium 136 - 145 mEq/L 140 140 136  Potassium 3.5 - 5.1 mEq/L 4.4 4.6  4.0  Chloride 96 - 112 mEq/L - - 103  CO2 22 - 29 mEq/L '25 23 30  ' Calcium 8.4 - 10.4 mg/dL 9.2 8.9 9.4  Total Protein 6.4 - 8.3 g/dL 7.0 7.0 -  Total Bilirubin 0.20 - 1.20 mg/dL 0.67 0.47 -  Alkaline Phos 40 - 150 U/L 73 77 -  AST 5 - 34 U/L 20 17 -  ALT 0 - 55 U/L 14 14 -     RADIOGRAPHIC STUDIES: I have personally reviewed the radiological images as listed and agreed with the findings in the report.  CT abdomen and pelvis with contrast on 09/18/2013 IMPRESSION: No acute abdominal/ pelvic findings, mass lesions or lymphadenopathy.  Mild diffuse fatty infiltration of the liver.  Scattered atherosclerotic calcifications involving the aorta and branch vessels.  ECHO 01/20/2014  Study Conclusions  - Left ventricle: Systolic function was normal. The estimated ejection fraction was in the range of 55% to 60%. - Mitral valve: Mild regurgitation. - Left atrium: The atrium was mildly dilated. - Atrial septum: No defect or patent foramen ovale was identified.  ASSESSMENT & PLAN:  70 year old Caucasian male, with past medical history of atrial fibrillation, on Xarelto  and flecainide, presents with mild normocytic anemia for a few years.  1. Mild normocytic anemia -His hemoglobin has been in the range of 12-12.8g/d in the past few years, with normal MCV and absolute reticular count. -Iron study and folic acid, M40 were normal. -I discussed the possible etiology of his anemia, such as anemia of nutrition deficiency, anemia of chronic disease, hypothyroidism, hypogonadism, bone marrow disease, such as MDS, multiple myeloma, lymphoma etc. -His peripheral smear  Morphology was unremarkable,  normalLDH, SPEP and UPEP with immunofixation were negative, TSH, testosterone, erythropoietin level, and  heavy metal levels were normal, -his anemia could be related to normal aging process, or low-grade MDS, medication flecainide related anemia is also possible, although rare - his previous CT and ultrasoundof the abdomen  Showed unremarkable liver and spleen -Giving his mild degree of anemia, and asymptomatic, I do not feel he needs a bone marrow biopsy at this point. We'll observe his CBC, bone marrow biopsy may be necessary if his anemia gets worse. - his hemoglobin level has not changed much in the past few years, 12.2 today -I encouraged him to take a multivitamin, have healthy diet and exercise regularly.  -No further work up at this point, repeat CBC 1-2 times a year   2 atrial fibrillation -He will continue follow-up with his cardiologist.  Follow-up:  He will follow up with his PCP with repeated CBC 1-2 times a year. I'll see him on an as needed.  All questions were answered. The patient knows to call the clinic with any problems, questions or concerns. I spent 10 minutes counseling the patient face to face. The total time spent in the appointment was 15 minutes and more than 50% was on counseling.     Truitt Merle, MD 05/21/2016

## 2016-05-23 DIAGNOSIS — H2512 Age-related nuclear cataract, left eye: Secondary | ICD-10-CM | POA: Diagnosis not present

## 2016-05-23 DIAGNOSIS — H2511 Age-related nuclear cataract, right eye: Secondary | ICD-10-CM | POA: Diagnosis not present

## 2016-05-30 DIAGNOSIS — H2512 Age-related nuclear cataract, left eye: Secondary | ICD-10-CM | POA: Diagnosis not present

## 2016-06-19 DIAGNOSIS — H04122 Dry eye syndrome of left lacrimal gland: Secondary | ICD-10-CM | POA: Diagnosis not present

## 2016-07-13 ENCOUNTER — Telehealth: Payer: Self-pay | Admitting: Cardiology

## 2016-07-13 NOTE — Telephone Encounter (Signed)
New message       Talk to the nurse----pt states he has had a change in his AFIB

## 2016-07-13 NOTE — Telephone Encounter (Signed)
Patient states Dr. [name] instructed him to call if his afib became "unmanageble." 2 weeks ago, he converted to afib and stayed out of rhythm for 1.5 days before he returned to NSR. During this time, he reports he "barely moved at all because it was hard to breath." He could hardly sleep. Then, the afib came back 2 days ago and has not broken. He states he does not feel nearly as bad as a couple weeks ago, and he feels like it's "getting better" as the day goes on. He is able to move and sleep. Confirmed with patient he is in no acute distress. Confirmed with patient he is taking Xarelto as directed and has not missed any doses. The patient understands to go to the ED if SOB worsens or if he feels like his HR is fast (he states it does not feel like it is racing at all). Scheduled patient for follow-up with Dr. [name] next Friday.  He understands he will be called with further instructions from Dr. [name] if she has any prior to OV. He was grateful for call.

## 2016-07-14 NOTE — Telephone Encounter (Signed)
Please have patient come in to see me Tuesday

## 2016-07-16 DIAGNOSIS — R6889 Other general symptoms and signs: Secondary | ICD-10-CM | POA: Diagnosis not present

## 2016-07-17 ENCOUNTER — Ambulatory Visit (INDEPENDENT_AMBULATORY_CARE_PROVIDER_SITE_OTHER): Payer: Commercial Managed Care - HMO | Admitting: Cardiology

## 2016-07-17 ENCOUNTER — Ambulatory Visit
Admission: RE | Admit: 2016-07-17 | Discharge: 2016-07-17 | Disposition: A | Discharge status: Home / Self Care | Payer: Commercial Managed Care - HMO | Source: Ambulatory Visit | Attending: Cardiology | Admitting: Cardiology

## 2016-07-17 ENCOUNTER — Telehealth: Payer: Self-pay

## 2016-07-17 ENCOUNTER — Encounter: Payer: Self-pay | Admitting: Cardiology

## 2016-07-17 ENCOUNTER — Encounter (INDEPENDENT_AMBULATORY_CARE_PROVIDER_SITE_OTHER): Payer: Self-pay

## 2016-07-17 ENCOUNTER — Other Ambulatory Visit (HOSPITAL_COMMUNITY)
Admit: 2016-07-17 | Discharge: 2016-07-17 | Disposition: A | Discharge status: Home / Self Care | Payer: Commercial Managed Care - HMO | Attending: Cardiology | Admitting: Cardiology

## 2016-07-17 VITALS — BP 142/86 | HR 77 | Ht 67.0 in | Wt 209.8 lb

## 2016-07-17 DIAGNOSIS — R0602 Shortness of breath: Secondary | ICD-10-CM

## 2016-07-17 DIAGNOSIS — I482 Chronic atrial fibrillation, unspecified: Secondary | ICD-10-CM

## 2016-07-17 DIAGNOSIS — R030 Elevated blood-pressure reading, without diagnosis of hypertension: Secondary | ICD-10-CM

## 2016-07-17 DIAGNOSIS — R0600 Dyspnea, unspecified: Secondary | ICD-10-CM

## 2016-07-17 DIAGNOSIS — R0609 Other forms of dyspnea: Secondary | ICD-10-CM | POA: Insufficient documentation

## 2016-07-17 LAB — CBC WITH DIFFERENTIAL/PLATELET
BASOS ABS: 74 {cells}/uL (ref 0–200)
BASOS PCT: 1 %
EOS PCT: 7 %
Eosinophils Absolute: 518 cells/uL — ABNORMAL HIGH (ref 15–500)
HCT: 41.9 % (ref 38.5–50.0)
HEMOGLOBIN: 14.2 g/dL (ref 13.2–17.1)
LYMPHS ABS: 2368 {cells}/uL (ref 850–3900)
Lymphocytes Relative: 32 %
MCH: 31.8 pg (ref 27.0–33.0)
MCHC: 33.9 g/dL (ref 32.0–36.0)
MCV: 93.7 fL (ref 80.0–100.0)
MONOS PCT: 11 %
MPV: 9.4 fL (ref 7.5–12.5)
Monocytes Absolute: 814 cells/uL (ref 200–950)
NEUTROS ABS: 3626 {cells}/uL (ref 1500–7800)
Neutrophils Relative %: 49 %
PLATELETS: 308 10*3/uL (ref 140–400)
RBC: 4.47 MIL/uL (ref 4.20–5.80)
RDW: 13.2 % (ref 11.0–15.0)
WBC: 7.4 10*3/uL (ref 3.8–10.8)

## 2016-07-17 LAB — D-DIMER, QUANTITATIVE (NOT AT ARMC)

## 2016-07-17 NOTE — Telephone Encounter (Signed)
-----   Message from Sueanne Margarita, MD sent at 07/17/2016  6:04 PM EDT ----- Chest xray is normal. Please set up for nuclear stress test and 2D echo

## 2016-07-17 NOTE — Telephone Encounter (Signed)
Informed patient of results and verbal understanding expressed.  Nuclear stress test and ECHO ordered for scheduling. Patient understands to fast for two hours prior to the test (except water), avoid caffeine/decaffeinated products, chocolate and nicotine for 12 hours prior to the test, and to come dressed prepared to exercise with tennis shoes. He was grateful for call.

## 2016-07-17 NOTE — Progress Notes (Signed)
Cardiology Office Note    Date:  07/17/2016   ID:  Jared Whitaker, DOB 1946-02-09, MRN [id number]  PCP:  Gennette Pac, MD  Cardiologist:  [name], MD   Chief Complaint  Patient presents with  . Atrial Fibrillation  . Shortness of Breath    History of Present Illness:  Jared Whitaker is a 70 y.o. male  who presents today to follow up.  He has chronic atrial fibrillation. He is chronically anticoagulation with Xarelto. He was formerly followed by Dr. Ron Parker who has since retired.   He has some problems with fatigued and decreased exercise tolerance and underwent DCCV 02/2014 with improvement in symptoms.  He reoccurrence of afib in 10/2014 and was placed on flecainide and underwent DCCV again 01/2015 but only lasted a few hours and went back into afib.   He has a history of bradycardia followed by Dr. [name].  In discussion with Dr. [name] he was not interested in RFA of afib or AAD.  He prefers rate control due to lack of symptoms.  He has not had syncope or presyncope. He has a chronic RBBB.  He recently had a sleep study that did not show any significant OSA.  He denies any chest pain, SOB (except for colds), LE edema, dizziness, palpitations or syncope.  He called the office last week with complaints of afib 2 weeks ago and was out of rhythm for 1.5 days.  He had been moving things in his house and later that night he started getting SOB.  He sat down and no matter what he did he was very SOB and had to sit in his lounge chair and sleep all night.  After about 3 days he felt better but went to the bathroom and had the sensation again.  Over the weekend he felt good on Sunday until the evening and then started having more SOB again and could not get comfortable and called 911 because he could not get comfortable with his breathing.  His O2 sats were 93% and EKG showed chronic atrial fibrillation and he did not go to the ER.  While the EMS was there he felt better and EMS thought if might be  anxiety.  Now he says that he thinks he has a chest cold and feels a rattling in his lungs and has to cough and get up.  He denies any fever but had an episode of diaphoresis.  He denies any chest pain or pressure.  He denies any long car trips or plane rides in the past month. He went to the beach in July.   Past Medical History:  Diagnosis Date  . Anemia   . Anemia   . Colon polyp    Tubular adenoma- in colonoscopy in 07/2006 followed by GI Dr [name]  . Contact dermatitis   . Hyperlipidemia    pateint denies this  . Hypertension    patient denies this  . Normal cardiac stress test   . Osteoarthritis    of the shoulders  . Overweight   . Persistent atrial fibrillation (HCC)    chads2vasc score is 1-2 (pt denies HTN)  . Shortness of breath   . Snoring    w/u for apnea is in progress    Past Surgical History:  Procedure Laterality Date  . CARDIOVERSION N/A 03/03/2014   Procedure: CARDIOVERSION;  Surgeon: Carlena Bjornstad, MD;  Location: [facility] ENDOSCOPY;  Service: Cardiovascular;  Laterality: N/A;  . CARDIOVERSION N/A 01/31/2015   Procedure: CARDIOVERSION;  Surgeon: Carlena Bjornstad, MD;  Location: [place];  Service: Cardiovascular;  Laterality: N/A;  . COLONOSCOPY    . left leg fracture  1969    Current Medications: Outpatient Medications Prior to Visit  Medication Sig Dispense Refill  . Melatonin 2.5 MG CAPS Take 1 capsule by mouth at bedtime as needed.    Alveda Reasons 20 MG TABS tablet TAKE 1 TABLET (20 MG TOTAL) BY MOUTH DAILY WITH SUPPER. 30 tablet 9  . ofloxacin (OCUFLOX) 0.3 % ophthalmic solution PUT 1 DROP IN OPERATIVE EYE TWICE A DAY BEGIN 2 DAYS BEFORE SURGERY AND CONTINUE TIL 2 WEEKS AFTER S  1  . PROLENSA 0.07 % SOLN PUT 1 DROP IN OPERATIVE EYE EVERY DAY START 2 DAYS BEFORE SURGERY AND CONTINUE TIL 2 WKS AFTER SURGE  1   No facility-administered medications prior to visit.      Allergies:   Penicillins   Social History   Social History  . Marital status: Single     Spouse name: N/A  . Number of children: N/A  . Years of education: N/A   Social History Main Topics  . Smoking status: Never Smoker  . Smokeless tobacco: Never Used  . Alcohol use 0.0 oz/week     Comment: 3 six packs a week  . Drug use: No  . Sexual activity: Not Asked   Other Topics Concern  . 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     Family History:  The patient's family history includes Bladder Cancer in his father; CVA in his mother; Cancer in his paternal uncle; Heart attack in his father and mother.   ROS:   Please see the history of present illness.    ROS All other systems reviewed and are negative.   PHYSICAL EXAM:   VS:  BP (!) 142/86   Pulse 77   Ht 5\' 7"  (1.702 m)   Wt 209 lb 12.8 oz (95.2 kg)   SpO2 97%   BMI 32.86 kg/m    GEN: Well nourished, well developed, in no acute distress  HEENT: normal  Neck: no JVD, carotid bruits, or masses Cardiac: RRR; no murmurs, rubs, or gallops,no edema.  Intact distal pulses bilaterally.  Respiratory:  clear to auscultation bilaterally, normal work of breathing GI: soft, nontender, nondistended, + BS MS: no deformity or atrophy  Skin: warm and dry, no rash Neuro:  Alert and Oriented x 3, Strength and sensation are intact Psych: euthymic mood, full affect  Wt Readings from Last 3 Encounters:  07/17/16 209 lb 12.8 oz (95.2 kg)  05/21/16 211 lb 4.8 oz (95.8 kg)  11/24/15 207 lb (93.9 kg)      Studies/Labs Reviewed:   EKG:  EKG is  ordered today.  The ekg ordered today demonstrates atrial fibrillation at 67bpm with no ST changes  Recent Labs: 05/21/2016: ALT 14; BUN 13.4; Creatinine 1.1; HGB 12.2; Platelets 235; Potassium 4.4; Sodium 140   Lipid Panel No results found for: CHOL, TRIG, HDL, CHOLHDL, VLDL, LDLCALC, LDLDIRECT  Additional studies/ records that were reviewed today include:  none    ASSESSMENT:    1. Chronic atrial fibrillation ([place])   2.  DOE (dyspnea on exertion)   3. Borderline systolic HTN      PLAN:  In order of problems listed above:  1. Chronic atrial fibrillation rate controlled.  CHADS2VASC score is 1-2 (denies HTN although it is on his list).  Continue  Xarelto.   2. DOE ? Etiology. He has chronic atrial fibrillation that is rate controlled so I doubt this is the etiology.  He has not had any long distance travel in the last month but did drive to the beach in July.  I will get a d-dimer to rule out PE.  He does have some crackles on exam at the left base and has had a cough productive of yellow phlegm.  I will check a chest xray to rule out PNA.  If this is normal then will get an echo to asses LVF (some sx sound like PND and orthopnea) and nuclear stress test to rule out ischemia.  I will check a TSH, BMET, CBC and BNP. 3. HTN - pt denies this - BP is borderline controlled.    Medication Adjustments/Labs and Tests Ordered: Current medicines are reviewed at length with the patient today.  Concerns regarding medicines are outlined above.  Medication changes, Labs and Tests ordered today are listed in the Patient Instructions below.  There are no Patient Instructions on file for this visit.   Signed, [name], MD  07/17/2016 2:55 PM    [address] [place], [place], [place]  [address] Phone: [phone]; Fax: [phone]

## 2016-07-17 NOTE — Patient Instructions (Addendum)
Medication Instructions:  Your physician recommends that you continue on your current medications as directed. Please refer to the Current Medication list given to you today.   Labwork: Today: BMET, CBC, TSH, BNP, and DDimer  Testing/Procedures: NONE.  Follow-Up: Your physician wants you to follow-up in: 6 months with Dr. [name]. You will receive a reminder letter in the mail two months in advance. If you don't receive a letter, please call our office to schedule the follow-up appointment.   Any Other Special Instructions Will Be Listed Below (If Applicable).     If you need a refill on your cardiac medications before your next appointment, please call your pharmacy.

## 2016-07-17 NOTE — Telephone Encounter (Signed)
Scheduled patient today at 1430. Patient agrees with treatment plan.

## 2016-07-18 ENCOUNTER — Telehealth: Payer: Self-pay | Admitting: Cardiology

## 2016-07-18 LAB — BASIC METABOLIC PANEL
BUN: 17 mg/dL (ref 7–25)
CALCIUM: 9.4 mg/dL (ref 8.6–10.3)
CO2: 21 mmol/L (ref 20–31)
CREATININE: 1.11 mg/dL (ref 0.70–1.18)
Chloride: 104 mmol/L (ref 98–110)
Glucose, Bld: 100 mg/dL — ABNORMAL HIGH (ref 65–99)
Potassium: 4.2 mmol/L (ref 3.5–5.3)
SODIUM: 139 mmol/L (ref 135–146)

## 2016-07-18 LAB — TSH: TSH: 1.49 m[IU]/L (ref 0.40–4.50)

## 2016-07-18 LAB — BRAIN NATRIURETIC PEPTIDE: BRAIN NATRIURETIC PEPTIDE: 32.6 pg/mL (ref <100)

## 2016-07-18 NOTE — Telephone Encounter (Signed)
Informed patient of results and verbal understanding expressed.  

## 2016-07-18 NOTE — Telephone Encounter (Signed)
-----   Message from Sueanne Margarita, MD sent at 07/18/2016 10:00 AM EDT ----- Please let patient know that labs were normal.  Continue current medical therapy.

## 2016-07-18 NOTE — Telephone Encounter (Signed)
New message  ° ° °Pt verbalized that he is returning call for rn for lab results °

## 2016-07-18 NOTE — Addendum Note (Signed)
Addended by: Harland German A on: 07/18/2016 03:48 PM   Modules accepted: Orders

## 2016-07-20 ENCOUNTER — Ambulatory Visit: Payer: Commercial Managed Care - HMO | Admitting: Cardiology

## 2016-07-20 ENCOUNTER — Ambulatory Visit (HOSPITAL_COMMUNITY): Payer: Commercial Managed Care - HMO | Attending: Cardiovascular Disease

## 2016-07-20 DIAGNOSIS — I4891 Unspecified atrial fibrillation: Secondary | ICD-10-CM | POA: Diagnosis not present

## 2016-07-20 DIAGNOSIS — R0609 Other forms of dyspnea: Secondary | ICD-10-CM | POA: Insufficient documentation

## 2016-07-20 DIAGNOSIS — R0602 Shortness of breath: Secondary | ICD-10-CM | POA: Diagnosis not present

## 2016-07-20 LAB — MYOCARDIAL PERFUSION IMAGING
CHL CUP RESTING HR STRESS: 55 {beats}/min
CSEPPHR: 96 {beats}/min
LVDIAVOL: 137 mL (ref 62–150)
LVSYSVOL: 59 mL
RATE: 0.29
SDS: 1
SRS: 2
SSS: 3
TID: 0.94

## 2016-07-20 MED ORDER — TECHNETIUM TC 99M TETROFOSMIN IV KIT
32.6000 | PACK | Freq: Once | INTRAVENOUS | Status: AC | PRN
  Administered 2016-07-20: 33 via INTRAVENOUS
  Filled 2016-07-20: qty 33

## 2016-07-20 MED ORDER — REGADENOSON 0.4 MG/5ML IV SOLN
0.4000 mg | Freq: Once | INTRAVENOUS | Status: AC
  Administered 2016-07-20: 0.4 mg via INTRAVENOUS

## 2016-07-20 MED ORDER — TECHNETIUM TC 99M TETROFOSMIN IV KIT
10.1000 | PACK | Freq: Once | INTRAVENOUS | Status: AC | PRN
  Administered 2016-07-20: 10.1 via INTRAVENOUS
  Filled 2016-07-20: qty 10

## 2016-07-23 ENCOUNTER — Telehealth: Payer: Self-pay

## 2016-07-23 DIAGNOSIS — R0602 Shortness of breath: Secondary | ICD-10-CM

## 2016-07-23 NOTE — Telephone Encounter (Signed)
-----   Message from Sueanne Margarita, MD sent at 07/23/2016  1:20 PM EDT ----- Please get PFTs

## 2016-07-23 NOTE — Telephone Encounter (Signed)
Patient agrees to PFTs. Test ordered for scheduling.

## 2016-07-31 ENCOUNTER — Encounter: Payer: Self-pay | Admitting: Cardiology

## 2016-07-31 ENCOUNTER — Ambulatory Visit (HOSPITAL_BASED_OUTPATIENT_CLINIC_OR_DEPARTMENT_OTHER): Payer: Commercial Managed Care - HMO

## 2016-07-31 ENCOUNTER — Other Ambulatory Visit: Payer: Self-pay

## 2016-07-31 ENCOUNTER — Ambulatory Visit (HOSPITAL_COMMUNITY)
Admission: RE | Admit: 2016-07-31 | Discharge: 2016-07-31 | Disposition: A | Discharge status: Home / Self Care | Payer: Commercial Managed Care - HMO | Source: Ambulatory Visit | Attending: Cardiology | Admitting: Cardiology

## 2016-07-31 DIAGNOSIS — I517 Cardiomegaly: Secondary | ICD-10-CM | POA: Insufficient documentation

## 2016-07-31 DIAGNOSIS — I77819 Aortic ectasia, unspecified site: Secondary | ICD-10-CM | POA: Diagnosis not present

## 2016-07-31 DIAGNOSIS — Z8249 Family history of ischemic heart disease and other diseases of the circulatory system: Secondary | ICD-10-CM | POA: Diagnosis not present

## 2016-07-31 DIAGNOSIS — I10 Essential (primary) hypertension: Secondary | ICD-10-CM | POA: Insufficient documentation

## 2016-07-31 DIAGNOSIS — I4891 Unspecified atrial fibrillation: Secondary | ICD-10-CM | POA: Insufficient documentation

## 2016-07-31 DIAGNOSIS — I340 Nonrheumatic mitral (valve) insufficiency: Secondary | ICD-10-CM | POA: Diagnosis not present

## 2016-07-31 DIAGNOSIS — E785 Hyperlipidemia, unspecified: Secondary | ICD-10-CM | POA: Insufficient documentation

## 2016-07-31 DIAGNOSIS — I371 Nonrheumatic pulmonary valve insufficiency: Secondary | ICD-10-CM | POA: Diagnosis not present

## 2016-07-31 DIAGNOSIS — I351 Nonrheumatic aortic (valve) insufficiency: Secondary | ICD-10-CM | POA: Insufficient documentation

## 2016-07-31 DIAGNOSIS — R0602 Shortness of breath: Secondary | ICD-10-CM

## 2016-07-31 LAB — PULMONARY FUNCTION TEST
DL/VA % PRED: 122 %
DL/VA: 5.38 ml/min/mmHg/L
DLCO UNC: 24.8 ml/min/mmHg
DLCO unc % pred: 87 %
FEF 25-75 Post: 2.02 L/sec
FEF 25-75 Pre: 0.97 L/sec
FEF2575-%Change-Post: 108 %
FEF2575-%Pred-Post: 96 %
FEF2575-%Pred-Pre: 46 %
FEV1-%CHANGE-POST: 24 %
FEV1-%PRED-PRE: 54 %
FEV1-%Pred-Post: 68 %
FEV1-POST: 1.91 L
FEV1-PRE: 1.54 L
FEV1FVC-%Change-Post: 7 %
FEV1FVC-%PRED-PRE: 92 %
FEV6-%CHANGE-POST: 15 %
FEV6-%PRED-POST: 72 %
FEV6-%Pred-Pre: 62 %
FEV6-Post: 2.62 L
FEV6-Pre: 2.27 L
FEV6FVC-%CHANGE-POST: 0 %
FEV6FVC-%PRED-POST: 105 %
FEV6FVC-%PRED-PRE: 106 %
FVC-%Change-Post: 15 %
FVC-%PRED-POST: 68 %
FVC-%Pred-Pre: 59 %
FVC-POST: 2.63 L
FVC-Pre: 2.27 L
POST FEV1/FVC RATIO: 73 %
POST FEV6/FVC RATIO: 100 %
PRE FEV1/FVC RATIO: 68 %
Pre FEV6/FVC Ratio: 100 %
RV % PRED: 126 %
RV: 2.95 L
TLC % PRED: 84 %
TLC: 5.42 L

## 2016-07-31 MED ORDER — ALBUTEROL SULFATE (2.5 MG/3ML) 0.083% IN NEBU
2.5000 mg | INHALATION_SOLUTION | Freq: Once | RESPIRATORY_TRACT | Status: AC
  Administered 2016-07-31: 2.5 mg via RESPIRATORY_TRACT

## 2016-08-01 ENCOUNTER — Other Ambulatory Visit: Payer: Self-pay | Admitting: *Deleted

## 2016-08-01 DIAGNOSIS — I77810 Thoracic aortic ectasia: Secondary | ICD-10-CM

## 2016-08-02 ENCOUNTER — Other Ambulatory Visit: Payer: Self-pay | Admitting: *Deleted

## 2016-08-02 DIAGNOSIS — J449 Chronic obstructive pulmonary disease, unspecified: Secondary | ICD-10-CM

## 2016-08-24 ENCOUNTER — Institutional Professional Consult (permissible substitution): Payer: Commercial Managed Care - HMO | Admitting: Internal Medicine

## 2016-08-24 ENCOUNTER — Ambulatory Visit (INDEPENDENT_AMBULATORY_CARE_PROVIDER_SITE_OTHER): Payer: Commercial Managed Care - HMO | Admitting: Internal Medicine

## 2016-08-24 ENCOUNTER — Encounter: Payer: Self-pay | Admitting: Internal Medicine

## 2016-08-24 VITALS — BP 144/68 | HR 60 | Ht 67.5 in | Wt 211.2 lb

## 2016-08-24 DIAGNOSIS — J4540 Moderate persistent asthma, uncomplicated: Secondary | ICD-10-CM | POA: Diagnosis not present

## 2016-08-24 LAB — NITRIC OXIDE: NITRIC OXIDE: 95

## 2016-08-24 MED ORDER — PREDNISONE 10 MG PO TABS: ORAL_TABLET | ORAL | 0 refills | Status: DC

## 2016-08-24 MED ORDER — FLUTICASONE FUROATE-VILANTEROL 100-25 MCG/INH IN AEPB: 1.0000 | INHALATION_SPRAY | Freq: Every day | RESPIRATORY_TRACT | 5 refills | Status: DC

## 2016-08-24 MED ORDER — ALBUTEROL SULFATE HFA 108 (90 BASE) MCG/ACT IN AERS: 2.0000 | INHALATION_SPRAY | Freq: Four times a day (QID) | RESPIRATORY_TRACT | 6 refills | Status: DC | PRN

## 2016-08-24 NOTE — Patient Instructions (Signed)
ICD-9-CM ICD-10-CM   1. Moderate persistent asthma, unspecified whether complicated [id number]    Start albuterol as needed Start breo daily - take samole  - rinse mouth after use Please take prednisone 40 mg x1 day, then 30 mg x1 day, then 20 mg x1 day, then 10 mg x1 day, and then 5 mg x1 day and stop  Flu shot any time 1-3 weeks from now  Followup  - 4-8 weeks with myself (AM appt only) or APP  ACQ and feno at followup

## 2016-08-24 NOTE — Progress Notes (Signed)
Subjective:    Patient ID: Jared Whitaker, male    DOB: [birth year], 70 y.o.   MRN: [id number]  PCP Gennette Pac, MD   HPI  IOV 08/24/2016  Chief Complaint  Patient presents with  . Pulmonary Consult    Pt referred by Dr. [name] for COPD. Pt states he has intermittent SOB with activity, throat clearing at night - occassional mucus production clear to light yellow in color. Pt denies CP/tightness and f/c/s.     70 year old male. Nonsmoker. Former Special educational needs teacher. Only medical history is failed cardioversion with A. fib. He is always in slow A. fib. He drinks a lot of for this. He is not on any rate control medications. His mom had asthma. [place]. His dad smokes heavily. He was exposed to passive smoking from the age of college. After that no passive smoking no active smoking. Has been in stable health except for the last 1 year has noticed insidious onset of shortness of breath puncture weighted by episodes of shortness of breath. First episode was 6-7 months ago that came on subacutely and he needed to rest in the long chair for several hours before he felt better. Second episode was early September 2017. He had to call EMS. His pulse ox was low normal. Apparently EKG was normal. He was reassured was an anxiety attack. He thought he had active atrial fibrillation. This then resulted in a visit with Dr. [name] who did an extensive workup documented below and as per review of the chart. It was deemed that it for fibrillation was not the issue and therefore he has been referred here. He tells me that in between these episodes he does have random shortness of breath that is variable. Sometimes with mild exertion. But sometimes is able to do heavy exertion such as mowing the yard without any problems. Temperature changes scan make the shortness of breath worse. Viral infections to make the episodes significantly worse. He has never tried any inhalers.  Workup resulted in pulmonary function test documented below and no suspicion of COPD and he is been referred here.  Pulmonary function test 07/31/2016: FVC 2.27 L/59%, FEV1 1.54 L/54% and a ratio 68 suggestive of mixed obstruction  Total lung capacity 5.4 L/84% and normal. DLC of 24.8/87% and normal. FEV1 postbronchodilator 1.9/68% which is a 3 60 mL degree improvement/24%  feNo 95ppb    Chest x-ray September 2017: Personally visualized and is clear   has a past medical history of Anemia; Anemia; Colon polyp; Contact dermatitis; Dilated aortic root ([place]); Hyperlipidemia; Hypertension; Normal cardiac stress test; Osteoarthritis; Overweight; Persistent atrial fibrillation ([place]); Shortness of breath; and Snoring.   reports that he has never smoked. He has never used smokeless tobacco.  Past Surgical History:  Procedure Laterality Date  . CARDIOVERSION N/A 03/03/2014   Procedure: CARDIOVERSION;  Surgeon: Carlena Bjornstad, MD;  Location: [facility] ENDOSCOPY;  Service: Cardiovascular;  Laterality: N/A;  . CARDIOVERSION N/A 01/31/2015   Procedure: CARDIOVERSION;  Surgeon: Carlena Bjornstad, MD;  Location: [place];  Service: Cardiovascular;  Laterality: N/A;  . COLONOSCOPY    . left leg fracture  1969    Allergies  Allergen Reactions  . Penicillins Rash    Purple spots, swelling     There is no immunization history on file for this patient.  Family History  Problem Relation Age of Onset  . CVA Mother   . Heart attack Mother   . Heart  attack Father     smoker  . Bladder Cancer Father   . Cancer Paternal Uncle      Current Outpatient Prescriptions:  [name]  Melatonin 2.5 MG CAPS, Take 1 capsule by mouth at bedtime as needed., Disp: , Rfl:  .  XARELTO 20 MG TABS tablet, TAKE 1 TABLET (20 MG TOTAL) BY MOUTH DAILY WITH SUPPER., Disp: 30 tablet, Rfl: 9    Review of Systems  Constitutional: Negative for fever and unexpected weight change.  HENT: Negative for congestion, dental problem, ear  pain, nosebleeds, postnasal drip, rhinorrhea, sinus pressure, sneezing, sore throat and trouble swallowing.   Eyes: Negative for redness and itching.  Respiratory: Positive for cough and shortness of breath. Negative for chest tightness and wheezing.   Cardiovascular: Negative for palpitations and leg swelling.  Gastrointestinal: Negative for nausea and vomiting.  Genitourinary: Negative for dysuria.  Musculoskeletal: Negative for joint swelling.  Skin: Negative for rash.  Neurological: Negative for headaches.  Hematological: Does not bruise/bleed easily.  Psychiatric/Behavioral: Negative for dysphoric mood. The patient is not nervous/anxious.        Objective:   Physical Exam  Constitutional: He is oriented to person, place, and time. He appears well-developed and well-nourished. No distress.  HENT:  Head: Normocephalic and atraumatic.  Right Ear: External ear normal.  Left Ear: External ear normal.  Mouth/Throat: Oropharynx is clear and moist. No oropharyngeal exudate.  Eyes: Conjunctivae and EOM are normal. Pupils are equal, round, and reactive to light. Right eye exhibits no discharge. Left eye exhibits no discharge. No scleral icterus.  Neck: Normal range of motion. Neck supple. No JVD present. No tracheal deviation present. No thyromegaly present.  Cardiovascular: Normal rate, regular rhythm and intact distal pulses.  Exam reveals no gallop and no friction rub.   No murmur heard. Pulmonary/Chest: Effort normal and breath sounds normal. No respiratory distress. He has no wheezes. He has no rales. He exhibits no tenderness.  Very mildly labored even on talking to me.  Abdominal: Soft. Bowel sounds are normal. He exhibits no distension and no mass. There is no tenderness. There is no rebound and no guarding.  Musculoskeletal: Normal range of motion. He exhibits no edema or tenderness.  Lymphadenopathy:    He has no cervical adenopathy.  Neurological: He is alert and oriented to  person, place, and time. He has normal reflexes. No cranial nerve deficit. Coordination normal.  Skin: Skin is warm and dry. No rash noted. He is not diaphoretic. No erythema. No pallor.  Psychiatric: He has a normal mood and affect. His behavior is normal. Judgment and thought content normal.  Nursing note and vitals reviewed.   Vitals:   08/24/16 1208  BP: (!) 144/68  Pulse: 60  SpO2: 98%  Weight: 211 lb 3.2 oz (95.8 kg)  Height: 5' 7.5" (1.715 m)         Assessment & Plan:     ICD-9-CM ICD-10-CM   1. Moderate persistent asthma, unspecified whether complicated 123456 [id number]    - new diagnosis given   Start albuterol as needed Start breo daily - take samole  - rinse mouth after use Please take prednisone 40 mg x1 day, then 30 mg x1 day, then 20 mg x1 day, then 10 mg x1 day, and then 5 mg x1 day and stop  Flu shot any time 1-3 weeks from now  Followup  - 4-8 weeks with myself (AM appt only) or APP  ACQ and feno at followup   Dr. Belva Crome  Chase Caller, M.D., F.C.C.P Pulmonary and Critical Care Medicine Staff Physician [place] [facility] Pager: [phone], If no answer or between  15:00h - 7:00h: call [phone]  08/24/2016 12:43 PM

## 2016-09-19 DIAGNOSIS — D126 Benign neoplasm of colon, unspecified: Secondary | ICD-10-CM | POA: Diagnosis not present

## 2016-09-19 DIAGNOSIS — D124 Benign neoplasm of descending colon: Secondary | ICD-10-CM | POA: Diagnosis not present

## 2016-09-19 DIAGNOSIS — Z86010 Personal history of colonic polyps: Secondary | ICD-10-CM | POA: Diagnosis not present

## 2016-09-25 DIAGNOSIS — D126 Benign neoplasm of colon, unspecified: Secondary | ICD-10-CM | POA: Diagnosis not present

## 2016-10-12 ENCOUNTER — Ambulatory Visit (INDEPENDENT_AMBULATORY_CARE_PROVIDER_SITE_OTHER): Payer: Commercial Managed Care - HMO | Admitting: Internal Medicine

## 2016-10-12 ENCOUNTER — Ambulatory Visit: Payer: Commercial Managed Care - HMO | Admitting: Internal Medicine

## 2016-10-12 ENCOUNTER — Encounter: Payer: Self-pay | Admitting: Internal Medicine

## 2016-10-12 VITALS — BP 124/86 | HR 55 | Ht 67.5 in | Wt 211.0 lb

## 2016-10-12 DIAGNOSIS — J4540 Moderate persistent asthma, uncomplicated: Secondary | ICD-10-CM | POA: Diagnosis not present

## 2016-10-12 DIAGNOSIS — Z23 Encounter for immunization: Secondary | ICD-10-CM

## 2016-10-12 NOTE — Patient Instructions (Addendum)
ICD-9-CM ICD-10-CM   1. Asthma, well controlled, moderate persistent 493.90 J45.40     Improve and well controlled  Plan Continue breo daily Use albuterol as needed Avoid exposure to flu and other viruses to extent possible Any flu exposure please call us for preventative tamiflu Flu shot high dose 10/12/2016 Shingles vaccine via PCP Gennette Pac, MD   Followup  6 months with spirometry at followup

## 2016-10-12 NOTE — Progress Notes (Signed)
Subjective:     Patient ID: Jared Whitaker, male   DOB: 04-08-1946, 70 y.o.   MRN: [id number]  HPI  PCP Gennette Pac, MD   HPI  IOV 08/24/2016  Chief Complaint  Patient presents with  . Pulmonary Consult    Pt referred by Dr. [name] for COPD. Pt states he has intermittent SOB with activity, throat clearing at night - occassional mucus production clear to light yellow in color. Pt denies CP/tightness and f/c/s.     70 year old male. Nonsmoker. Former Special educational needs teacher. Only medical history is failed cardioversion with A. fib. He is always in slow A. fib. He drinks a lot of for this. He is not on any rate control medications. His mom had asthma. [place]. His dad smokes heavily. He was exposed to passive smoking from the age of college. After that no passive smoking no active smoking. Has been in stable health except for the last 1 year has noticed insidious onset of shortness of breath puncture weighted by episodes of shortness of breath. First episode was 6-7 months ago that came on subacutely and he needed to rest in the long chair for several hours before he felt better. Second episode was early September 2017. He had to call EMS. His pulse ox was low normal. Apparently EKG was normal. He was reassured was an anxiety attack. He thought he had active atrial fibrillation. This then resulted in a visit with Dr. [name] who did an extensive workup documented below and as per review of the chart. It was deemed that it for fibrillation was not the issue and therefore he has b een referred here. He tells me that in between these episodes he does have random shortness of breath that is variable. Sometimes with mild exertion. But sometimes is able to do heavy exertion such as mowing the yard without any problems. Temperature changes scan make the shortness of breath worse. Viral infections to make the episodes significantly worse. He has never tried any  inhalers. Workup resulted in pulmonary function test documented below and no suspicion of COPD and he is been referred here.  Pulmonary function test 07/31/2016: FVC 2.27 L/59%, FEV1 1.54 L/54% and a ratio 68 suggestive of mixed obstruction  Total lung capacity 5.4 L/84% and normal. DLC of 24.8/87% and normal. FEV1 postbronchodilator 1.9/68% which is a 3 60 mL degree improvement/24%  feNo 95ppb    Chest x-ray September 2017: Personally visualized and is clear    OV 10/12/2016    Chief Complaint  Patient presents with  . Follow-up    Pt states he is doing well on the [facility]. Pt has not needed the aluterol hfa. Pt denies significant cough, denies CP/tightness.     Follow-up newly diagnosed asthma at last visit. He is now on Brio daily. He says he feels fantastic after starting Brio. His asthma control questionnaire shows symptom score of 0. He does not wake up in the night anymore. When he wakes up his symptoms. He is not limited in his activities. He does not have any shortness of breath with activities. He does not wheeze. He does not use albuterol for rescue. Exhaled nitric oxide show significant improvement from 95 at last visit the 32. He simply amazed about the improvement he has had. His strong family history of asthma. He has not had his flu shot and will have it today.   Asthma Control Panel 08/24/16 - new 10/12/2016   Current  Med Regimen none breo  ACQ 5 point- 1 week. wtd avg score. <1.0 is good control 0.75-1.25 is grey zone. >1.25 poor control. Delta 0.5 is clinically meaningful  0  FeNO ppB 1.54L/54%   FeV1  95 32  Planned intervention  for visit  Continue breo      has a past medical history of Anemia; Anemia; Colon polyp; Contact dermatitis; Dilated aortic root ([place]); Hyperlipidemia; Hypertension; Normal cardiac stress test; Osteoarthritis; Overweight; Persistent atrial fibrillation ([place]); Shortness of breath; and Snoring.   reports that he has never smoked. He has never  used smokeless tobacco.  Past Surgical History:  Procedure Laterality Date  . CARDIOVERSION N/A 03/03/2014   Procedure: CARDIOVERSION;  Surgeon: Carlena Bjornstad, MD;  Location: [facility] ENDOSCOPY;  Service: Cardiovascular;  Laterality: N/A;  . CARDIOVERSION N/A 01/31/2015   Procedure: CARDIOVERSION;  Surgeon: Carlena Bjornstad, MD;  Location: [place];  Service: Cardiovascular;  Laterality: N/A;  . COLONOSCOPY    . left leg fracture  1969    Allergies  Allergen Reactions  . Penicillins Rash    Purple spots, swelling     There is no immunization history on file for this patient.  Family History  Problem Relation Age of Onset  . CVA Mother   . Heart attack Mother   . Heart attack Father     smoker  . Bladder Cancer Father   . Cancer Paternal Uncle      Current Outpatient Prescriptions:  .  albuterol (PROVENTIL HFA;VENTOLIN HFA) 108 (90 Base) MCG/ACT inhaler, Inhale 2 puffs into the lungs every 6 (six) hours as needed for wheezing or shortness of breath., Disp: 1 Inhaler, Rfl: 6 .  fluticasone furoate-vilanterol (BREO ELLIPTA) 100-25 MCG/INH AEPB, Inhale 1 puff into the lungs daily., Disp: 60 each, Rfl: 5 .  Melatonin 2.5 MG CAPS, Take 1 capsule by mouth at bedtime as needed., Disp: , Rfl:  .  XARELTO 20 MG TABS tablet, TAKE 1 TABLET (20 MG TOTAL) BY MOUTH DAILY WITH SUPPER., Disp: 30 tablet, Rfl: 9    Review of Systems     Objective:   Physical Exam  Constitutional: He is oriented to person, place, and time. He appears well-developed and well-nourished. No distress.  HENT:  Head: Normocephalic and atraumatic.  Right Ear: External ear normal.  Left Ear: External ear normal.  Mouth/Throat: Oropharynx is clear and moist. No oropharyngeal exudate.  Eyes: Conjunctivae and EOM are normal. Pupils are equal, round, and reactive to light. Right eye exhibits no discharge. Left eye exhibits no discharge. No scleral icterus.  Neck: Normal range of motion. Neck supple. No JVD present. No  tracheal deviation present. No thyromegaly present.  Cardiovascular: Normal rate, regular rhythm and intact distal pulses.  Exam reveals no gallop and no friction rub.   No murmur heard. Pulmonary/Chest: Effort normal and breath sounds normal. No respiratory distress. He has no wheezes. He has no rales. He exhibits no tenderness.  Abdominal: Soft. Bowel sounds are normal. He exhibits no distension and no mass. There is no tenderness. There is no rebound and no guarding.  Musculoskeletal: Normal range of motion. He exhibits no edema or tenderness.  Lymphadenopathy:    He has no cervical adenopathy.  Neurological: He is alert and oriented to person, place, and time. He has normal reflexes. No cranial nerve deficit. Coordination normal.  Skin: Skin is warm and dry. No rash noted. He is not diaphoretic. No erythema. No pallor.  Psychiatric: He has a normal mood and  affect. His behavior is normal. Judgment and thought content normal.  Nursing note and vitals reviewed.   Vitals:   10/12/16 1208  BP: 124/86  Pulse: (!) 55  SpO2: 98%  Weight: 211 lb (95.7 kg)  Height: 5' 7.5" (1.715 m)    Estimated body mass index is 32.56 kg/m as calculated from the following:   Height as of this encounter: 5' 7.5" (1.715 m).   Weight as of this encounter: 211 lb (95.7 kg).      Assessment:       ICD-9-CM ICD-10-CM   1. Asthma, well controlled, moderate persistent 493.90 J45.40        Plan:      Improve and well controlled  Plan Continue breo daily Use albuterol as needed Avoid exposure to flu and other viruses to extent possible Any flu exposure please call us for preventative tamiflu Flu shot high dose 10/12/2016 Shingles vaccine via PCP Gennette Pac, MD   Followup  6 months with spirometry at followup   Dr. Brand Males, M.D., [facility].C.P Pulmonary and Critical Care Medicine Staff Physician [place] [facility] Pager: [phone], If no  answer or between  15:00h - 7:00h: call [phone]  10/12/2016 12:33 PM

## 2016-10-18 DIAGNOSIS — Z961 Presence of intraocular lens: Secondary | ICD-10-CM | POA: Diagnosis not present

## 2016-11-19 ENCOUNTER — Other Ambulatory Visit: Payer: Self-pay | Admitting: Cardiology

## 2017-01-31 DIAGNOSIS — L570 Actinic keratosis: Secondary | ICD-10-CM | POA: Diagnosis not present

## 2017-01-31 DIAGNOSIS — X32XXXD Exposure to sunlight, subsequent encounter: Secondary | ICD-10-CM | POA: Diagnosis not present

## 2017-01-31 DIAGNOSIS — L308 Other specified dermatitis: Secondary | ICD-10-CM | POA: Diagnosis not present

## 2017-02-13 DIAGNOSIS — E782 Mixed hyperlipidemia: Secondary | ICD-10-CM | POA: Diagnosis not present

## 2017-02-13 DIAGNOSIS — Z125 Encounter for screening for malignant neoplasm of prostate: Secondary | ICD-10-CM | POA: Diagnosis not present

## 2017-02-13 DIAGNOSIS — Z79899 Other long term (current) drug therapy: Secondary | ICD-10-CM | POA: Diagnosis not present

## 2017-02-13 DIAGNOSIS — D649 Anemia, unspecified: Secondary | ICD-10-CM | POA: Diagnosis not present

## 2017-02-14 DIAGNOSIS — Z0000 Encounter for general adult medical examination without abnormal findings: Secondary | ICD-10-CM | POA: Diagnosis not present

## 2017-02-14 DIAGNOSIS — N400 Enlarged prostate without lower urinary tract symptoms: Secondary | ICD-10-CM | POA: Diagnosis not present

## 2017-02-14 DIAGNOSIS — I77810 Thoracic aortic ectasia: Secondary | ICD-10-CM | POA: Diagnosis not present

## 2017-02-14 DIAGNOSIS — E782 Mixed hyperlipidemia: Secondary | ICD-10-CM | POA: Diagnosis not present

## 2017-02-14 DIAGNOSIS — J4540 Moderate persistent asthma, uncomplicated: Secondary | ICD-10-CM | POA: Diagnosis not present

## 2017-02-14 DIAGNOSIS — D649 Anemia, unspecified: Secondary | ICD-10-CM | POA: Diagnosis not present

## 2017-02-14 DIAGNOSIS — I480 Paroxysmal atrial fibrillation: Secondary | ICD-10-CM | POA: Diagnosis not present

## 2017-02-14 DIAGNOSIS — J309 Allergic rhinitis, unspecified: Secondary | ICD-10-CM | POA: Diagnosis not present

## 2017-02-14 DIAGNOSIS — Z86010 Personal history of colonic polyps: Secondary | ICD-10-CM | POA: Diagnosis not present

## 2017-04-23 ENCOUNTER — Other Ambulatory Visit: Payer: Self-pay | Admitting: Internal Medicine

## 2017-05-26 ENCOUNTER — Other Ambulatory Visit: Payer: Self-pay | Admitting: Cardiology

## 2017-05-27 NOTE — Telephone Encounter (Signed)
Pt last saw Dr [name] on 07/17/16, last labs on 07/17/16 Creat 1.11, age 71, weight 95.7kg, CrCl 81.43, based on CrCl pt is on appropriate dosage of Xarelto 20mg  QD.  Will refill rx.

## 2017-07-08 DIAGNOSIS — S20461A Insect bite (nonvenomous) of right back wall of thorax, initial encounter: Secondary | ICD-10-CM | POA: Diagnosis not present

## 2017-08-01 ENCOUNTER — Ambulatory Visit (HOSPITAL_COMMUNITY): Payer: Medicare HMO | Attending: Cardiology

## 2017-08-01 ENCOUNTER — Other Ambulatory Visit: Payer: Self-pay

## 2017-08-01 DIAGNOSIS — R0683 Snoring: Secondary | ICD-10-CM | POA: Insufficient documentation

## 2017-08-01 DIAGNOSIS — I119 Hypertensive heart disease without heart failure: Secondary | ICD-10-CM | POA: Diagnosis not present

## 2017-08-01 DIAGNOSIS — I481 Persistent atrial fibrillation: Secondary | ICD-10-CM | POA: Diagnosis not present

## 2017-08-01 DIAGNOSIS — R0602 Shortness of breath: Secondary | ICD-10-CM | POA: Diagnosis not present

## 2017-08-01 DIAGNOSIS — E669 Obesity, unspecified: Secondary | ICD-10-CM | POA: Insufficient documentation

## 2017-08-01 DIAGNOSIS — I77810 Thoracic aortic ectasia: Secondary | ICD-10-CM | POA: Diagnosis not present

## 2017-08-01 DIAGNOSIS — D649 Anemia, unspecified: Secondary | ICD-10-CM | POA: Diagnosis not present

## 2017-08-01 DIAGNOSIS — E785 Hyperlipidemia, unspecified: Secondary | ICD-10-CM | POA: Insufficient documentation

## 2017-08-01 MED ORDER — PERFLUTREN LIPID MICROSPHERE
1.0000 mL | INTRAVENOUS | Status: AC | PRN
  Administered 2017-08-01: 2 mL via INTRAVENOUS

## 2017-08-02 ENCOUNTER — Encounter: Payer: Self-pay | Admitting: Cardiology

## 2017-08-02 ENCOUNTER — Telehealth: Payer: Self-pay | Admitting: *Deleted

## 2017-08-02 DIAGNOSIS — I77810 Thoracic aortic ectasia: Secondary | ICD-10-CM

## 2017-08-02 NOTE — Telephone Encounter (Signed)
Patient informed. Recall placed for echo

## 2017-08-02 NOTE — Telephone Encounter (Signed)
-----   Message from Sueanne Margarita, MD sent at 08/02/2017  7:56 AM EDT ----- Showed normal LVF with mild LVH, mildly dilated ascending aorta, severe LAE, moderately RAE - repeat echo in 1 year limited for dilated aorta

## 2017-08-13 ENCOUNTER — Other Ambulatory Visit: Payer: Self-pay | Admitting: Internal Medicine

## 2017-08-13 DIAGNOSIS — R0600 Dyspnea, unspecified: Secondary | ICD-10-CM

## 2017-08-14 ENCOUNTER — Ambulatory Visit (INDEPENDENT_AMBULATORY_CARE_PROVIDER_SITE_OTHER): Payer: Medicare HMO | Admitting: Internal Medicine

## 2017-08-14 ENCOUNTER — Encounter: Payer: Self-pay | Admitting: Internal Medicine

## 2017-08-14 VITALS — BP 118/78 | HR 67 | Ht 67.0 in | Wt 215.0 lb

## 2017-08-14 DIAGNOSIS — R0600 Dyspnea, unspecified: Secondary | ICD-10-CM | POA: Diagnosis not present

## 2017-08-14 DIAGNOSIS — R42 Dizziness and giddiness: Secondary | ICD-10-CM | POA: Diagnosis not present

## 2017-08-14 DIAGNOSIS — J4540 Moderate persistent asthma, uncomplicated: Secondary | ICD-10-CM | POA: Diagnosis not present

## 2017-08-14 LAB — PULMONARY FUNCTION TEST
FEF 25-75 POST: 1.4 L/s
FEF 25-75 PRE: 0.99 L/s
FEF2575-%Change-Post: 41 %
FEF2575-%PRED-PRE: 48 %
FEF2575-%Pred-Post: 68 %
FEV1-%Change-Post: 9 %
FEV1-%PRED-POST: 67 %
FEV1-%PRED-PRE: 61 %
FEV1-POST: 1.87 L
FEV1-Pre: 1.71 L
FEV1FVC-%Change-Post: 5 %
FEV1FVC-%PRED-PRE: 94 %
FEV6-%CHANGE-POST: 4 %
FEV6-%PRED-POST: 72 %
FEV6-%Pred-Pre: 69 %
FEV6-POST: 2.58 L
FEV6-Pre: 2.47 L
FEV6FVC-%CHANGE-POST: 0 %
FEV6FVC-%PRED-POST: 106 %
FEV6FVC-%Pred-Pre: 105 %
FVC-%Change-Post: 3 %
FVC-%PRED-POST: 67 %
FVC-%Pred-Pre: 65 %
FVC-Post: 2.58 L
FVC-Pre: 2.48 L
PRE FEV1/FVC RATIO: 69 %
Post FEV1/FVC ratio: 72 %
Post FEV6/FVC ratio: 100 %
Pre FEV6/FVC Ratio: 99 %

## 2017-08-14 NOTE — Progress Notes (Signed)
PFT done today. 

## 2017-08-14 NOTE — Patient Instructions (Addendum)
ICD-10-CM   1. Asthma, well controlled, moderate persistent J45.40   2. Dizziness R42      Well controlled on symptoms with breo Lung functinon better but at 2/3rd normal and so in persistent range  Plan - given good subjective control, I do not feel we need to escalate treatment  - continue breo daily - take samples  - talk to [facility] and ask for 90 day supply or alternatives that might be cheaper (eg: symbicort, dulera, advair, airduo) - high dose flu shot with pcp or [facility] when able - Please talk to PCP [name], MD -  and ensure you get  shingarix vaccine - albuterol as needed  - discuss dizziness with PCP Rex Kras, Lennette Bihari, MD    followup 1 year or sooner if needed

## 2017-08-14 NOTE — Progress Notes (Signed)
Subjective:     Patient ID: Jared Whitaker, male   DOB: Jul 02, 1946, 71 y.o.   MRN: [id number]  HPI   PCP Gennette Pac, MD   HPI  IOV 08/24/2016  Chief Complaint  Patient presents with  . Pulmonary Consult    Pt referred by Dr. [name] for COPD. Pt states he has intermittent SOB with activity, throat clearing at night - occassional mucus production clear to light yellow in color. Pt denies CP/tightness and f/c/s.     71 year old male. Nonsmoker. Former [employer] educational needs teacher. Only medical history is failed cardioversion with A. fib. He is always in slow A. fib. He drinks a lot of for this. He is not on any rate control medications. His mom had asthma. [place]. His dad smokes heavily. He was exposed to passive smoking from the age of college. After that no passive smoking no active smoking. Has been in stable health except for the last 1 year has noticed insidious onset of shortness of breath puncture weighted by episodes of shortness of breath. First episode was 6-7 months ago that came on subacutely and he needed to rest in the long chair for several hours before he felt better. Second episode was early September 2017. He had to call EMS. His pulse ox was low normal. Apparently EKG was normal. He was reassured was an anxiety attack. He thought he had active atrial fibrillation. This then resulted in a visit with Dr. [name] who did an extensive workup documented below and as per review of the chart. It was deemed that it for fibrillation was not the issue and therefore he has b een referred here. He tells me that in between these episodes he does have random shortness of breath that is variable. Sometimes with mild exertion. But sometimes is able to do heavy exertion such as mowing the yard without any problems. Temperature changes scan make the shortness of breath worse. Viral infections to make the episodes significantly worse. He has never tried any  inhalers. Workup resulted in pulmonary function test documented below and no suspicion of COPD and he is been referred here.  Pulmonary function test 07/31/2016: FVC 2.27 L/59%, FEV1 1.54 L/54% and a ratio 68 suggestive of mixed obstruction  Total lung capacity 5.4 L/84% and normal. DLC of 24.8/87% and normal. FEV1 postbronchodilator 1.9/68% which is a 3 60 mL degree improvement/24%  feNo 95ppb    Chest x-ray September 2017: Personally visualized and is clear    OV 10/12/2016    Chief Complaint  Patient presents with  . Follow-up    Pt states he is doing well on the [facility]. Pt has not needed the aluterol hfa. Pt denies significant cough, denies CP/tightness.     Follow-up newly diagnosed asthma at last visit. He is now on Brio daily. He says he feels fantastic after starting Brio. His asthma control questionnaire shows symptom score of 0. He does not wake up in the night anymore. When he wakes up his symptoms. He is not limited in his activities. He does not have any shortness of breath with activities. He does not wheeze. He does not use albuterol for rescue. Exhaled nitric oxide show significant improvement from 95 at last visit the 32. He simply amazed about the improvement he has had. His strong family history of asthma. He has not had his flu shot and will have it today.   OV 08/14/2017  Chief Complaint  Patient presents with  .  Follow-up    PFT done today. States that he is doing good ever since he began taking breo. Denies any cough, SOB, or CP.    Follow-up moderate persistent asthma. Is a one year follow-up. He is on Brio daily. He feels great without any albuterol use. No interim exacerbations. No nocturnal awakenings. No daytime symptoms. He is actively moving his house and he does not feel any shortness of breath is not limited in his activities. He did spirometry today and he is improved significantly but he still in the moderate persistent range. He does not want flu shot  today because of his move  New issue is that he's complaining of dizziness. He had random dizziness while walking 2 years ago and he nearly fell down. Then yesterday while in the midst of the move he was sitting and drinking Diet Coke when he felt dizzy while sitting. There is no focal neurologic deficits. I will defer this issue to primary care physician     Asthma Control Panel 08/24/16 - new 10/12/2016  08/14/2017   Current Med Regimen none breo   ACQ 5 point- 1 week. wtd avg score. <1.0 is good control 0.75-1.25 is grey zone. >1.25 poor control. Delta 0.5 is clinically meaningful  0   fev1 1.54L/54%  1.71L /61% -> 1/87L/67%  Fno 95 32   Planned intervention  for visit  Continue breo     Review of Systems     Objective:   Physical Exam  Constitutional: He is oriented to person, place, and time. He appears well-developed and well-nourished. No distress.  HENT:  Head: Normocephalic and atraumatic.  Right Ear: External ear normal.  Left Ear: External ear normal.  Mouth/Throat: Oropharynx is clear and moist. No oropharyngeal exudate.  Eyes: Pupils are equal, round, and reactive to light. Conjunctivae and EOM are normal. Right eye exhibits no discharge. Left eye exhibits no discharge. No scleral icterus.  Neck: Normal range of motion. Neck supple. No JVD present. No tracheal deviation present. No thyromegaly present.  Cardiovascular: Normal rate, regular rhythm and intact distal pulses.  Exam reveals no gallop and no friction rub.   No murmur heard. Pulmonary/Chest: Effort normal and breath sounds normal. No respiratory distress. He has no wheezes. He has no rales. He exhibits no tenderness.  Abdominal: Soft. Bowel sounds are normal. He exhibits no distension and no mass. There is no tenderness. There is no rebound and no guarding.  Visceral obesity present  Musculoskeletal: Normal range of motion. He exhibits no edema or tenderness.  Lymphadenopathy:    He has no cervical  adenopathy.  Neurological: He is alert and oriented to person, place, and time. He has normal reflexes. No cranial nerve deficit. Coordination normal.  Skin: Skin is warm and dry. No rash noted. He is not diaphoretic. No erythema. No pallor.  Psychiatric: He has a normal mood and affect. His behavior is normal. Judgment and thought content normal.  Nursing note and vitals reviewed.  . Vitals:   08/14/17 1020  BP: 118/78  Pulse: 67  SpO2: 98%  Weight: 215 lb (97.5 kg)  Height: 5\' 7"  (1.702 m)    Estimated body mass index is 33.67 kg/m as calculated from the following:   Height as of this encounter: 5\' 7"  (1.702 m).   Weight as of this encounter: 215 lb (97.5 kg).      Assessment:       ICD-10-CM   1. Asthma, well controlled, moderate persistent J45.40   2. Dizziness  R42        Plan:      Well controlled on symptoms with breo Lung [place] better but at 2/3rd normal and so in persistent range  Plan - given good subjective control, I do not feel we need to escalate treatment  - continue breo daily - take samples  - talk to [facility] and ask for 90 day supply or alternatives that might be cheaper (eg: symbicort, dulera, advair, airduo) - high dose flu shot with pcp or [facility] when able - Please talk to PCP [name], MD -  and ensure you get  shingarix vaccine - albuterol as needed  - discuss dizziness with PCP Little, Lennette Bihari, MD    followup 1 year or sooner if needed   > 50% of this > 25 min visit spent in face to face counseling or coordination of care    Dr. Brand Males, M.D., [facility]C.P Pulmonary and Critical Care Medicine Staff Physician [place] [facility] Pager: [phone], If no answer or between  15:00h - 7:00h: call [phone]  08/14/2017 10:49 AM

## 2017-09-15 IMAGING — NM NM MISC PROCEDURE
3 series · 18 of 18 positions shown · non-contrast
Comparison: none

[Series 1: wbr_r-proj_st rest_(id)_sa · 6.5mm · 6.51mm/px · 6 of 64 frames shown]
[frame 6/64]
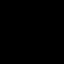
[frame 16/64]
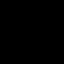
[frame 27/64]
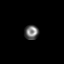
[frame 38/64]
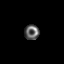
[frame 48/64]
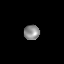
[frame 59/64]
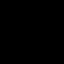

[Series 1: wbr_s-proj_st stress_(id)_sa · 6.5mm · 6.51mm/px · 6 of 64 frames shown (1 of 2)]
[frame 6/64]
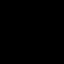
[frame 16/64]
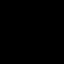
[frame 27/64]
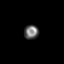
[frame 38/64]
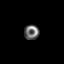
[frame 48/64]
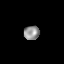
[frame 59/64]
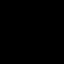

[Series 1: wbr_s-proj_st stress_(id)_sa · 6.5mm · 6.51mm/px · 6 of 512 frames shown (2 of 2)]
[frame 43/512]
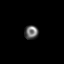
[frame 128/512]
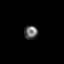
[frame 214/512]
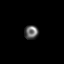
[frame 299/512]
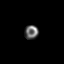
[frame 384/512]
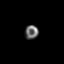
[frame 470/512]
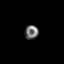

[18 of 18 positions shown; findings below may reference images not displayed]

Canned report from images found in remote index.

Refer to host system for actual result text.

## 2017-10-24 DIAGNOSIS — H524 Presbyopia: Secondary | ICD-10-CM | POA: Diagnosis not present

## 2017-11-23 ENCOUNTER — Other Ambulatory Visit: Payer: Self-pay | Admitting: Cardiology

## 2017-11-25 NOTE — Telephone Encounter (Signed)
Xarelto 20mg  refill received; Pt is 72 yrs old, Wt-97.5kg, Crea-1.08 on 02/13/17 by PCP Dr. Rex Kras, pt last seen by Dr. [name] on 07/17/16 & was due to follow up in 6 months & to date no appt set, CrCl-84.33ml/min; pt needs a Cardiology appt, will send in 1 month supply as pt needs is overdue for an appt.  Called pt to inform him that he is overdue for Cardiology appt & that he needs to schedule an appt, transferred pt to main number to schedule & pt aware only a 1 month follow-up of Xarelto will be sent as he needs an appt.

## 2017-12-17 ENCOUNTER — Ambulatory Visit: Payer: Medicare HMO | Admitting: Physician Assistant

## 2017-12-17 ENCOUNTER — Encounter: Payer: Self-pay | Admitting: Physician Assistant

## 2017-12-17 VITALS — BP 150/82 | HR 69 | Ht 66.0 in | Wt 216.1 lb

## 2017-12-17 DIAGNOSIS — I482 Chronic atrial fibrillation, unspecified: Secondary | ICD-10-CM

## 2017-12-17 DIAGNOSIS — I77810 Thoracic aortic ectasia: Secondary | ICD-10-CM | POA: Insufficient documentation

## 2017-12-17 DIAGNOSIS — E785 Hyperlipidemia, unspecified: Secondary | ICD-10-CM | POA: Diagnosis not present

## 2017-12-17 DIAGNOSIS — R030 Elevated blood-pressure reading, without diagnosis of hypertension: Secondary | ICD-10-CM | POA: Diagnosis not present

## 2017-12-17 MED ORDER — RIVAROXABAN 20 MG PO TABS: ORAL_TABLET | ORAL | 0 refills | Status: DC

## 2017-12-17 NOTE — Progress Notes (Signed)
Cardiology Office Note    Date:  12/17/2017   ID:  Jared Whitaker, DOB 1946/03/29, MRN [id number]  PCP:  [name], MD  Cardiologist: [name], MD EPS Dr. [name] No chief complaint on file.   History of Present Illness:  Jared Whitaker is a 72 y.o. male with history of chronic atrial fibrillation on Xarelto who failed flecainide.  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  He declined RFA or AAD.  He has chronic right bundle branch block, asthma and dilated ascending aorta and aortic root..  Last saw Dr. [name] 07/2016 complaining of dyspnea on exertion.  Nuclear stress test low risk with normal perfusion LVEF 57%.  Last 2D echo 07/2017 normal LV EF 55-60% with mild LVH and mildly dilated asending aorta 40 mm and aortic root 37 mm.  Patient comes in today for follow-up because his Xarelto has not been renewed.  He says he had a rough January with cough and colds and upper respiratory infections but is now feeling better.  His blood pressure is up today but he admits to eating fast food as well as hormel dinners every day and [place] food.  He did not realize this was a high salt diet.  He has never had hypertension before.  Denies chest pain, palpitations, dyspnea, dyspnea on exertion, dizziness or presyncope.    Past Medical History:  Diagnosis Date  . Anemia   . Anemia   . Colon polyp    Tubular adenoma- in colonoscopy in 07/2006 followed by GI Dr [name]  . Contact dermatitis   . Dilated aortic root ([place])    ascending aorta 41mm by echo 07/2017  . Hyperlipidemia    pateint denies this  . Hypertension    patient denies this  . Normal cardiac stress test   . Osteoarthritis    of the shoulders  . Overweight   . Persistent atrial fibrillation (HCC)    chads2vasc score is 1-2 (pt denies HTN)  . Shortness of breath   . Snoring    w/u for apnea is in progress    Past Surgical History:  Procedure Laterality  Date  . CARDIOVERSION N/A 03/03/2014   Procedure: CARDIOVERSION;  Surgeon: Carlena Bjornstad, MD;  Location: [facility] ENDOSCOPY;  Service: Cardiovascular;  Laterality: N/A;  . CARDIOVERSION N/A 01/31/2015   Procedure: CARDIOVERSION;  Surgeon: Carlena Bjornstad, MD;  Location: [facility];  Service: Cardiovascular;  Laterality: N/A;  . COLONOSCOPY    . left leg fracture  1969    Current Medications: Current Meds  Medication Sig  . albuterol (PROVENTIL HFA;VENTOLIN HFA) 108 (90 Base) MCG/ACT inhaler Inhale 2 puffs into the lungs every 6 (six) hours as needed for wheezing or shortness of breath.  [name] BREO ELLIPTA 100-25 MCG/INH AEPB INHALE 1 PUFF INTO THE LUNGS DAILY.  . Melatonin 2.5 MG CAPS Take 1 capsule by mouth at bedtime as needed.  . rivaroxaban (XARELTO) 20 MG TABS tablet TAKE 1 TABLET (20 MG TOTAL) BY MOUTH DAILY WITH SUPPER.  . [DISCONTINUED] XARELTO 20 MG TABS tablet TAKE 1 TABLET (20 MG TOTAL) BY MOUTH DAILY WITH SUPPER.     Allergies:   Penicillins   Social History   Socioeconomic History  . Marital status: Single    Spouse name: None  . Number of children: None  . Years of education: None  . Highest education level: None  Social Needs  . Financial resource strain: None  .  Food insecurity - worry: None  . Food insecurity - inability: None  . Transportation needs - medical: None  . Transportation needs - non-medical: None  Occupational History  . None  Tobacco Use  . Smoking status: Never Smoker  . Smokeless tobacco: Never Used  . Tobacco comment: Secondhand smoke exposure  Substance and Sexual Activity  . Alcohol use: Yes    Alcohol/week: 0.6 - 1.2 oz    Types: 1 - 2 Cans of beer per week    Comment: 3 six packs a week  . Drug use: No  . Sexual activity: None  Other Topics Concern  . None  Social History Narrative   Lives in [place].  Works an a Nurse, adult.     Family History:  The patient's family history includes Bladder Cancer in his father; CVA  in his mother; Cancer in his paternal uncle; Heart attack in his father and mother.   ROS:   Please see the history of present illness.    Review of Systems  Constitution: Negative.  HENT: Negative.   Cardiovascular: Negative.   Respiratory: Positive for cough.   Endocrine: Negative.   Hematologic/Lymphatic: Negative.   Musculoskeletal: Negative.   Gastrointestinal: Negative.   Genitourinary: Negative.   Neurological: Negative.    All other systems reviewed and are negative.   PHYSICAL EXAM:   VS:  BP (!) 150/82   Pulse 69   Ht 5\' 6"  (1.676 m)   Wt 216 lb 1.9 oz (98 kg)   BMI 34.88 kg/m   Physical Exam  GEN: Obese, in no acute distress  Neck: no JVD, carotid bruits, or masses Cardiac: Irregular irregular; no murmurs, rubs, or gallops  Respiratory:  clear to auscultation bilaterally, normal work of breathing GI: soft, nontender, nondistended, + BS Ext: without cyanosis, clubbing, or edema, Good distal pulses bilaterally Neuro:  Alert and Oriented x 3 Psych: euthymic mood, full affect  Wt Readings from Last 3 Encounters:  12/17/17 216 lb 1.9 oz (98 kg)  08/14/17 215 lb (97.5 kg)  10/12/16 211 lb (95.7 kg)      Studies/Labs Reviewed:   EKG:  EKG is  ordered today.  The ekg ordered today demonstrates atrial fibrillation with controlled ventricular rate, no acute change  Recent Labs: No results found for requested labs within last 8760 hours.   Lipid Panel No results found for: CHOL, TRIG, HDL, CHOLHDL, VLDL, LDLCALC, LDLDIRECT  Additional studies/ records that were reviewed today include:  Nuclear stress test 07/2016 Study Highlights      Nuclear stress EF: 57%.  There was no ST segment deviation noted during stress.  The study is normal.  This is a low risk study.   Low risk stress nuclear study with normal perfusion and normal left ventricular regional and global systolic function.      2D echo 07/2017 Study Conclusions   - Left ventricle: The  cavity size was normal. Wall thickness was   increased in a pattern of mild LVH. Indeterminant diastolic   function (atrial fibrillation). Systolic function was normal. The   estimated ejection fraction was in the range of 55% to 60%. Wall   motion was normal; there were no regional wall motion   abnormalities. - Aortic valve: There was no stenosis. There was trivial   regurgitation. - Aorta: Mildly dilated ascending aorta and aortic root. Aortic   root dimension: 37 mm (ED). Ascending aortic diameter: 40 mm (S). - Mitral valve: Mildly calcified annulus. There was trivial  regurgitation. - Left atrium: The atrium was severely dilated. - Right ventricle: The cavity size was normal. Systolic function   was normal. - Right atrium: The atrium was moderately dilated. - Pulmonary arteries: No complete TR doppler jet so unable to   estimate PA systolic pressure. - Inferior vena cava: The vessel was normal in size. The   respirophasic diameter changes were in the normal range (>= 50%),   consistent with normal central venous pressure.   Impressions:   - The patient was in atrial fibrillation. Normal LV size with mild   LV hypertrophy. EF 55-60%. Mildly dilated ascending aorta. Normal   RV size and systolic function. Biatrial enlargement.       ASSESSMENT:    1. Chronic atrial fibrillation ([place])   2. Ascending aorta dilation (HCC)   3. Borderline systolic HTN   4. Hyperlipidemia, unspecified hyperlipidemia type   5. Morbid obesity ([place])      PLAN:  In order of problems listed above:  Chronic atrial fibrillation on Xarelto, rate controlled not on meds.  Check CBC and renal function today.  No bleeding problems on Xarelto.  Ascending aorta dilatation 40 mm with aortic root 37 mm on echo 07/2017 will need repeat echo 07/2018  Borderline hypertension BP up today.  Patient ate in a high sodium diet.  2 g sodium diet given and patient will call us if his blood pressure remains  elevated.  Following up with primary care in April.  Hyperlipidemia will need fasting lipid panel in April.  Morbid obesity weight loss recommended.    Medication Adjustments/Labs and Tests Ordered: Current medicines are reviewed at length with the patient today.  Concerns regarding medicines are outlined above.  Medication changes, Labs and Tests ordered today are listed in the Patient Instructions below. Patient Instructions  Medication Instructions:  Your physician recommends that you continue on your current medications as directed. Please refer to the Current Medication list given to you today.   Labwork: TODAY: BMET, CBC  Your physician recommends that you have a FASTING lipid profile at your primary care doctor's office in April.   Testing/Procedures: Your physician has requested that you have an echocardiogram in September 2019. Echocardiography is a painless test that uses sound waves to create images of your heart. It provides your doctor with information about the size and shape of your heart and how well your heart's chambers and valves are working. This procedure takes approximately one hour. There are no restrictions for this procedure.    Follow-Up: Your physician wants you to follow-up in: September with Dr. [name]. You will receive a reminder letter in the mail two months in advance. If you don't receive a letter, please call our office to schedule the follow-up appointment.   Any Other Special Instructions Will Be Listed Below (If Applicable).  CALL IF YOUR BLOOD PRESSURE IS CONSISTENTLY ELEVATED OVER 135/85   Low-Sodium Eating Plan Sodium, which is an element that makes up salt, helps you maintain a healthy balance of fluids in your body. Too much sodium can increase your blood pressure and cause fluid and waste to be held in your body. Your health care provider or dietitian may recommend following this plan if you have high blood pressure (hypertension),  kidney disease, liver disease, or heart failure. Eating less sodium can help lower your blood pressure, reduce swelling, and protect your heart, liver, and kidneys. What are tips for following this plan? General guidelines  Most people on this plan should limit  their sodium intake to 1,500-2,000 mg (milligrams) of sodium each day. Reading food labels  The Nutrition Facts label lists the amount of sodium in one serving of the food. If you eat more than one serving, you must multiply the listed amount of sodium by the number of servings.  Choose foods with less than 140 mg of sodium per serving.  Avoid foods with 300 mg of sodium or more per serving. Shopping  Look for lower-sodium products, often labeled as "low-sodium" or "no salt added."  Always check the sodium content even if foods are labeled as "unsalted" or "no salt added".  Buy fresh foods. ? Avoid canned foods and premade or frozen meals. ? Avoid canned, cured, or processed meats  Buy breads that have less than 80 mg of sodium per slice. Cooking  Eat more home-cooked food and less restaurant, buffet, and fast food.  Avoid adding salt when cooking. Use salt-free seasonings or herbs instead of table salt or sea salt. Check with your health care provider or pharmacist before using salt substitutes.  Cook with plant-based oils, such as canola, sunflower, or olive oil. Meal planning  When eating at a restaurant, ask that your food be prepared with less salt or no salt, if possible.  Avoid foods that contain MSG (monosodium glutamate). MSG is sometimes added to [place] food, bouillon, and some canned foods. What foods are recommended? The items listed may not be a complete list. Talk with your dietitian about what dietary choices are best for you. Grains Low-sodium cereals, including oats, puffed wheat and rice, and shredded wheat. Low-sodium crackers. Unsalted rice. Unsalted pasta. Low-sodium bread. Whole-grain breads and  whole-grain pasta. Vegetables Fresh or frozen vegetables. "No salt added" canned vegetables. "No salt added" tomato sauce and paste. Low-sodium or reduced-sodium tomato and vegetable juice. Fruits Fresh, frozen, or canned fruit. Fruit juice. Meats and other protein foods Fresh or frozen (no salt added) meat, poultry, seafood, and fish. Low-sodium canned tuna and salmon. Unsalted nuts. Dried peas, beans, and lentils without added salt. Unsalted canned beans. Eggs. Unsalted nut butters. Dairy Milk. Soy milk. Cheese that is naturally low in sodium, such as ricotta cheese, fresh mozzarella, or Swiss cheese Low-sodium or reduced-sodium cheese. Cream cheese. Yogurt. Fats and oils Unsalted butter. Unsalted margarine with no trans fat. Vegetable oils such as canola or olive oils. Seasonings and other foods Fresh and dried herbs and spices. Salt-free seasonings. Low-sodium mustard and ketchup. Sodium-free salad dressing. Sodium-free light mayonnaise. Fresh or refrigerated horseradish. Lemon juice. Vinegar. Homemade, reduced-sodium, or low-sodium soups. Unsalted popcorn and pretzels. Low-salt or salt-free chips. What foods are not recommended? The items listed may not be a complete list. Talk with your dietitian about what dietary choices are best for you. Grains Instant hot cereals. Bread stuffing, pancake, and biscuit mixes. Croutons. Seasoned rice or pasta mixes. Noodle soup cups. Boxed or frozen macaroni and cheese. Regular salted crackers. Self-rising flour. Vegetables Sauerkraut, pickled vegetables, and relishes. Olives. [place] fries. Onion rings. Regular canned vegetables (not low-sodium or reduced-sodium). Regular canned tomato sauce and paste (not low-sodium or reduced-sodium). Regular tomato and vegetable juice (not low-sodium or reduced-sodium). Frozen vegetables in sauces. Meats and other protein foods Meat or fish that is salted, canned, smoked, spiced, or pickled. Bacon, ham, sausage,  hotdogs, corned beef, chipped beef, packaged lunch meats, salt pork, jerky, pickled herring, anchovies, regular canned tuna, sardines, salted nuts. Dairy Processed cheese and cheese spreads. Cheese curds. Blue cheese. Feta cheese. String cheese. Regular cottage cheese. Buttermilk. Canned  milk. Fats and oils Salted butter. Regular margarine. Ghee. Bacon fat. Seasonings and other foods Onion salt, garlic salt, seasoned salt, table salt, and sea salt. Canned and packaged gravies. Worcestershire sauce. Tartar sauce. Barbecue sauce. Teriyaki sauce. Soy sauce, including reduced-sodium. Steak sauce. Fish sauce. Oyster sauce. Cocktail sauce. Horseradish that you find on the shelf. Regular ketchup and mustard. Meat flavorings and tenderizers. Bouillon cubes. Hot sauce and Tabasco sauce. Premade or packaged marinades. Premade or packaged taco seasonings. Relishes. Regular salad dressings. Salsa. Potato and tortilla chips. Corn chips and puffs. Salted popcorn and pretzels. Canned or dried soups. Pizza. Frozen entrees and pot pies. Summary  Eating less sodium can help lower your blood pressure, reduce swelling, and protect your heart, liver, and kidneys.  Most people on this plan should limit their sodium intake to 1,500-2,000 mg (milligrams) of sodium each day.  Canned, boxed, and frozen foods are high in sodium. Restaurant foods, fast foods, and pizza are also very high in sodium. You also get sodium by adding salt to food.  Try to cook at home, eat more fresh fruits and vegetables, and eat less fast food, canned, processed, or prepared foods. This information is not intended to replace advice given to you by your health care provider. Make sure you discuss any questions you have with your health care provider. Document Released: 04/20/2002 Document Revised: 10/22/2016 Document Reviewed: 10/22/2016 Elsevier Interactive Patient Education  [employer].    If you need a refill on your cardiac  medications before your next appointment, please call your pharmacy.     Signed, Ermalinda Barrios, PA-C  12/17/2017 1:24 PM    [facility] [address], [place], [place]  [address] Phone: [phone]; Fax: [phone]

## 2017-12-17 NOTE — Addendum Note (Signed)
Addended by: Drue Novel I on: 12/17/2017 01:40 PM   Modules accepted: Orders

## 2017-12-17 NOTE — Patient Instructions (Signed)
Medication Instructions:  Your physician recommends that you continue on your current medications as directed. Please refer to the Current Medication list given to you today.   Labwork: TODAY: BMET, CBC  Your physician recommends that you have a FASTING lipid profile at your primary care doctor's office in April.   Testing/Procedures: Your physician has requested that you have an echocardiogram in September 2019. Echocardiography is a painless test that uses sound waves to create images of your heart. It provides your doctor with information about the size and shape of your heart and how well your heart's chambers and valves are working. This procedure takes approximately one hour. There are no restrictions for this procedure.    Follow-Up: Your physician wants you to follow-up in: September with Dr. [name]. You will receive a reminder letter in the mail two months in advance. If you don't receive a letter, please call our office to schedule the follow-up appointment.   Any Other Special Instructions Will Be Listed Below (If Applicable).  CALL IF YOUR BLOOD PRESSURE IS CONSISTENTLY ELEVATED OVER 135/85   Low-Sodium Eating Plan Sodium, which is an element that makes up salt, helps you maintain a healthy balance of fluids in your body. Too much sodium can increase your blood pressure and cause fluid and waste to be held in your body. Your health care provider or dietitian may recommend following this plan if you have high blood pressure (hypertension), kidney disease, liver disease, or heart failure. Eating less sodium can help lower your blood pressure, reduce swelling, and protect your heart, liver, and kidneys. What are tips for following this plan? General guidelines  Most people on this plan should limit their sodium intake to 1,500-2,000 mg (milligrams) of sodium each day. Reading food labels  The Nutrition Facts label lists the amount of sodium in one serving of the food. If you  eat more than one serving, you must multiply the listed amount of sodium by the number of servings.  Choose foods with less than 140 mg of sodium per serving.  Avoid foods with 300 mg of sodium or more per serving. Shopping  Look for lower-sodium products, often labeled as "low-sodium" or "no salt added."  Always check the sodium content even if foods are labeled as "unsalted" or "no salt added".  Buy fresh foods. ? Avoid canned foods and premade or frozen meals. ? Avoid canned, cured, or processed meats  Buy breads that have less than 80 mg of sodium per slice. Cooking  Eat more home-cooked food and less restaurant, buffet, and fast food.  Avoid adding salt when cooking. Use salt-free seasonings or herbs instead of table salt or sea salt. Check with your health care provider or pharmacist before using salt substitutes.  Cook with plant-based oils, such as canola, sunflower, or olive oil. Meal planning  When eating at a restaurant, ask that your food be prepared with less salt or no salt, if possible.  Avoid foods that contain MSG (monosodium glutamate). MSG is sometimes added to [place] food, bouillon, and some canned foods. What foods are recommended? The items listed may not be a complete list. Talk with your dietitian about what dietary choices are best for you. Grains Low-sodium cereals, including oats, puffed wheat and rice, and shredded wheat. Low-sodium crackers. Unsalted rice. Unsalted pasta. Low-sodium bread. Whole-grain breads and whole-grain pasta. Vegetables Fresh or frozen vegetables. "No salt added" canned vegetables. "No salt added" tomato sauce and paste. Low-sodium or reduced-sodium tomato and vegetable juice. Fruits Fresh,  frozen, or canned fruit. Fruit juice. Meats and other protein foods Fresh or frozen (no salt added) meat, poultry, seafood, and fish. Low-sodium canned tuna and salmon. Unsalted nuts. Dried peas, beans, and lentils without added salt.  Unsalted canned beans. Eggs. Unsalted nut butters. Dairy Milk. Soy milk. Cheese that is naturally low in sodium, such as ricotta cheese, fresh mozzarella, or Swiss cheese Low-sodium or reduced-sodium cheese. Cream cheese. Yogurt. Fats and oils Unsalted butter. Unsalted margarine with no trans fat. Vegetable oils such as canola or olive oils. Seasonings and other foods Fresh and dried herbs and spices. Salt-free seasonings. Low-sodium mustard and ketchup. Sodium-free salad dressing. Sodium-free light mayonnaise. Fresh or refrigerated horseradish. Lemon juice. Vinegar. Homemade, reduced-sodium, or low-sodium soups. Unsalted popcorn and pretzels. Low-salt or salt-free chips. What foods are not recommended? The items listed may not be a complete list. Talk with your dietitian about what dietary choices are best for you. Grains Instant hot cereals. Bread stuffing, pancake, and biscuit mixes. Croutons. Seasoned rice or pasta mixes. Noodle soup cups. Boxed or frozen macaroni and cheese. Regular salted crackers. Self-rising flour. Vegetables Sauerkraut, pickled vegetables, and relishes. Olives. [place] fries. Onion rings. Regular canned vegetables (not low-sodium or reduced-sodium). Regular canned tomato sauce and paste (not low-sodium or reduced-sodium). Regular tomato and vegetable juice (not low-sodium or reduced-sodium). Frozen vegetables in sauces. Meats and other protein foods Meat or fish that is salted, canned, smoked, spiced, or pickled. Bacon, ham, sausage, hotdogs, corned beef, chipped beef, packaged lunch meats, salt pork, jerky, pickled herring, anchovies, regular canned tuna, sardines, salted nuts. Dairy Processed cheese and cheese spreads. Cheese curds. Blue cheese. Feta cheese. String cheese. Regular cottage cheese. Buttermilk. Canned milk. Fats and oils Salted butter. Regular margarine. Ghee. Bacon fat. Seasonings and other foods Onion salt, garlic salt, seasoned salt, table salt, and sea  salt. Canned and packaged gravies. Worcestershire sauce. Tartar sauce. Barbecue sauce. Teriyaki sauce. Soy sauce, including reduced-sodium. Steak sauce. Fish sauce. Oyster sauce. Cocktail sauce. Horseradish that you find on the shelf. Regular ketchup and mustard. Meat flavorings and tenderizers. Bouillon cubes. Hot sauce and Tabasco sauce. Premade or packaged marinades. Premade or packaged taco seasonings. Relishes. Regular salad dressings. Salsa. Potato and tortilla chips. Corn chips and puffs. Salted popcorn and pretzels. Canned or dried soups. Pizza. Frozen entrees and pot pies. Summary  Eating less sodium can help lower your blood pressure, reduce swelling, and protect your heart, liver, and kidneys.  Most people on this plan should limit their sodium intake to 1,500-2,000 mg (milligrams) of sodium each day.  Canned, boxed, and frozen foods are high in sodium. Restaurant foods, fast foods, and pizza are also very high in sodium. You also get sodium by adding salt to food.  Try to cook at home, eat more fresh fruits and vegetables, and eat less fast food, canned, processed, or prepared foods. This information is not intended to replace advice given to you by your health care provider. Make sure you discuss any questions you have with your health care provider. Document Released: 04/20/2002 Document Revised: 10/22/2016 Document Reviewed: 10/22/2016 Elsevier Interactive Patient Education  [employer].    If you need a refill on your cardiac medications before your next appointment, please call your pharmacy.

## 2017-12-18 LAB — CBC
Hematocrit: 38.7 % (ref 37.5–51.0)
Hemoglobin: 12.7 g/dL — ABNORMAL LOW (ref 13.0–17.7)
MCH: 31.4 pg (ref 26.6–33.0)
MCHC: 32.8 g/dL (ref 31.5–35.7)
MCV: 96 fL (ref 79–97)
PLATELETS: 262 10*3/uL (ref 150–379)
RBC: 4.04 x10E6/uL — ABNORMAL LOW (ref 4.14–5.80)
RDW: 12.7 % (ref 12.3–15.4)
WBC: 7.3 10*3/uL (ref 3.4–10.8)

## 2017-12-18 LAB — BASIC METABOLIC PANEL
BUN / CREAT RATIO: 14 (ref 10–24)
BUN: 15 mg/dL (ref 8–27)
CO2: 22 mmol/L (ref 20–29)
CREATININE: 1.05 mg/dL (ref 0.76–1.27)
Calcium: 9.5 mg/dL (ref 8.6–10.2)
Chloride: 105 mmol/L (ref 96–106)
GFR, EST AFRICAN AMERICAN: 81 mL/min/{1.73_m2} (ref >59)
GFR, EST NON AFRICAN AMERICAN: 70 mL/min/{1.73_m2} (ref >59)
Glucose: 92 mg/dL (ref 65–99)
Potassium: 4.8 mmol/L (ref 3.5–5.2)
SODIUM: 140 mmol/L (ref 134–144)

## 2018-02-03 ENCOUNTER — Other Ambulatory Visit: Payer: Self-pay | Admitting: Internal Medicine

## 2018-02-06 DIAGNOSIS — L820 Inflamed seborrheic keratosis: Secondary | ICD-10-CM | POA: Diagnosis not present

## 2018-02-06 DIAGNOSIS — L570 Actinic keratosis: Secondary | ICD-10-CM | POA: Diagnosis not present

## 2018-02-06 DIAGNOSIS — X32XXXD Exposure to sunlight, subsequent encounter: Secondary | ICD-10-CM | POA: Diagnosis not present

## 2018-03-05 DIAGNOSIS — Z125 Encounter for screening for malignant neoplasm of prostate: Secondary | ICD-10-CM | POA: Diagnosis not present

## 2018-03-05 DIAGNOSIS — J4540 Moderate persistent asthma, uncomplicated: Secondary | ICD-10-CM | POA: Diagnosis not present

## 2018-03-05 DIAGNOSIS — Z79899 Other long term (current) drug therapy: Secondary | ICD-10-CM | POA: Diagnosis not present

## 2018-03-05 DIAGNOSIS — I77810 Thoracic aortic ectasia: Secondary | ICD-10-CM | POA: Diagnosis not present

## 2018-03-05 DIAGNOSIS — E782 Mixed hyperlipidemia: Secondary | ICD-10-CM | POA: Diagnosis not present

## 2018-03-05 DIAGNOSIS — I4891 Unspecified atrial fibrillation: Secondary | ICD-10-CM | POA: Diagnosis not present

## 2018-03-05 DIAGNOSIS — D649 Anemia, unspecified: Secondary | ICD-10-CM | POA: Diagnosis not present

## 2018-03-05 DIAGNOSIS — N529 Male erectile dysfunction, unspecified: Secondary | ICD-10-CM | POA: Diagnosis not present

## 2018-03-05 DIAGNOSIS — Z0000 Encounter for general adult medical examination without abnormal findings: Secondary | ICD-10-CM | POA: Diagnosis not present

## 2018-03-24 ENCOUNTER — Other Ambulatory Visit: Payer: Self-pay | Admitting: Physician Assistant

## 2018-05-16 DIAGNOSIS — L03811 Cellulitis of head [any part, except face]: Secondary | ICD-10-CM | POA: Diagnosis not present

## 2018-05-27 DIAGNOSIS — B029 Zoster without complications: Secondary | ICD-10-CM | POA: Diagnosis not present

## 2018-08-04 ENCOUNTER — Other Ambulatory Visit: Payer: Self-pay

## 2018-08-04 ENCOUNTER — Ambulatory Visit (HOSPITAL_COMMUNITY): Payer: Medicare HMO | Attending: Physician Assistant

## 2018-08-04 DIAGNOSIS — I77810 Thoracic aortic ectasia: Secondary | ICD-10-CM | POA: Diagnosis not present

## 2018-08-04 DIAGNOSIS — E785 Hyperlipidemia, unspecified: Secondary | ICD-10-CM | POA: Insufficient documentation

## 2018-08-04 DIAGNOSIS — I361 Nonrheumatic tricuspid (valve) insufficiency: Secondary | ICD-10-CM | POA: Insufficient documentation

## 2018-08-04 DIAGNOSIS — I481 Persistent atrial fibrillation: Secondary | ICD-10-CM | POA: Diagnosis not present

## 2018-08-04 DIAGNOSIS — D649 Anemia, unspecified: Secondary | ICD-10-CM | POA: Diagnosis not present

## 2018-08-04 DIAGNOSIS — I10 Essential (primary) hypertension: Secondary | ICD-10-CM | POA: Diagnosis not present

## 2018-08-04 MED ORDER — PERFLUTREN LIPID MICROSPHERE
1.0000 mL | INTRAVENOUS | Status: AC | PRN
  Administered 2018-08-04: 2 mL via INTRAVENOUS

## 2018-08-05 ENCOUNTER — Encounter (HOSPITAL_COMMUNITY): Payer: Self-pay | Admitting: Radiology

## 2018-09-13 ENCOUNTER — Other Ambulatory Visit: Payer: Self-pay | Admitting: Cardiology

## 2018-09-15 NOTE — Telephone Encounter (Signed)
Xarelto 20mg  refill request received; pt is 72 yrs old, wt-98kg, Crea-1.05 on 12/17/17, last seen by Estella Husk on 12/17/17, CrCl-85.60ml/min; will send in refill to requested pharmacy.

## 2018-10-16 ENCOUNTER — Ambulatory Visit: Payer: Medicare HMO | Admitting: Cardiology

## 2018-10-16 ENCOUNTER — Encounter: Payer: Self-pay | Admitting: Cardiology

## 2018-10-16 VITALS — BP 145/73 | HR 75 | Ht 66.0 in | Wt 222.8 lb

## 2018-10-16 DIAGNOSIS — I482 Chronic atrial fibrillation, unspecified: Secondary | ICD-10-CM | POA: Diagnosis not present

## 2018-10-16 DIAGNOSIS — I77810 Thoracic aortic ectasia: Secondary | ICD-10-CM

## 2018-10-16 DIAGNOSIS — R030 Elevated blood-pressure reading, without diagnosis of hypertension: Secondary | ICD-10-CM

## 2018-10-16 NOTE — Patient Instructions (Signed)
Medication Instructions:  Your physician recommends that you continue on your current medications as directed. Please refer to the Current Medication list given to you today.  If you need a refill on your cardiac medications before your next appointment, please call your pharmacy.   Lab work: Today: BMET and CBC  If you have labs (blood work) drawn today and your tests are completely normal, you will receive your results only by: [name] MyChart Message (if you have MyChart) OR . A paper copy in the mail If you have any lab test that is abnormal or we need to change your treatment, we will call you to review the results.  Testing/Procedures: Your physician has requested that you have an echocardiogram around 07/2019. Echocardiography is a painless test that uses sound waves to create images of your heart. It provides your doctor with information about the size and shape of your heart and how well your heart's chambers and valves are working. This procedure takes approximately one hour. There are no restrictions for this procedure.  Follow-Up: At [facility], you and your health needs are our priority.  As part of our continuing mission to provide you with exceptional heart care, we have created designated Provider Care Teams.  These Care Teams include your primary Cardiologist (physician) and Advanced Practice Providers (APPs -  Physician Assistants and Nurse Practitioners) who all work together to provide you with the care you need, when you need it. You will need a follow up appointment in 1 years.  Please call our office 2 months in advance to schedule this appointment.  You may see [name], MD or one of the following Advanced Practice Providers on your designated Care Team:   [place], PA-C Melina Copa, PA-C . Ermalinda Barrios, PA-C

## 2018-10-16 NOTE — Progress Notes (Signed)
Cardiology Office Note:    Date:  10/16/2018   ID:  Jared Whitaker, DOB Mar 22, 1946, MRN [id number]  PCP:  [name], MD  Cardiologist:  [name], MD    Referring MD: [name], MD   Chief Complaint  Patient presents with  . Atrial Fibrillation  . Hypertension    History of Present Illness:    Jared Whitaker is a 72 y.o. male with a hx of chronic atrial fibrillation on Xarelto.  He has failed flecainide in the past.  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  He declined RFA or AAD.  He has chronic right bundle branch block, asthma and dilated ascending aorta and aortic root. Nuclear stress test low risk with normal perfusion LVEF 57% in 2017.  Last 2D echo 07/2017 normal LV EF 55-60% with mild LVH and mildly dilated asending aorta 40 mm and aortic root 37 mm.  He is here today for followup and is doing well.  He denies any chest pain or pressure, SOB, DOE, PND, orthopnea, LE edema, dizziness, palpitations or syncope. He is compliant with his meds and is tolerating meds with no SE.    Past Medical History:  Diagnosis Date  . Anemia   . Anemia   . Colon polyp    Tubular adenoma- in colonoscopy in 07/2006 followed by GI Dr [name]  . Contact dermatitis   . Dilated aortic root ([place])    ascending aorta 76mm by echo 07/2017  . Hyperlipidemia    pateint denies this  . Hypertension    patient denies this  . Normal cardiac stress test   . Osteoarthritis    of the shoulders  . Overweight   . Persistent atrial fibrillation    chads2vasc score is 1-2 (pt denies HTN)  . Shortness of breath   . Snoring    w/u for apnea is in progress    Past Surgical History:  Procedure Laterality Date  . CARDIOVERSION N/A 03/03/2014   Procedure: CARDIOVERSION;  Surgeon: Carlena Bjornstad, MD;  Location: [facility] ENDOSCOPY;  Service: Cardiovascular;  Laterality: N/A;  . CARDIOVERSION N/A 01/31/2015   Procedure: CARDIOVERSION;  Surgeon:  Carlena Bjornstad, MD;  Location: [facility];  Service: Cardiovascular;  Laterality: N/A;  . COLONOSCOPY    . left leg fracture  1969    Current Medications: Current Meds  Medication Sig  . BREO ELLIPTA 100-25 MCG/INH AEPB INHALE 1 PUFF INTO THE LUNGS DAILY.  . Melatonin 2.5 MG CAPS Take 1 capsule by mouth at bedtime as needed.  Alveda Reasons 20 MG TABS tablet TAKE 1 TABLET (20 MG TOTAL) BY MOUTH DAILY WITH SUPPER.     Allergies:   Penicillins   Social History   Socioeconomic History  . Marital status: Single    Spouse name: Not on file  . Number of children: Not on file  . Years of education: Not on file  . Highest education level: Not on file  Occupational History  . Not on file  Social Needs  . Financial resource strain: Not on file  . Food insecurity:    Worry: Not on file    Inability: Not on file  . Transportation needs:    Medical: Not on file    Non-medical: Not on file  Tobacco Use  . Smoking status: Never Smoker  . Smokeless tobacco: Never Used  . Tobacco comment: Secondhand smoke exposure  Substance and Sexual Activity  .  Alcohol use: Yes    Alcohol/week: 1.0 - 2.0 standard drinks    Types: 1 - 2 Cans of beer per week    Comment: 3 six packs a week  . Drug use: No  . Sexual activity: Not on file  Lifestyle  . Physical activity:    Days per week: Not on file    Minutes per session: Not on file  . Stress: Not on file  Relationships  . Social connections:    Talks on phone: Not on file    Gets together: Not on file    Attends religious service: Not on file    Active member of club or organization: Not on file    Attends meetings of clubs or organizations: Not on file    Relationship status: Not on file  Other Topics Concern  . Not on file  Social History Narrative   Lives in [place].  Works an a Nurse, adult.     Family History: The patient's family history includes Bladder Cancer in his father; CVA in his mother; Cancer in his  paternal uncle; Heart attack in his father and mother.  ROS:   Please see the history of present illness.    ROS  All other systems reviewed and negative.   EKGs/Labs/Other Studies Reviewed:    The following studies were reviewed today: none  EKG:  EKG is not ordered today.    Recent Labs: 12/17/2017: BUN 15; Creatinine, Ser 1.05; Hemoglobin 12.7; Platelets 262; Potassium 4.8; Sodium 140   Recent Lipid Panel No results found for: CHOL, TRIG, HDL, CHOLHDL, VLDL, LDLCALC, LDLDIRECT  Physical Exam:    VS:  BP (!) 145/73   Pulse 75   Ht 5\' 6"  (1.676 m)   Wt 222 lb 12.8 oz (101.1 kg)   SpO2 97%   BMI 35.96 kg/m     Wt Readings from Last 3 Encounters:  10/16/18 222 lb 12.8 oz (101.1 kg)  12/17/17 216 lb 1.9 oz (98 kg)  08/14/17 215 lb (97.5 kg)     GEN:  Well nourished, well developed in no acute distress HEENT: Normal NECK: No JVD; No carotid bruits LYMPHATICS: No lymphadenopathy CARDIAC: RRR, no murmurs, rubs, gallops RESPIRATORY:  Clear to auscultation without rales, wheezing or rhonchi  ABDOMEN: Soft, non-tender, non-distended MUSCULOSKELETAL:  No edema; No deformity  SKIN: Warm and dry NEUROLOGIC:  Alert and oriented x 3 PSYCHIATRIC:  Normal affect   ASSESSMENT:    1. Chronic atrial fibrillation ([place])   2. Borderline systolic HTN   3. Ascending aorta dilation (H[facility])   4. Morbid obesity ([place])    PLAN:    In order of problems listed above:  1.  Chronic atrial fibrillation - His HR is well controlled on exam today.  He will continue on Xarelto 20mg  daily for a CHDAS2VASC score of 2.  He has not had any bleeding problems.  His last creatinine was stable at 1.05 and Hbg 12.7. I will repeat a BMET and Hbg today.   2.  HTN - BP is controlled on exam today.  He is currently not on antihypertensive medication.    3.  Ascending aortic dilatation - echo 07/2018 showed normal LVF with trivial AI and mildly dilated ascending aorta/aortic root at 6mm.  I will repeat echo  07/2019.  4.  Morbid obesity - I have encouraged him to get into a routine exercise program and cut back on carbs and portions.    Medication Adjustments/Labs and Tests Ordered:  Current medicines are reviewed at length with the patient today.  Concerns regarding medicines are outlined above.  No orders of the defined types were placed in this encounter.  No orders of the defined types were placed in this encounter.   Signed, [name], MD  10/16/2018 2:29 PM    [place]

## 2018-10-17 ENCOUNTER — Telehealth: Payer: Self-pay

## 2018-10-17 LAB — BASIC METABOLIC PANEL
BUN/Creatinine Ratio: 12 (ref 10–24)
BUN: 14 mg/dL (ref 8–27)
CALCIUM: 9.4 mg/dL (ref 8.6–10.2)
CHLORIDE: 103 mmol/L (ref 96–106)
CO2: 26 mmol/L (ref 20–29)
Creatinine, Ser: 1.15 mg/dL (ref 0.76–1.27)
GFR calc Af Amer: 72 mL/min/{1.73_m2} (ref >59)
GFR calc non Af Amer: 62 mL/min/{1.73_m2} (ref >59)
GLUCOSE: 93 mg/dL (ref 65–99)
POTASSIUM: 5.2 mmol/L (ref 3.5–5.2)
Sodium: 140 mmol/L (ref 134–144)

## 2018-10-17 LAB — CBC
HEMATOCRIT: 38.5 % (ref 37.5–51.0)
HEMOGLOBIN: 12.7 g/dL — AB (ref 13.0–17.7)
MCH: 32.2 pg (ref 26.6–33.0)
MCHC: 33 g/dL (ref 31.5–35.7)
MCV: 98 fL — ABNORMAL HIGH (ref 79–97)
Platelets: 274 10*3/uL (ref 150–450)
RBC: 3.94 x10E6/uL — AB (ref 4.14–5.80)
RDW: 11.8 % — ABNORMAL LOW (ref 12.3–15.4)
WBC: 7.4 10*3/uL (ref 3.4–10.8)

## 2018-10-17 NOTE — Telephone Encounter (Signed)
LMTCB

## 2018-10-17 NOTE — Telephone Encounter (Signed)
-----   Message from Sueanne Margarita, MD sent at 10/17/2018 10:39 AM EST ----- Stable labs - continue current meds and forward to PCP

## 2018-10-27 DIAGNOSIS — H524 Presbyopia: Secondary | ICD-10-CM | POA: Diagnosis not present

## 2018-10-27 DIAGNOSIS — H52203 Unspecified astigmatism, bilateral: Secondary | ICD-10-CM | POA: Diagnosis not present

## 2018-10-27 DIAGNOSIS — Z961 Presence of intraocular lens: Secondary | ICD-10-CM | POA: Diagnosis not present

## 2018-12-14 ENCOUNTER — Other Ambulatory Visit: Payer: Self-pay | Admitting: Internal Medicine

## 2018-12-18 ENCOUNTER — Ambulatory Visit: Payer: Medicare HMO | Admitting: Internal Medicine

## 2018-12-30 ENCOUNTER — Other Ambulatory Visit: Payer: Self-pay | Admitting: *Deleted

## 2018-12-30 ENCOUNTER — Telehealth: Payer: Self-pay | Admitting: Internal Medicine

## 2018-12-30 MED ORDER — RIVAROXABAN 20 MG PO TABS: ORAL_TABLET | ORAL | 3 refills | Status: DC

## 2018-12-30 MED ORDER — FLUTICASONE FUROATE-VILANTEROL 100-25 MCG/INH IN AEPB: 1.0000 | INHALATION_SPRAY | Freq: Every day | RESPIRATORY_TRACT | 0 refills | Status: DC

## 2018-12-30 NOTE — Telephone Encounter (Addendum)
Xarelto 20mg  refill request received; pt is 73 yrs old, wt-101.1kg, Crea-1.15 on 10/16/2018; last seen by Dr. [name] on 10/16/2018, [place].35ml/min; will send in refill to requested pahrmacy.

## 2018-12-30 NOTE — Telephone Encounter (Signed)
Spoke with Patient.  He is scheduled for OV with Dr Chase Caller 01/02/19.  He was concerned with needing Breo 100 refill.  He stated that he was almost out and was concerned about weather Friday.  Last prescription was 02/03/2018, with 5 refills.He stated that pharmacy told him he needed a OV before he could get refill.  We do not have Breo samples at this time. New Breo 100 prescription sent with no refills to requested pharmacy, [facility] [address] in [place]. Nothing further at this time.

## 2019-01-02 ENCOUNTER — Ambulatory Visit: Payer: Medicare HMO | Admitting: Internal Medicine

## 2019-01-29 ENCOUNTER — Telehealth: Payer: Self-pay | Admitting: Internal Medicine

## 2019-01-29 MED ORDER — FLUTICASONE FUROATE-VILANTEROL 100-25 MCG/INH IN AEPB: 1.0000 | INHALATION_SPRAY | Freq: Every day | RESPIRATORY_TRACT | 0 refills | Status: DC

## 2019-01-29 NOTE — Telephone Encounter (Signed)
Called and spoke with patient he is aware medications have been sent in. Patient is going to reschedule once this is over. Nothing further needed.

## 2019-02-03 ENCOUNTER — Ambulatory Visit: Payer: Medicare HMO | Admitting: Internal Medicine

## 2019-06-01 ENCOUNTER — Telehealth: Payer: Self-pay | Admitting: *Deleted

## 2019-06-01 MED ORDER — RIVAROXABAN 20 MG PO TABS: ORAL_TABLET | ORAL | 1 refills | Status: DC

## 2019-06-01 NOTE — Addendum Note (Signed)
Addended by: Derrel Nip B on: 06/01/2019 01:36 PM   Modules accepted: Orders

## 2019-06-01 NOTE — Telephone Encounter (Signed)
Xarelto 20mg  refill request received from [facility]; pt is 73 yrs old, wt- 101.1kg, Crea-1.15 on 10/16/2018, last seen by Dr. [name] on 10/16/2018, diagnosis Afib, CrCl-80.81ml/min; will send in refill to requested pharmacy.

## 2019-07-22 ENCOUNTER — Other Ambulatory Visit (HOSPITAL_COMMUNITY): Payer: Medicare HMO

## 2019-08-12 DIAGNOSIS — E538 Deficiency of other specified B group vitamins: Secondary | ICD-10-CM | POA: Diagnosis not present

## 2019-08-12 DIAGNOSIS — Z79899 Other long term (current) drug therapy: Secondary | ICD-10-CM | POA: Diagnosis not present

## 2019-08-12 DIAGNOSIS — Z23 Encounter for immunization: Secondary | ICD-10-CM | POA: Diagnosis not present

## 2019-08-12 DIAGNOSIS — Z0000 Encounter for general adult medical examination without abnormal findings: Secondary | ICD-10-CM | POA: Diagnosis not present

## 2019-08-12 DIAGNOSIS — I4891 Unspecified atrial fibrillation: Secondary | ICD-10-CM | POA: Diagnosis not present

## 2019-08-12 DIAGNOSIS — D649 Anemia, unspecified: Secondary | ICD-10-CM | POA: Diagnosis not present

## 2019-08-12 DIAGNOSIS — I77810 Thoracic aortic ectasia: Secondary | ICD-10-CM | POA: Diagnosis not present

## 2019-08-12 DIAGNOSIS — E782 Mixed hyperlipidemia: Secondary | ICD-10-CM | POA: Diagnosis not present

## 2019-08-12 DIAGNOSIS — Z125 Encounter for screening for malignant neoplasm of prostate: Secondary | ICD-10-CM | POA: Diagnosis not present

## 2019-08-27 DIAGNOSIS — Z0000 Encounter for general adult medical examination without abnormal findings: Secondary | ICD-10-CM | POA: Diagnosis not present

## 2019-08-27 DIAGNOSIS — J4540 Moderate persistent asthma, uncomplicated: Secondary | ICD-10-CM | POA: Diagnosis not present

## 2019-08-27 DIAGNOSIS — E782 Mixed hyperlipidemia: Secondary | ICD-10-CM | POA: Diagnosis not present

## 2019-08-27 DIAGNOSIS — I4891 Unspecified atrial fibrillation: Secondary | ICD-10-CM | POA: Diagnosis not present

## 2019-08-27 DIAGNOSIS — N400 Enlarged prostate without lower urinary tract symptoms: Secondary | ICD-10-CM | POA: Diagnosis not present

## 2019-08-27 DIAGNOSIS — Z1159 Encounter for screening for other viral diseases: Secondary | ICD-10-CM | POA: Diagnosis not present

## 2019-08-27 DIAGNOSIS — E538 Deficiency of other specified B group vitamins: Secondary | ICD-10-CM | POA: Diagnosis not present

## 2019-08-27 DIAGNOSIS — Z79899 Other long term (current) drug therapy: Secondary | ICD-10-CM | POA: Diagnosis not present

## 2019-10-01 DIAGNOSIS — R519 Headache, unspecified: Secondary | ICD-10-CM | POA: Diagnosis not present

## 2019-10-01 DIAGNOSIS — F419 Anxiety disorder, unspecified: Secondary | ICD-10-CM | POA: Diagnosis not present

## 2019-10-01 DIAGNOSIS — R5381 Other malaise: Secondary | ICD-10-CM | POA: Diagnosis not present

## 2019-11-12 DIAGNOSIS — J4540 Moderate persistent asthma, uncomplicated: Secondary | ICD-10-CM | POA: Diagnosis not present

## 2019-11-12 DIAGNOSIS — E782 Mixed hyperlipidemia: Secondary | ICD-10-CM | POA: Diagnosis not present

## 2019-11-12 DIAGNOSIS — Z86010 Personal history of colonic polyps: Secondary | ICD-10-CM | POA: Diagnosis not present

## 2019-11-12 DIAGNOSIS — E538 Deficiency of other specified B group vitamins: Secondary | ICD-10-CM | POA: Diagnosis not present

## 2019-11-12 DIAGNOSIS — H532 Diplopia: Secondary | ICD-10-CM | POA: Diagnosis not present

## 2019-11-12 DIAGNOSIS — I77810 Thoracic aortic ectasia: Secondary | ICD-10-CM | POA: Diagnosis not present

## 2019-11-12 DIAGNOSIS — R519 Headache, unspecified: Secondary | ICD-10-CM | POA: Diagnosis not present

## 2019-11-12 DIAGNOSIS — I480 Paroxysmal atrial fibrillation: Secondary | ICD-10-CM | POA: Diagnosis not present

## 2019-11-12 DIAGNOSIS — D649 Anemia, unspecified: Secondary | ICD-10-CM | POA: Diagnosis not present

## 2019-11-25 ENCOUNTER — Other Ambulatory Visit: Payer: Self-pay | Admitting: Family Medicine

## 2019-11-25 DIAGNOSIS — R519 Headache, unspecified: Secondary | ICD-10-CM

## 2019-11-27 ENCOUNTER — Other Ambulatory Visit: Payer: Self-pay

## 2019-11-27 ENCOUNTER — Ambulatory Visit
Admission: RE | Admit: 2019-11-27 | Discharge: 2019-11-27 | Disposition: A | Discharge status: Home / Self Care | Payer: Medicare HMO | Source: Ambulatory Visit | Attending: Family Medicine | Admitting: Family Medicine

## 2019-11-27 DIAGNOSIS — R519 Headache, unspecified: Secondary | ICD-10-CM | POA: Diagnosis not present

## 2019-11-30 ENCOUNTER — Other Ambulatory Visit: Payer: Self-pay | Admitting: Family Medicine

## 2019-11-30 DIAGNOSIS — I639 Cerebral infarction, unspecified: Secondary | ICD-10-CM

## 2019-11-30 DIAGNOSIS — R519 Headache, unspecified: Secondary | ICD-10-CM

## 2019-12-01 ENCOUNTER — Other Ambulatory Visit: Payer: Self-pay

## 2019-12-01 ENCOUNTER — Ambulatory Visit
Admission: RE | Admit: 2019-12-01 | Discharge: 2019-12-01 | Disposition: A | Discharge status: Home / Self Care | Payer: Medicare HMO | Source: Ambulatory Visit | Attending: Family Medicine | Admitting: Family Medicine

## 2019-12-01 DIAGNOSIS — I639 Cerebral infarction, unspecified: Secondary | ICD-10-CM | POA: Diagnosis not present

## 2019-12-01 DIAGNOSIS — I6523 Occlusion and stenosis of bilateral carotid arteries: Secondary | ICD-10-CM | POA: Diagnosis not present

## 2019-12-08 ENCOUNTER — Encounter: Payer: Self-pay | Admitting: Neurology

## 2019-12-08 ENCOUNTER — Ambulatory Visit: Payer: Medicare HMO | Admitting: Neurology

## 2019-12-08 ENCOUNTER — Other Ambulatory Visit: Payer: Self-pay

## 2019-12-08 VITALS — BP 138/90 | HR 56 | Temp 97.5°F | Ht 68.5 in | Wt 206.0 lb

## 2019-12-08 DIAGNOSIS — R519 Headache, unspecified: Secondary | ICD-10-CM | POA: Diagnosis not present

## 2019-12-08 DIAGNOSIS — I482 Chronic atrial fibrillation, unspecified: Secondary | ICD-10-CM | POA: Diagnosis not present

## 2019-12-08 DIAGNOSIS — H9313 Tinnitus, bilateral: Secondary | ICD-10-CM

## 2019-12-08 DIAGNOSIS — I6381 Other cerebral infarction due to occlusion or stenosis of small artery: Secondary | ICD-10-CM

## 2019-12-08 DIAGNOSIS — R351 Nocturia: Secondary | ICD-10-CM

## 2019-12-08 NOTE — Progress Notes (Signed)
Subjective:    Patient ID: Jared Whitaker. is a 74 y.o. male.  HPI     Star Age, MD, PhD [facility] [address], [address] [place], [place] [address]  Dear Dr. Rex Kras, I saw your patient, Jared Whitaker, upon your kind request in my neurologic clinic today for initial consultation of his recurrent headaches.  The patient is unaccompanied today.  As you know, Mr. [name] is a 74 year old right-handed gentleman with an underlying medical history of A. fib, osteoarthritis, hypertension, hyperlipidemia, anemia, allergic rhinitis, BPH, vitamin B12 deficiency, anxiety, and obesity, who reports recurrent headaches for the past few months, started in or around September.  He states that he woke up with headaches, these are dull and achy and typically bifrontal.  He has had no associated nausea or vomiting but has had intermittent ringing in the ears bilaterally.  Lately, the ringing in the ears improved.  He has a longstanding history of difficulty sleeping.  He has taken melatonin off and on, currently not on it.  He was advised to start taking oral vitamin B12 somewhere in or around October.  He has had no sudden onset of one-sided weakness or numbness or tingling.  He does have some intermittent numbness in both feet.  He lives alone, he is single.  He drinks caffeine in the form of soda typically, has tried to limit himself lately and reduced his soda intake.  He had a sleep study in August 2016 as ordered by Dr. [name].  He barely slept during that study and sleep efficiency was around 28%.  He did not have any obvious sleep disordered breathing.  He had a repeat sleep study in December 2016, again did not sleep very well but was able to achieve enough sleep, no obvious sleep disordered breathing was noted at the time.  He has nocturia about 2-3 times per average night.  I reviewed your office note from 11/12/2019.  He reports snoring.  He has had intermittent  tinnitus.  He has had intermittent double vision.  He had blood work through your office on 11/12/2019, B12 level was 604 at the time. He had a head CT without contrast on 11/27/2019 and I reviewed the results: IMPRESSION: 1. No acute intracranial abnormalities. 2. Old lacunar infarct involving the anterior limb of the right internal capsule and adjacent caudate nucleus head.   His Past Medical History Is Significant For: Past Medical History:  Diagnosis Date  . Anemia   . Anemia   . Colon polyp    Tubular adenoma- in colonoscopy in 07/2006 followed by GI Dr [name]  . Contact dermatitis   . Dilated aortic root ([place])    ascending aorta 45mm by echo 07/2017  . Hyperlipidemia    pateint denies this  . Hypertension    patient denies this  . Normal cardiac stress test   . Osteoarthritis    of the shoulders  . Overweight   . Persistent atrial fibrillation ([facility])    chads2vasc score is 1-2 (pt denies HTN)  . Shortness of breath   . Snoring    w/u for apnea is in progress    His Past Surgical History Is Significant For: Past Surgical History:  Procedure Laterality Date  . CARDIOVERSION N/A 03/03/2014   Procedure: CARDIOVERSION;  Surgeon: Carlena Bjornstad, MD;  Location: [facility] ENDOSCOPY;  Service: Cardiovascular;  Laterality: N/A;  . CARDIOVERSION N/A 01/31/2015   Procedure: CARDIOVERSION;  Surgeon: Carlena Bjornstad, MD;  Location:  [facility] ENDOSCOPY;  Service: Cardiovascular;  Laterality: N/A;  . COLONOSCOPY    . left leg fracture  1969    His Family History Is Significant For: Family History  Problem Relation Age of Onset  . CVA Mother   . Heart attack Mother   . Heart attack Father        smoker  . Bladder Cancer Father   . Cancer Paternal Uncle     His Social History Is Significant For: Social History   Socioeconomic History  . Marital status: Single    Spouse name: Not on file  . Number of children: Not on file  . Years of education: Not on file  . Highest education level: Not  on file  Occupational History  . Not on file  Tobacco Use  . Smoking status: Never Smoker  . Smokeless tobacco: Never Used  . Tobacco comment: Secondhand smoke exposure  Substance and Sexual Activity  . Alcohol use: Yes    Alcohol/week: 1.0 - 2.0 standard drinks    Types: 1 - 2 Cans of beer per week    Comment: 3 six packs a week  . Drug use: No  . Sexual activity: Not on file  Other Topics Concern  . 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   Social Determinants of Health   Financial Resource Strain:   . Difficulty of Paying Living Expenses: Not on file  Food Insecurity:   . Worried About Charity fundraiser in the Last Year: Not on file  . Ran Out of Food in the Last Year: Not on file  Transportation Needs:   . Lack of Transportation (Medical): Not on file  . Lack of Transportation (Non-Medical): Not on file  Physical Activity:   . Days of Exercise per Week: Not on file  . Minutes of Exercise per Session: Not on file  Stress:   . Feeling of Stress : Not on file  Social Connections:   . Frequency of Communication with Friends and Family: Not on file  . Frequency of Social Gatherings with Friends and Family: Not on file  . Attends Religious Services: Not on file  . Active Member of Clubs or Organizations: Not on file  . Attends Archivist Meetings: Not on file  . Marital Status: Not on file    His Allergies Are:  Allergies  Allergen Reactions  . Penicillins Rash    Purple spots, swelling  :   His Current Medications Are:  Outpatient Encounter Medications as of 12/08/2019  Medication Sig  . Cyanocobalamin (VITAMIN B 12 PO) Take by mouth. Daily  . fluticasone furoate-vilanterol (BREO ELLIPTA) 100-25 MCG/INH AEPB Inhale 1 puff into the lungs daily.  . rivaroxaban (XARELTO) 20 MG TABS tablet TAKE 1 TABLET (20 MG TOTAL) BY MOUTH DAILY WITH SUPPER.  . [DISCONTINUED] Melatonin 2.5 MG CAPS Take 1  capsule by mouth at bedtime as needed.   No facility-administered encounter medications on file as of 12/08/2019.  :   Review of Systems:  Out of a complete 14 point review of systems, all are reviewed and negative with the exception of these symptoms as listed below: Review of Systems  Neurological:       Reports he is here to discuss headaches.  Epworth Sleepiness Scale 0= would never doze 1= slight chance of dozing 2= moderate chance of dozing 3= high chance of dozing  Sitting and reading:1 Watching  TV:1 Sitting inactive in a public place (ex. Theater or meeting):1 As a passenger in a car for an hour without a break:0 Lying down to rest in the afternoon:0 Sitting and talking to someone:2 Sitting quietly after lunch (no alcohol):1 In a car, while stopped in traffic:0 Total:6     Objective:  Neurological Exam  Physical Exam Physical Examination:   Vitals:   12/08/19 1412  BP: 138/90  Pulse: (!) 56  Temp: (!) 97.5 F (36.4 C)   General Examination: The patient is a very pleasant 74 y.o. male in no acute distress. He appears well-developed and well-nourished and well groomed.   HEENT: Normocephalic, atraumatic, pupils are equal, round and reactive to light and accommodation. Corrective eyeglasses in place.  He is hard of hearing.  Face is symmetric with normal facial animation.  Speech is clear without dysarthria, hypophonia or voice tremor, no carotid bruits.  Airway examination reveals moderate airway crowding, tonsils are absent but Mallampati class III, redundant soft palate noted, small airway entry noted, neck circumference of 17-7/8 inches. Tongue protrudes centrally and palate elevates symmetrically.   Chest: Clear to auscultation without wheezing, rhonchi or crackles noted.  Heart: S1+S2+0, irregular.   Abdomen: Soft, non-tender and non-distended with normal bowel sounds appreciated on auscultation.  Extremities: There is trace pitting edema in the distal  lower extremities bilaterally. Pedal pulses are intact.  Skin: Warm and dry without trophic changes noted.  Musculoskeletal: exam reveals no obvious joint deformities, tenderness or joint swelling or erythema.   Neurologically:  Mental status: The patient is awake, alert and oriented in all 4 spheres. His immediate and remote memory, attention, language skills and fund of knowledge are appropriate. There is no evidence of aphasia, agnosia, apraxia or anomia. Speech is clear with normal prosody and enunciation. Thought process is linear. Mood is normal and affect is normal.  Cranial nerves II - XII are as described above under HEENT exam. In addition: shoulder shrug is normal with equal shoulder height noted. Motor exam: Normal bulk, strength and tone is noted. There is no drift, tremor or rebound. Romberg is negative. Reflexes are 1+ in the upper extremities, 2+ in the knees and trace in the ankles, toes are downgoing bilaterally. Fine motor skills and coordination: intact with normal finger taps, normal hand movements, normal rapid alternating patting, normal foot taps and normal foot agility.  Cerebellar testing: No dysmetria or intention tremor on finger to nose testing. Heel to shin is unremarkable bilaterally. There is no truncal or gait ataxia.  Sensory exam: intact to light touch in the upper and lower extremities.  Gait, station and balance: He stands easily. No veering to one side is noted. No leaning to one side is noted. Posture is age-appropriate and stance is narrow based. Gait shows normal stride length and normal pace. No problems turning are noted.               Assessment and Plan:   In summary, Deavonte Thor. is a very pleasant 74 y.o.-year old male with an underlying medical history of A. fib, osteoarthritis, hypertension, hyperlipidemia, anemia, allergic rhinitis, BPH, vitamin B12 deficiency, anxiety, and obesity, who Presents for evaluation of new onset headaches which  started about 4 months ago.  He has had morning headaches primarily.  He has nonmigrainous headaches typically.  He may have underlying sleep disordered breathing what with his history of snoring, sleep disturbance, nocturia reported and borderline obesity.  He had 2 sleep studies in 2016.  He  is advised to proceed with a sleep study as well as a brain MRI to rule out a structural cause of his headaches.  He has had intermittent tinnitus.  If he has recurrence of tinnitus, he may benefit from seeing a ENT specialist.  He also has hearing loss and has tried over-the-counter hearing aids from [employer], as he told me.  Given his history of A. fib, treating sleep apnea would be important, I talked him about CPAP therapy.  For now, he is advised not to start any new medication quite yet.  He has tried over-the-counter Tylenol from time to time.  He tries to avoid taking any additional medication for fear of side effects. He had a lacunar stroke in the past.  He has stroke risk factors.  He is advised to limit his caffeine intake.  I plan to see him back after testing.  I answered all his questions today and he was in agreement. Thank you very much for allowing me to participate in the care of this nice patient. If I can be of any further assistance to you please do not hesitate to call me at [phone].  Sincerely,   Star Age, MD, PhD

## 2019-12-08 NOTE — Patient Instructions (Signed)
Based on your symptoms, you may have underlying apnea.  I would recommend pursuing a sleep study.  You had a sleep study in 2016 but barely slept as you indicated.  I think it would be worthwhile ruling out obstructive sleep apnea as you have woken up with headaches.  In addition, I would like to proceed with a brain MRI with and without contrast.  Should you have return of ringing in the ears, please talk to your primary care physician about seeing an ENT specialist. You neurological exam does not show any alarming findings thankfully.  We will call you to schedule Your sleep study and also your brain MRI.

## 2019-12-09 ENCOUNTER — Other Ambulatory Visit: Payer: Self-pay | Admitting: Cardiology

## 2019-12-09 LAB — COMPREHENSIVE METABOLIC PANEL
ALT: 17 IU/L (ref 0–44)
AST: 18 IU/L (ref 0–40)
Albumin/Globulin Ratio: 1.6 (ref 1.2–2.2)
Albumin: 4.1 g/dL (ref 3.7–4.7)
Alkaline Phosphatase: 96 IU/L (ref 39–117)
BUN/Creatinine Ratio: 11 (ref 10–24)
BUN: 12 mg/dL (ref 8–27)
Bilirubin Total: 0.3 mg/dL (ref 0.0–1.2)
CO2: 23 mmol/L (ref 20–29)
Calcium: 9.2 mg/dL (ref 8.6–10.2)
Chloride: 104 mmol/L (ref 96–106)
Creatinine, Ser: 1.12 mg/dL (ref 0.76–1.27)
GFR calc Af Amer: 74 mL/min/{1.73_m2} (ref >59)
GFR calc non Af Amer: 64 mL/min/{1.73_m2} (ref >59)
Globulin, Total: 2.6 g/dL (ref 1.5–4.5)
Glucose: 91 mg/dL (ref 65–99)
Potassium: 4.5 mmol/L (ref 3.5–5.2)
Sodium: 139 mmol/L (ref 134–144)
Total Protein: 6.7 g/dL (ref 6.0–8.5)

## 2019-12-09 NOTE — Telephone Encounter (Signed)
Pt last saw Dr [name] 10/17/18, overdue for follow-up.  Last labs 12/08/19 Creat 1.12, age 74, weight 93.4kg, CrCl 75.29, based on CrCl pt is on appropriate dosage of Xarelto 20mg  QD.  Will refill rx x 1 3 month supply with a note to schedule follow-up for future refills.

## 2019-12-21 ENCOUNTER — Telehealth: Payer: Self-pay | Admitting: Neurology

## 2019-12-21 ENCOUNTER — Emergency Department
Admission: EM | Admit: 2019-12-21 | Discharge: 2019-12-21 | Disposition: A | Discharge status: Home / Self Care | Payer: Medicare HMO | Attending: Emergency Medicine | Admitting: Emergency Medicine

## 2019-12-21 ENCOUNTER — Other Ambulatory Visit: Payer: Self-pay

## 2019-12-21 ENCOUNTER — Emergency Department: Payer: Medicare HMO

## 2019-12-21 ENCOUNTER — Encounter: Payer: Self-pay | Admitting: Emergency Medicine

## 2019-12-21 DIAGNOSIS — I4819 Other persistent atrial fibrillation: Secondary | ICD-10-CM | POA: Diagnosis not present

## 2019-12-21 DIAGNOSIS — M79604 Pain in right leg: Secondary | ICD-10-CM

## 2019-12-21 DIAGNOSIS — M79651 Pain in right thigh: Secondary | ICD-10-CM | POA: Diagnosis not present

## 2019-12-21 DIAGNOSIS — Z7901 Long term (current) use of anticoagulants: Secondary | ICD-10-CM | POA: Insufficient documentation

## 2019-12-21 MED ORDER — HYDROCODONE-ACETAMINOPHEN 5-325 MG PO TABS: 1.0000 | ORAL_TABLET | ORAL | 0 refills | Status: DC | PRN

## 2019-12-21 NOTE — ED Notes (Signed)
See triage note  Presents with pain to upper thigh since last pm  He developed pain to right leg while eating dinner last night  Denies any injury  No swelling Ambulates well

## 2019-12-21 NOTE — Discharge Instructions (Signed)
Follow-up with your primary care provider if any continued problems.  A prescription for hydrocodone with Tylenol was sent to the pharmacy.  This is the narcotic medication that we talked about.  This medication could cause drowsiness and increase your risk for falling.  You may use heat or ice to your leg as needed for discomfort.  Also watch the skin for any changes such as blisters or redness that is related to shingles.  Your ultrasound and x-ray were negative and does not explain your pain at this time.  If any severe worsening of your symptoms or urgent concerns return to the emergency department.

## 2019-12-21 NOTE — ED Triage Notes (Signed)
Pt states he was eating dinner last night and began having right thigh pain, denies injury to area, was unable to sleep last night due to pain, is on blood thinner. NAD.

## 2019-12-21 NOTE — Telephone Encounter (Signed)
[employer] pending faxed notes.  

## 2019-12-21 NOTE — ED Provider Notes (Signed)
[facility] Emergency Department Provider Note  ____________________________________________   First MD Initiated Contact with Patient 12/21/19 [id number]     (approximate)  I have reviewed the triage vital signs and the nursing notes.   HISTORY  Chief Complaint Leg Pain   HPI Jared Whitaker. is a 74 y.o. male presents to the ED with complaint of upper thigh pain that began last evening while he was sitting eating dinner.  Patient denies any injury to his leg.  He continues to ambulate without any assistance.  Patient states that the pain was such that he was unable to get any sleep last night.  Patient is on a blood thinner due to A. fib.  He denies any recent injury to his leg, traveling for a long period of time or previous DVT.  Rates pain as 5 out of 10.     Past Medical History:  Diagnosis Date  . Anemia   . Anemia   . Colon polyp    Tubular adenoma- in colonoscopy in 07/2006 followed by GI Dr [name]  . Contact dermatitis   . Dilated aortic root ([place])    ascending aorta 79mm by echo 07/2017  . Hyperlipidemia    pateint denies this  . Hypertension    patient denies this  . Normal cardiac stress test   . Osteoarthritis    of the shoulders  . Overweight   . Persistent atrial fibrillation (HCC)    chads2vasc score is 1-2 (pt denies HTN)  . Shortness of breath   . Snoring    w/u for apnea is in progress    Patient Active Problem List   Diagnosis Date Noted  . Ascending aorta dilation (HCC) 12/17/2017  . Asthma, well controlled, moderate persistent 10/12/2016  . Moderate persistent asthma 08/24/2016  . Borderline systolic HTN 0000000  . Snoring   . Morbid obesity ([place]) 04/14/2015  . HX: anticoagulation 02/16/2014  . Chronic atrial fibrillation ([place]) 01/15/2014  . DOE (dyspnea on exertion) 01/15/2014  . Normal cardiac stress test   . Ejection fraction   . Colon polyp   . Hyperlipidemia   . Anemia     Past Surgical History:   Procedure Laterality Date  . CARDIOVERSION N/A 03/03/2014   Procedure: CARDIOVERSION;  Surgeon: Carlena Bjornstad, MD;  Location: [facility] ENDOSCOPY;  Service: Cardiovascular;  Laterality: N/A;  . CARDIOVERSION N/A 01/31/2015   Procedure: CARDIOVERSION;  Surgeon: Carlena Bjornstad, MD;  Location: [place];  Service: Cardiovascular;  Laterality: N/A;  . COLONOSCOPY    . left leg fracture  1969    Prior to Admission medications   Medication Sig Start Date End Date Taking? Authorizing Provider  rosuvastatin (CRESTOR) 5 MG tablet Take 5 mg by mouth daily.   Yes [provider]  Cyanocobalamin (VITAMIN B 12 PO) Take by mouth. Daily    [provider]  fluticasone furoate-vilanterol (BREO ELLIPTA) 100-25 MCG/INH AEPB Inhale 1 puff into the lungs daily. 01/29/19   Brand Males, MD  HYDROcodone-acetaminophen (NORCO/VICODIN) 5-325 MG tablet Take 1 tablet by mouth every 4 (four) hours as needed for moderate pain. 12/21/19   Johnn Hai, PA-C  rivaroxaban (XARELTO) 20 MG TABS tablet Take 1 tablet (20 mg total) by mouth daily with supper. Pt is overdue for follow-up with Dr [name]. MUST SEE MD for future refills. 12/09/19   Sueanne Margarita, MD    Allergies Penicillins  Family History  Problem Relation Age of Onset  . CVA Mother   .  Heart attack Mother   . Heart attack Father        smoker  . Bladder Cancer Father   . Cancer Paternal Uncle     Social History Social History   Tobacco Use  . Smoking status: Never Smoker  . Smokeless tobacco: Never Used  . Tobacco comment: Secondhand smoke exposure  Substance Use Topics  . Alcohol use: Yes    Alcohol/week: 1.0 - 2.0 standard drinks    Types: 1 - 2 Cans of beer per week    Comment: 3 six packs a week  . Drug use: No    Review of Systems Constitutional: No fever/chills Cardiovascular: Denies chest pain. Respiratory: Denies shortness of breath. Gastrointestinal: No abdominal pain.  No nausea, no vomiting.   Genitourinary: Negative for dysuria. Musculoskeletal: Positive for right anterior lateral thigh pain. Skin: Negative for rash. Neurological: Negative for headaches, focal weakness or numbness. ____________________________________________   PHYSICAL EXAM:  VITAL SIGNS: ED Triage Vitals  Enc Vitals Group     BP 12/21/19 0854 (!) 151/98     Pulse Rate 12/21/19 0854 99     Resp 12/21/19 0854 20     Temp 12/21/19 0854 98.6 F (37 C)     Temp Source 12/21/19 0854 Oral     SpO2 12/21/19 0854 100 %     Weight 12/21/19 0855 206 lb (93.4 kg)     Height 12/21/19 0855 5\' 7"  (1.702 m)     Head Circumference --      Peak Flow --      Pain Score 12/21/19 0859 5     Pain Loc --      Pain Edu? --      Excl. in [place]? --     Constitutional: Alert and oriented. Well appearing and in no acute distress. Eyes: Conjunctivae are normal.  Head: Atraumatic. Neck: No stridor.   Cardiovascular: Normal rate, regular rhythm. Grossly normal heart sounds.  Good peripheral circulation. Respiratory: Normal respiratory effort.  No retractions. Lungs CTAB. Gastrointestinal: Soft and nontender. No distention. Musculoskeletal: On examination of the right thigh there is no deformity or soft tissue edema.  There is no significant change in comparison with his left thigh.  Skin is intact.  There is no erythema, abrasions or ecchymosis noted.  There is some tenderness noted on palpation of the right anterior lateral mid thigh area.  No point tenderness noted on palpation of the knee.  Good muscle strength bilaterally.  Bevelyn Buckles' sign was negative.  No point tenderness or pain with compression of the hips.  Patient is able to ambulate without assistance. Neurologic:  Normal speech and language. No gross focal neurologic deficits are appreciated. No gait instability. Skin:  Skin is warm, dry and intact.  As noted above. Psychiatric: Mood and affect are normal. Speech and behavior are  normal.  ____________________________________________   LABS (all labs ordered are listed, but only abnormal results are displayed)  Labs Reviewed - No data to display ____________________________________________ RADIOLOGY   Official radiology report(s): US Venous Img Lower Unilateral Right  Result Date: 12/21/2019 CLINICAL DATA:  Right thigh pain EXAM: RIGHT LOWER EXTREMITY VENOUS DOPPLER ULTRASOUND TECHNIQUE: Gray-scale sonography with graded compression, as well as color Doppler and duplex ultrasound were performed to evaluate the lower extremity deep venous systems from the level of the common femoral vein and including the common femoral, femoral, profunda femoral, popliteal and calf veins including the posterior tibial, peroneal and gastrocnemius veins when visible. The superficial great saphenous vein  was also interrogated. Spectral Doppler was utilized to evaluate flow at rest and with distal augmentation maneuvers in the common femoral, femoral and popliteal veins. COMPARISON:  None. FINDINGS: Contralateral Common Femoral Vein: Respiratory phasicity is normal and symmetric with the symptomatic side. No evidence of thrombus. Normal compressibility. Common Femoral Vein: No evidence of thrombus. Normal compressibility, respiratory phasicity and response to augmentation. Saphenofemoral Junction: No evidence of thrombus. Normal compressibility and flow on color Doppler imaging. Profunda Femoral Vein: No evidence of thrombus. Normal compressibility and flow on color Doppler imaging. Femoral Vein: No evidence of thrombus. Normal compressibility, respiratory phasicity and response to augmentation. Popliteal Vein: No evidence of thrombus. Normal compressibility, respiratory phasicity and response to augmentation. Calf Veins: No evidence of thrombus. Normal compressibility and flow on color Doppler imaging. IMPRESSION: No evidence of deep venous thrombosis. Electronically Signed   By: Jerilynn Mages.  Shick M.D.    On: 12/21/2019 10:28   DG Femur Min 2 Views Right  Result Date: 12/21/2019 CLINICAL DATA:  Right thigh pain.  No injury. EXAM: RIGHT FEMUR 2 VIEWS COMPARISON:  None. FINDINGS: There is no evidence of fracture or other focal bone lesions. Soft tissues are unremarkable. IMPRESSION: Negative. Electronically Signed   By: Titus Dubin M.D.   On: 12/21/2019 11:04    ____________________________________________   PROCEDURES  Procedure(s) performed (including Critical Care):  Procedures   ____________________________________________   INITIAL IMPRESSION / ASSESSMENT AND PLAN / ED COURSE  As part of my medical decision making, I reviewed the following data within the electronic MEDICAL RECORD NUMBER Notes from prior ED visits and [place] Controlled Substance Database  74 year old male presents to the ED with complaint of right thigh pain while eating dinner last evening.  He denies any injury to his leg.  He denies any previous DVT and states that the reason he is on Xarelto is because of A. fib.  Patient denies any redness, traveling for long distances or direct trauma to his leg.  Ultrasound was reassuring and reported as no DVT present.  Also x-ray was negative and patient was reassured.  At this time there is no skin disruption but shingles is also in the differential will follow up with his PCP if any continued problems.  He will return to the emergency department if any severe worsening of his symptoms.  Patient was discharged with a prescription for hydrocodone/acetaminophen to take as needed for pain.  ____________________________________________   FINAL CLINICAL IMPRESSION(S) / ED DIAGNOSES  Final diagnoses:  Right leg pain     ED Discharge Orders         Ordered    HYDROcodone-acetaminophen (NORCO/VICODIN) 5-325 MG tablet  Every 4 hours PRN     12/21/19 1124           Note:  This document was prepared using Dragon voice recognition software and may include unintentional  dictation errors.    Johnn Hai, PA-C 12/21/19 1414    Nance Pear, MD 12/21/19 1435

## 2019-12-21 NOTE — Telephone Encounter (Signed)
Jared Whitaker: [id number] (exp. 12/21/19 to 01/20/20) order sent to GI. They will reach out to the patient to schedule.

## 2019-12-21 NOTE — ED Notes (Signed)
Pt verbalized understanding of discharge instructions. NAD at this time. 

## 2020-01-06 ENCOUNTER — Ambulatory Visit (HOSPITAL_COMMUNITY): Payer: Medicare HMO | Attending: Cardiology

## 2020-01-06 ENCOUNTER — Other Ambulatory Visit: Payer: Self-pay

## 2020-01-06 DIAGNOSIS — I4891 Unspecified atrial fibrillation: Secondary | ICD-10-CM | POA: Insufficient documentation

## 2020-01-06 DIAGNOSIS — I351 Nonrheumatic aortic (valve) insufficiency: Secondary | ICD-10-CM | POA: Insufficient documentation

## 2020-01-06 DIAGNOSIS — E785 Hyperlipidemia, unspecified: Secondary | ICD-10-CM | POA: Insufficient documentation

## 2020-01-06 DIAGNOSIS — I10 Essential (primary) hypertension: Secondary | ICD-10-CM | POA: Diagnosis not present

## 2020-01-06 DIAGNOSIS — I77810 Thoracic aortic ectasia: Secondary | ICD-10-CM | POA: Diagnosis not present

## 2020-01-07 ENCOUNTER — Telehealth: Payer: Self-pay

## 2020-01-07 DIAGNOSIS — I77810 Thoracic aortic ectasia: Secondary | ICD-10-CM

## 2020-01-07 NOTE — Telephone Encounter (Signed)
-----   Message from Sueanne Margarita, MD sent at 01/07/2020 11:29 AM EST ----- 2D echo images personally reviewed by me and heart function appears normal with EF 60-65% and not 50-55%. There is mild to moderately enlarged LA and RA and mild thickened of AV.  The ascending aorta is mildly enlarged.  Repeat limited echo in 1 year for dilated aorta

## 2020-01-07 NOTE — Telephone Encounter (Signed)
The patient has been notified of the result and verbalized understanding.  All questions (if any) were answered. Antonieta Iba, RN 01/07/2020 3:39 PM   Patient asked if he needed to make an appointment to see Dr. [name]. He is overdue for his 1 year FU. Appointment scheduled for 03/29. Echocardiogram order placed for 1 year.

## 2020-01-11 ENCOUNTER — Ambulatory Visit (INDEPENDENT_AMBULATORY_CARE_PROVIDER_SITE_OTHER): Payer: Medicare HMO | Admitting: Neurology

## 2020-01-11 DIAGNOSIS — R519 Headache, unspecified: Secondary | ICD-10-CM

## 2020-01-11 DIAGNOSIS — G4733 Obstructive sleep apnea (adult) (pediatric): Secondary | ICD-10-CM

## 2020-01-11 DIAGNOSIS — I482 Chronic atrial fibrillation, unspecified: Secondary | ICD-10-CM

## 2020-01-11 DIAGNOSIS — I6381 Other cerebral infarction due to occlusion or stenosis of small artery: Secondary | ICD-10-CM

## 2020-01-11 DIAGNOSIS — R351 Nocturia: Secondary | ICD-10-CM

## 2020-01-11 DIAGNOSIS — H9313 Tinnitus, bilateral: Secondary | ICD-10-CM

## 2020-01-14 ENCOUNTER — Ambulatory Visit
Admission: RE | Admit: 2020-01-14 | Discharge: 2020-01-14 | Disposition: A | Discharge status: Home / Self Care | Payer: Medicare HMO | Source: Ambulatory Visit | Attending: Neurology | Admitting: Neurology

## 2020-01-14 DIAGNOSIS — I6381 Other cerebral infarction due to occlusion or stenosis of small artery: Secondary | ICD-10-CM

## 2020-01-14 DIAGNOSIS — H9313 Tinnitus, bilateral: Secondary | ICD-10-CM | POA: Diagnosis not present

## 2020-01-14 DIAGNOSIS — R519 Headache, unspecified: Secondary | ICD-10-CM | POA: Diagnosis not present

## 2020-01-14 DIAGNOSIS — I482 Chronic atrial fibrillation, unspecified: Secondary | ICD-10-CM

## 2020-01-14 DIAGNOSIS — R351 Nocturia: Secondary | ICD-10-CM

## 2020-01-14 MED ORDER — GADOBENATE DIMEGLUMINE 529 MG/ML IV SOLN
19.0000 mL | Freq: Once | INTRAVENOUS | Status: AC | PRN
  Administered 2020-01-14: 19 mL via INTRAVENOUS

## 2020-01-15 NOTE — Addendum Note (Signed)
Addended by: Star Age on: 01/15/2020 11:27 AM   Modules accepted: Orders

## 2020-01-15 NOTE — Procedures (Signed)
Patient Information     First Name: Jared Last Name: Ivey Whitaker. ID: [id number]  Birth Date: 03/16/1946 Age: 74 Gender: Male  Referring Provider: [name], MD BMI: 31.4 (W=207 lb, H=5' 8'')  Neck Circ.:  18 '' Epworth:  6/24   Sleep Study Information    Study Date: Jan 12, 2020 S/H/A Version: 001.001.001.001 / 4.0.1515 / 34  History:    74 year old man with a history of A. fib, osteoarthritis, hypertension, hyperlipidemia, anemia, allergic rhinitis, BPH, vitamin B12 deficiency, anxiety, and obesity, who reports recurrent headaches for the past few months.  Summary & Diagnosis:     Mild OSA Recommendations:     This home sleep test demonstrates overall mild obstructive sleep apnea with a total AHI of 7.3/hour and O2 nadir of 91%. Given the patient's medical history and sleep related complaints, treatment with positive airway pressure is recommended. This can be achieved in the form of autoPAP trial/titration at home. A full night CPAP titration study will help with proper treatment settings and mask fitting if needed. Alternative treatments include weight loss along with avoidance of the supine sleep position, or an oral appliance in appropriate candidates.   Please note that untreated obstructive sleep apnea may carry additional perioperative morbidity. Patients with significant obstructive sleep apnea should receive perioperative PAP therapy and the surgeons and particularly the anesthesiologist should be informed of the diagnosis and the severity of the sleep disordered breathing. The patient should be cautioned not to drive, work at heights, or operate dangerous or heavy equipment when tired or sleepy. Review and reiteration of good sleep hygiene measures should be pursued with any patient. Other causes of the patient's symptoms, including circadian rhythm disturbances, an underlying mood disorder, medication effect and/or an underlying medical problem cannot be ruled out based on this test.  Clinical correlation is recommended.   The patient and his referring provider will be notified of the test results. The patient will be seen in follow up in sleep clinic at [facility].  I certify that I have reviewed the raw data recording prior to the issuance of this report in accordance with the standards of the American Academy of Sleep Medicine (AASM).  Star Age, MD, PhD [facility]) Diplomat, ABPN (Neurology and Sleep)              Sleep Summary  Oxygen Saturation Statistics   Start Study Time: End Study Time: Total Recording Time:  1:38:46 AM 8:53:21 AM 7 h, 14 min  Total Sleep Time % REM of Sleep Time:  6 h, 1 min  11.7    Mean: 96 Minimum: 91 Maximum: 100  Mean of Desaturations Nadirs (%):   94  Oxygen Desaturation. %: 4-9 10-20 >20 Total  Events Number Total  7 100.0  0 0.0  0 0.0  7 100.0  Oxygen Saturation: <90 <=88 <85 <80 <70  Duration (minutes): Sleep % 0.0 0.0 0.0 0.0 0.0 0.0 0.0 0.0 0.0 0.0     Respiratory Indices      Total Events REM NREM All Night  pRDI:  184  pAHI:  38 ODI:  7  pAHIc:  4  % CSR: 0.0 47.2 11.3 3.8 0.0 34.1 6.9 1.1 1.0 35.4 7.3 1.4 0.9       Pulse Rate Statistics during Sleep (BPM)      Mean: 46 Minimum: N/A Maximum:  85    Indices are calculated using technically valid sleep time of 5 h, 11 min.  Central-Indices are calculated using technically valid sleep time of  4  h, 42 min. pRDI/pAHI are calculated using oxi desaturations ? 3%  Body Position Statistics  Position Supine Prone Right Left Non-Supine  Sleep (min) 2.0 0.0 226.6 132.5 359.1  Sleep % 0.6 0.0 62.7 36.7 99.4  pRDI N/A N/A 37.5 32.3 35.3  pAHI N/A N/A 9.5 4.2 7.3  ODI N/A N/A 1.6 0.9 1.4     Snoring Statistics Snoring Level (dB) >40 >50 >60 >70 >80 >Threshold (45)  Sleep (min) 41.7 4.9 1.7 0.0 0.0 11.2  Sleep % 11.5 1.4 0.5 0.0 0.0 3.1    Mean: 41 dB Sleep Stages Chart

## 2020-01-15 NOTE — Progress Notes (Signed)
Patient referred by Dr. Rex Kras, seen by me on 12/08/19, HST on 01/12/20.    Please call and notify the patient that the recent home sleep test showed obstructive sleep apnea. OSA is overall mild, but worth treating to see if he feels better after treatment, particularly, with regards to his recurrent HAs. To that end, I recommend treatment for this in the form of autoPAP, which means, that we don't have to bring him in for a sleep study with CPAP, but will let him try an autoPAP machine at home, through a DME company (of his choice, or as per insurance requirement). The DME representative will educate him on how to use the machine, how to put the mask on, etc. I have placed an order in the chart. Please send referral, talk to patient, send report to referring MD. We will need a FU in sleep clinic for 10 weeks post-PAP set up, please arrange that with me or one of our NPs. Thanks,   Star Age, MD, PhD [facility])

## 2020-01-15 NOTE — Progress Notes (Signed)
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  He has some evidence of sinus disease, if he has chronic sinus issues including congestion/drainage, he may benefit from seeing an ENT specialist. I doubt, that the sinus issues seen on the MRI would explain his headaches.  He can talk to his PCP about a referral.

## 2020-01-19 ENCOUNTER — Telehealth: Payer: Self-pay

## 2020-01-19 NOTE — Telephone Encounter (Signed)
Error

## 2020-01-19 NOTE — Telephone Encounter (Signed)
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  He has some evidence of sinus disease, if he has chronic sinus issues including congestion/drainage, he may benefit from seeing an ENT specialist. I doubt, that the sinus issues seen on the MRI would explain his headaches.  He can talk to his PCP about a referral.

## 2020-01-19 NOTE — Telephone Encounter (Signed)
I reached out to the pt and advised of results. Pt verbalized understanding and had no further questions at this time.

## 2020-01-19 NOTE — Telephone Encounter (Signed)
I contacted the pt on result. We were able to discuss and at this point pt would like to hold off on Autopap therapy. He reports he would like a few days to think this over and then will get back with [place].  Pt was advised we would wait to hear from him.

## 2020-01-19 NOTE — Telephone Encounter (Signed)
-----   Message from Star Age, MD sent at 01/15/2020 11:27 AM EST ----- Patient referred by Dr. Rex Kras, seen by me on 12/08/19, HST on 01/12/20.    Please call and notify the patient that the recent home sleep test showed obstructive sleep apnea. OSA is overall mild, but worth treating to see if he feels better after treatment, particularly, with regards to his recurrent HAs. To that end, I recommend treatment for this in the form of autoPAP, which means, that we don't have to bring him in for a sleep study with CPAP, but will let him try an autoPAP machine at home, through a DME company (of his choice, or as per insurance requirement). The DME representative will educate him on how to use the machine, how to put the mask on, etc. I have placed an order in the chart. Please send referral, talk to patient, send report to referring MD. We will need a FU in sleep clinic for 10 weeks post-PAP set up, please arrange that with me or one of our NPs. Thanks,   Star Age, MD, PhD [facility])

## 2020-02-01 DIAGNOSIS — H524 Presbyopia: Secondary | ICD-10-CM | POA: Diagnosis not present

## 2020-02-01 DIAGNOSIS — Z961 Presence of intraocular lens: Secondary | ICD-10-CM | POA: Diagnosis not present

## 2020-02-08 ENCOUNTER — Ambulatory Visit: Payer: Medicare HMO | Admitting: Cardiology

## 2020-02-09 ENCOUNTER — Ambulatory Visit: Payer: Medicare HMO | Admitting: Cardiology

## 2020-02-09 ENCOUNTER — Encounter: Payer: Self-pay | Admitting: Cardiology

## 2020-02-09 ENCOUNTER — Other Ambulatory Visit: Payer: Self-pay

## 2020-02-09 VITALS — BP 132/60 | HR 50 | Ht 67.0 in | Wt 215.4 lb

## 2020-02-09 DIAGNOSIS — I77810 Thoracic aortic ectasia: Secondary | ICD-10-CM | POA: Diagnosis not present

## 2020-02-09 DIAGNOSIS — R600 Localized edema: Secondary | ICD-10-CM | POA: Diagnosis not present

## 2020-02-09 DIAGNOSIS — R030 Elevated blood-pressure reading, without diagnosis of hypertension: Secondary | ICD-10-CM

## 2020-02-09 DIAGNOSIS — I482 Chronic atrial fibrillation, unspecified: Secondary | ICD-10-CM

## 2020-02-09 MED ORDER — RIVAROXABAN 20 MG PO TABS: 20.0000 mg | ORAL_TABLET | Freq: Every day | ORAL | 0 refills | Status: DC

## 2020-02-09 MED ORDER — FUROSEMIDE 20 MG PO TABS: 20.0000 mg | ORAL_TABLET | Freq: Every day | ORAL | 11 refills | Status: DC | PRN

## 2020-02-09 NOTE — Progress Notes (Signed)
Cardiology Office Note:    Date:  02/09/2020   ID:  Jared Whitaker., DOB 01-22-1946, MRN [id number]  PCP:  [name], MD  Cardiologist:  [name], MD    Referring MD: [name], MD   Chief Complaint  Patient presents with  . Atrial Fibrillation    History of Present Illness:    Jared Whitaker. is a 74 y.o. male with a hx of chronic atrial fibrillation on Xarelto.  He has failed flecainide in the past. 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 He declined RFA orAAD. He has chronic right bundle branch block, asthma and dilated ascending aorta and aortic root.Nuclear stress test low risk with normal perfusion LVEF 57% in 2017. Last 2D echo 07/2017 normal LV EF 55-60% with mild LVH and mildly dilated asending aorta 40 mm and aortic root 37 mm.  He is here today for followup and is doing well.  He denies any chest pain or pressure, SOB, DOE, PND, orthopnea,  dizziness, palpitations or syncope. He occasionally has some mild LE edema. He is compliant with his meds and is tolerating meds with no SE.    Past Medical History:  Diagnosis Date  . Anemia   . Anemia   . Colon polyp    Tubular adenoma- in colonoscopy in 07/2006 followed by GI Dr [name]  . Contact dermatitis   . Dilated aortic root ([place])    ascending aorta 36mm by echo 07/2017  . Hyperlipidemia    pateint denies this  . Hypertension    patient denies this  . Normal cardiac stress test   . Osteoarthritis    of the shoulders  . Overweight   . Persistent atrial fibrillation (HCC)    chads2vasc score is 1-2 (pt denies HTN)  . Shortness of breath   . Snoring    w/u for apnea is in progress    Past Surgical History:  Procedure Laterality Date  . CARDIOVERSION N/A 03/03/2014   Procedure: CARDIOVERSION;  Surgeon: Carlena Bjornstad, MD;  Location[facility] ENDOSCOPY;  Service: Cardiovascular;  Laterality: N/A;  . CARDIOVERSION N/A 01/31/2015   Procedure: CARDIOVERSION;  Surgeon: Carlena Bjornstad, MD;  Location: [place];  Service: Cardiovascular;  Laterality: N/A;  . COLONOSCOPY    . left leg fracture  1969    Current Medications: Current Meds  Medication Sig  . Cyanocobalamin (VITAMIN B 12 PO) Take by mouth. Daily  . fluticasone furoate-vilanterol (BREO ELLIPTA) 100-25 MCG/INH AEPB Inhale 1 puff into the lungs daily.  . rivaroxaban (XARELTO) 20 MG TABS tablet Take 1 tablet (20 mg total) by mouth daily with supper. Pt is overdue for follow-up with Dr [name]. MUST SEE MD for future refills.  . rosuvastatin (CRESTOR) 5 MG tablet Take 5 mg by mouth daily.  . [DISCONTINUED] rivaroxaban (XARELTO) 20 MG TABS tablet Take 1 tablet (20 mg total) by mouth daily with supper. Pt is overdue for follow-up with Dr [name]. MUST SEE MD for future refills.     Allergies:   Penicillins   Social History   Socioeconomic History  . Marital status: Single    Spouse name: Not on file  . Number of children: Not on file  . Years of education: Not on file  . Highest education level: Not on file  Occupational History  . Not on file  Tobacco Use  . Smoking status: Never Smoker  . Smokeless tobacco: Never Used  .  Tobacco comment: Secondhand smoke exposure  Substance and Sexual Activity  . Alcohol use: Yes    Alcohol/week: 1.0 - 2.0 standard drinks    Types: 1 - 2 Cans of beer per week    Comment: 3 six packs a week  . Drug use: No  . Sexual activity: Not on file  Other Topics Concern  . Not on file  Social History Narrative   Lives in [place].  Works an a Nurse, adult.   Social Determinants of Health   Financial Resource Strain:   . Difficulty of Paying Living Expenses:   Food Insecurity:   . Worried About Charity fundraiser in the Last Year:   . Arboriculturist in the Last Year:   Transportation Needs:   . Film/video editor (Medical):   [name] Lack of Transportation (Non-Medical):   Physical Activity:   . Days  of Exercise per Week:   . Minutes of Exercise per Session:   Stress:   . Feeling of Stress :   Social Connections:   . Frequency of Communication with Friends and Family:   . Frequency of Social Gatherings with Friends and Family:   . Attends Religious Services:   . Active Member of Clubs or Organizations:   . Attends Archivist Meetings:   [name] Marital Status:      Family History: The patient's family history includes Bladder Cancer in his father; CVA in his mother; Cancer in his paternal uncle; Heart attack in his father and mother.  ROS:   Please see the history of present illness.    ROS  All other systems reviewed and negative.   EKGs/Labs/Other Studies Reviewed:    The following studies were reviewed today: EKG  EKG:  EKG is  ordered today.  The ekg ordered today demonstrates atrial fibrillation with SVR  Recent Labs: 12/08/2019: ALT 17; BUN 12; Creatinine, Ser 1.12; Potassium 4.5; Sodium 139   Recent Lipid Panel No results found for: CHOL, TRIG, HDL, CHOLHDL, VLDL, LDLCALC, LDLDIRECT  Physical Exam:    VS:  BP 132/60   Pulse (!) 50   Ht 5\' 7"  (1.702 m)   Wt 215 lb 6.4 oz (97.7 kg)   BMI 33.74 kg/m     Wt Readings from Last 3 Encounters:  02/09/20 215 lb 6.4 oz (97.7 kg)  12/21/19 206 lb (93.4 kg)  12/08/19 206 lb (93.4 kg)     GEN:  Well nourished, well developed in no acute distress HEENT: Normal NECK: No JVD; No carotid bruits LYMPHATICS: No lymphadenopathy CARDIAC: RRR, no murmurs, rubs, gallops RESPIRATORY:  Clear to auscultation without rales, wheezing or rhonchi  ABDOMEN: Soft, non-tender, non-distended MUSCULOSKELETAL:  1+ LE edema; No deformity  SKIN: Warm and dry NEUROLOGIC:  Alert and oriented x 3 PSYCHIATRIC:  Normal affect   ASSESSMENT:    1. Chronic atrial fibrillation ([place])   2. Borderline systolic HTN   3. Ascending aorta dilation (HCC)   4. Morbid obesity ([name])   5. Leg edema    PLAN:    In order of problems listed  above:  1.  Chronic atrial fibrillation  -HR is well controlled on exam today -on anticoagulation for CHDAS2VASC score of 2.  -denies any bleeding problems.  -Hbg 12.9 and Creatinine 1.12 in Jan 2021 -continue   2.  HTN  - BP is controlled on exam today.   -He is currently not on antihypertensive medication.    3.  Ascending aortic dilatation  -  echo 07/2020 showed normal LVF with mildAI and mildly dilated ascending aorta/aortic root at 60mm.  - I will repeat echo 12/2020 - BP well controlled Xarelto 20mg  daily - continue statin  4.  Obesity - I have encouraged him to get into a routine exercise program and cut back on carbs and portions.   5.  Chronic LE edema -I will start him on Lasix 20mg  daily PRN for LE edema -encouraged him to follow low Na diet   Medication Adjustments/Labs and Tests Ordered: Current medicines are reviewed at length with the patient today.  Concerns regarding medicines are outlined above.  Orders Placed This Encounter  Procedures  . EKG 12-Lead   Meds ordered this encounter  Medications  . rivaroxaban (XARELTO) 20 MG TABS tablet    Sig: Take 1 tablet (20 mg total) by mouth daily with supper. Pt is overdue for follow-up with Dr [name]. MUST SEE MD for future refills.    Dispense:  90 tablet    Refill:  0  . furosemide (LASIX) 20 MG tablet    Sig: Take 1 tablet (20 mg total) by mouth daily as needed for edema.    Dispense:  30 tablet    Refill:  11    Signed, [name], MD  02/09/2020 1:28 PM    [place]

## 2020-02-09 NOTE — Patient Instructions (Addendum)
Medication Instructions:  Your physician has recommended you make the following change in your medication:  1-Take Furosemide 20 mg by mouth daily as needed for swelling.  *If you need a refill on your cardiac medications before your next appointment, please call your pharmacy*  Lab Work: If you have labs (blood work) drawn today and your tests are completely normal, you will receive your results only by: [name] MyChart Message (if you have MyChart) OR . A paper copy in the mail If you have any lab test that is abnormal or we need to change your treatment, we will call you to review the results.  Testing/Procedures: None ordered at this time.  Follow-Up: At [facility], you and your health needs are our priority.  As part of our continuing mission to provide you with exceptional heart care, we have created designated Provider Care Teams.  These Care Teams include your primary Cardiologist (physician) and Advanced Practice Providers (APPs -  Physician Assistants and Nurse Practitioners) who all work together to provide you with the care you need, when you need it.  We recommend signing up for the patient portal called "MyChart".  Sign up information is provided on this After Visit Summary.  MyChart is used to connect with patients for Virtual Visits (Telemedicine).  Patients are able to view lab/test results, encounter notes, upcoming appointments, etc.  Non-urgent messages can be sent to your provider as well.   To learn more about what you can do with MyChart, go to [email or web address].    Your next appointment:   1 year(s)  The format for your next appointment:   Either In Person or Virtual  Provider:   You may see [name], MD or one of the following Advanced Practice Providers on your designated Care Team:    Melina Copa, PA-C  Ermalinda Barrios, PA-C

## 2020-03-02 DIAGNOSIS — I6529 Occlusion and stenosis of unspecified carotid artery: Secondary | ICD-10-CM | POA: Diagnosis not present

## 2020-03-02 DIAGNOSIS — E782 Mixed hyperlipidemia: Secondary | ICD-10-CM | POA: Diagnosis not present

## 2020-03-02 DIAGNOSIS — Z8673 Personal history of transient ischemic attack (TIA), and cerebral infarction without residual deficits: Secondary | ICD-10-CM | POA: Diagnosis not present

## 2020-03-02 DIAGNOSIS — Z79899 Other long term (current) drug therapy: Secondary | ICD-10-CM | POA: Diagnosis not present

## 2020-04-07 ENCOUNTER — Other Ambulatory Visit: Payer: Self-pay | Admitting: *Deleted

## 2020-04-07 DIAGNOSIS — R0989 Other specified symptoms and signs involving the circulatory and respiratory systems: Secondary | ICD-10-CM

## 2020-04-08 ENCOUNTER — Telehealth (HOSPITAL_COMMUNITY): Payer: Self-pay

## 2020-04-08 NOTE — Telephone Encounter (Signed)

## 2020-04-12 ENCOUNTER — Encounter: Payer: Self-pay | Admitting: Vascular Surgery

## 2020-04-12 ENCOUNTER — Ambulatory Visit: Payer: Medicare HMO | Admitting: Vascular Surgery

## 2020-04-12 ENCOUNTER — Ambulatory Visit (HOSPITAL_COMMUNITY)
Admission: RE | Admit: 2020-04-12 | Discharge: 2020-04-12 | Disposition: A | Discharge status: Home / Self Care | Payer: Medicare HMO | Source: Ambulatory Visit | Attending: Vascular Surgery | Admitting: Vascular Surgery

## 2020-04-12 ENCOUNTER — Other Ambulatory Visit: Payer: Self-pay

## 2020-04-12 VITALS — BP 154/74 | HR 48 | Temp 98.0°F | Resp 20 | Ht 67.0 in | Wt 211.0 lb

## 2020-04-12 DIAGNOSIS — R0989 Other specified symptoms and signs involving the circulatory and respiratory systems: Secondary | ICD-10-CM

## 2020-04-12 DIAGNOSIS — I6529 Occlusion and stenosis of unspecified carotid artery: Secondary | ICD-10-CM

## 2020-04-12 NOTE — Progress Notes (Signed)
[facility] [place]  Patient name: Jared Whitaker. MRN: [id number] DOB: [birth year] Sex: male  REASON FOR CONSULT: Evaluation carotid artery stenosis  HPI: Jared Whitaker. is a 74 y.o. male, who is here today for discussion of duplex finding of carotid artery stenosis.  He has a history of chronic atrial fibrillation.  He underwent evaluation for some neurologic issues related to headache.  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  He is seen today for further discussion of this.  He denies any prior focal neurologic deficit.  Specifically no amaurosis fugax, a aphasia or TIA or stroke.  He does report that recently does have a tingling sensation in his right hand which resolved spontaneously.  Past Medical History:  Diagnosis Date  . Anemia   . Anemia   . Colon polyp    Tubular adenoma- in colonoscopy in 07/2006 followed by GI Dr [name]  . Contact dermatitis   . Dilated aortic root ([place])    ascending aorta 21mm by echo 07/2017  . Hyperlipidemia    pateint denies this  . Hypertension    patient denies this  . Normal cardiac stress test   . Osteoarthritis    of the shoulders  . Overweight   . Persistent atrial fibrillation (HCC)    chads2vasc score is 1-2 (pt denies HTN)  . Shortness of breath   . Snoring    w/u for apnea is in progress  . Stroke [facility])     Family History  Problem Relation Age of Onset  . CVA Mother   . Heart attack Mother   . Heart attack Father        smoker  . Bladder Cancer Father   . Cancer Paternal Uncle     SOCIAL HISTORY: Social History   Socioeconomic History  . Marital status: Single    Spouse name: Not on file  . Number of children: Not on file  . Years of education:  Not on file  . Highest education level: Not on file  Occupational History  . Not on file  Tobacco Use  . Smoking status: Never Smoker  . Smokeless tobacco: Never Used  . Tobacco comment: Secondhand smoke exposure  Substance and Sexual Activity  . Alcohol use: Yes    Alcohol/week: 1.0 - 2.0 standard drinks    Types: 1 - 2 Cans of beer per week    Comment: 3 six packs a week  . Drug use: No  . Sexual activity: Not on file  Other Topics Concern  . 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   Social Determinants of Health   Financial Resource Strain:   . Difficulty of Paying Living Expenses:   Food Insecurity:   . Worried About Charity fundraiser in the Last Year:   . Arboriculturist in the Last Year:   Transportation Needs:   . Film/video editor (Medical):   [name] Lack of Transportation (Non-Medical):   Physical Activity:   . Days of Exercise per Week:   . Minutes of Exercise per Session:   Stress:   . Feeling of Stress :   Social Connections:   . Frequency of Communication with Friends and  Family:   . Frequency of Social Gatherings with Friends and Family:   . Attends Religious Services:   . Active Member of Clubs or Organizations:   . Attends Archivist Meetings:   [name] Marital Status:   Intimate Partner Violence:   . Fear of Current or Ex-Partner:   . Emotionally Abused:   [name] Physically Abused:   . Sexually Abused:     Allergies  Allergen Reactions  . Penicillins Rash    Purple spots, swelling    Current Outpatient Medications  Medication Sig Dispense Refill  . Cyanocobalamin (VITAMIN B 12 PO) Take by mouth. Daily    . fluticasone furoate-vilanterol (BREO ELLIPTA) 100-25 MCG/INH AEPB Inhale 1 puff into the lungs daily. 180 each 0  . furosemide (LASIX) 20 MG tablet Take 1 tablet (20 mg total) by mouth daily as needed for edema. 30 tablet 11  . rivaroxaban (XARELTO) 20 MG TABS tablet Take 1  tablet (20 mg total) by mouth daily with supper. Pt is overdue for follow-up with Dr [name]. MUST SEE MD for future refills. 90 tablet 0  . rosuvastatin (CRESTOR) 5 MG tablet Take 5 mg by mouth daily.     No current facility-administered medications for this visit.    REVIEW OF SYSTEMS:  [X]  denotes positive finding, [ ]  denotes negative finding Cardiac  Comments:  Chest pain or chest pressure:    Shortness of breath upon exertion:    Short of breath when lying flat:    Irregular heart rhythm: x       Vascular    Pain in calf, thigh, or hip brought on by ambulation:    Pain in feet at night that wakes you up from your sleep:     Blood clot in your veins:    Leg swelling:  x       Pulmonary    Oxygen at home:    Productive cough:     Wheezing:         Neurologic    Sudden weakness in arms or legs:     Sudden numbness in arms or legs:  x hand  Sudden onset of difficulty speaking or slurred speech:    Temporary loss of vision in one eye:     Problems with dizziness:         Gastrointestinal    Blood in stool:     Vomited blood:         Genitourinary    Burning when urinating:     Blood in urine:        Psychiatric    Major depression:         Hematologic    Bleeding problems:    Problems with blood clotting too easily:        Skin    Rashes or ulcers:        Constitutional    Fever or chills:      PHYSICAL EXAM: Vitals:   04/12/20 1355 04/12/20 1357  BP: (!) 153/75 (!) 154/74  Pulse: (!) 48   Resp: 20   Temp: 98 F (36.7 C)   SpO2: 98%   Weight: 211 lb (95.7 kg)   Height: 5\' 7"  (1.702 m)     GENERAL: The patient is a well-nourished male, in no acute distress. The vital signs are documented above. CARDIOVASCULAR: 2+ radial pulses bilaterally.  2+ dorsalis pedis pulses bilaterally.  Rotted arteries are without bruits bilaterally. PULMONARY: There is good air exchange  ABDOMEN:  Soft and non-tender  MUSCULOSKELETAL: There are no major deformities or  cyanosis. NEUROLOGIC: No focal weakness or paresthesias are detected. SKIN: There are no ulcers or rashes noted. PSYCHIATRIC: The patient has a normal affect.  DATA:  He underwent repeat duplex in our office today.  This reveals 50 to 79% right carotid stenosis which is in the lower end of this range.  Left without the significant stenosis.  MEDICAL ISSUES: I had a long discussion with the patient regarding this.  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  Explained that this would be extremely unlikely that this was from a right carotid source.  He has had no prior symptoms of carotid disease.  I have recommended follow-up in 6 months with repeat carotid duplex to rule out any progression of his stenosis.  I did discuss symptoms of right carotid disease and he will notify us immediately should this occur or present to the emergency department should he have a severe event.  He is unable to take antiplatelets and is on Xarelto   Rosetta Posner, MD [facility] Tel [phone] Pager [phone]

## 2020-04-14 ENCOUNTER — Other Ambulatory Visit: Payer: Self-pay | Admitting: *Deleted

## 2020-04-14 DIAGNOSIS — I6529 Occlusion and stenosis of unspecified carotid artery: Secondary | ICD-10-CM

## 2020-06-01 DIAGNOSIS — H903 Sensorineural hearing loss, bilateral: Secondary | ICD-10-CM | POA: Diagnosis not present

## 2020-06-20 DIAGNOSIS — H903 Sensorineural hearing loss, bilateral: Secondary | ICD-10-CM | POA: Diagnosis not present

## 2020-07-08 ENCOUNTER — Other Ambulatory Visit: Payer: Self-pay | Admitting: *Deleted

## 2020-07-08 MED ORDER — RIVAROXABAN 20 MG PO TABS: 20.0000 mg | ORAL_TABLET | Freq: Every day | ORAL | 1 refills | Status: DC

## 2020-07-08 NOTE — Telephone Encounter (Signed)
Prescription refill request for Xarelto received.   Last office visit: [place], 02/09/2020 Weight: 95.7 kg Age: 74 y.o. Scr: 1.12, 12/08/2019 CrCl: 68ml/min   Prescription refill sent.

## 2020-07-08 NOTE — Addendum Note (Signed)
Addended by: Lutricia Feil on: 07/08/2020 04:34 PM   Modules accepted: Orders

## 2020-07-19 ENCOUNTER — Other Ambulatory Visit: Payer: Self-pay | Admitting: *Deleted

## 2020-07-19 MED ORDER — RIVAROXABAN 20 MG PO TABS: 20.0000 mg | ORAL_TABLET | Freq: Every day | ORAL | 1 refills | Status: DC

## 2020-07-19 NOTE — Telephone Encounter (Signed)
Xarelto 20mg  paper refill request received. Pt is 74 years old, weight-95.7kg, Crea-1.12 on 12/08/2019, last seen by Dr. [name] on 02/09/2020, Diagnosis-Afib, CrCl-75.63ml/min; Dose is appropriate based on dosing criteria. Will send in refill to requested pharmacy.

## 2020-08-17 DIAGNOSIS — I6529 Occlusion and stenosis of unspecified carotid artery: Secondary | ICD-10-CM | POA: Diagnosis not present

## 2020-08-17 DIAGNOSIS — E782 Mixed hyperlipidemia: Secondary | ICD-10-CM | POA: Diagnosis not present

## 2020-08-17 DIAGNOSIS — Z23 Encounter for immunization: Secondary | ICD-10-CM | POA: Diagnosis not present

## 2020-08-17 DIAGNOSIS — Z8673 Personal history of transient ischemic attack (TIA), and cerebral infarction without residual deficits: Secondary | ICD-10-CM | POA: Diagnosis not present

## 2020-08-17 DIAGNOSIS — J4540 Moderate persistent asthma, uncomplicated: Secondary | ICD-10-CM | POA: Diagnosis not present

## 2020-08-17 DIAGNOSIS — Z0000 Encounter for general adult medical examination without abnormal findings: Secondary | ICD-10-CM | POA: Diagnosis not present

## 2020-08-17 DIAGNOSIS — I4891 Unspecified atrial fibrillation: Secondary | ICD-10-CM | POA: Diagnosis not present

## 2020-08-17 DIAGNOSIS — E538 Deficiency of other specified B group vitamins: Secondary | ICD-10-CM | POA: Diagnosis not present

## 2020-08-17 DIAGNOSIS — R8290 Unspecified abnormal findings in urine: Secondary | ICD-10-CM | POA: Diagnosis not present

## 2020-08-17 DIAGNOSIS — N400 Enlarged prostate without lower urinary tract symptoms: Secondary | ICD-10-CM | POA: Diagnosis not present

## 2020-08-17 DIAGNOSIS — I77810 Thoracic aortic ectasia: Secondary | ICD-10-CM | POA: Diagnosis not present

## 2020-08-17 DIAGNOSIS — Z79899 Other long term (current) drug therapy: Secondary | ICD-10-CM | POA: Diagnosis not present

## 2020-11-08 ENCOUNTER — Other Ambulatory Visit: Payer: Self-pay | Admitting: *Deleted

## 2020-11-08 DIAGNOSIS — I6529 Occlusion and stenosis of unspecified carotid artery: Secondary | ICD-10-CM

## 2020-11-21 ENCOUNTER — Ambulatory Visit (HOSPITAL_COMMUNITY)
Admission: RE | Admit: 2020-11-21 | Discharge: 2020-11-21 | Disposition: A | Discharge status: Home / Self Care | Payer: Medicare HMO | Source: Ambulatory Visit | Attending: Physician Assistant | Admitting: Physician Assistant

## 2020-11-21 ENCOUNTER — Other Ambulatory Visit: Payer: Self-pay

## 2020-11-21 ENCOUNTER — Ambulatory Visit: Payer: Medicare HMO | Admitting: Physician Assistant

## 2020-11-21 VITALS — BP 142/68 | HR 48 | Temp 97.8°F | Resp 18 | Ht 67.5 in | Wt 212.0 lb

## 2020-11-21 DIAGNOSIS — I6529 Occlusion and stenosis of unspecified carotid artery: Secondary | ICD-10-CM | POA: Diagnosis not present

## 2020-11-21 NOTE — Progress Notes (Signed)
HISTORY AND PHYSICAL     CC:  follow up. Requesting Provider:  [name], MD  HPI: This is a 75 y.o. male here for follow up for carotid artery stenosis.    Pt was last seen 04/12/2020 by Dr. Donnetta Hutching and at that time he had undergone evaluation for some neurologic issues related to headache.  This revealed an incidental finding of an old stroke in the right internal capsule.  He subsequently underwent carotid duplex, which revealed moderate to severe right ICA stenosis and no significant left ICA stenosis.  Tennis Must denied any prior focal neurologic deficit specifically amaurosis fugax, aphasia or TIA.  He had some tingling sensation in the right hand which resolved spontaneously.  He is unable to take antiplatelets and is on Xarelto for chronic afib.    Pt returns today for follow up.   Pt denies any amaurosis fugax, speech difficulties, weakness, numbness, paralysis or clumsiness or facial droop.  He states that since he got his new lenses about 4 years ago, he has had some floaters.   He states he gets some numbness in the right arm occasionally while driving.   He served in [employer] and is retired from [employer].   The pt is on a statin for cholesterol management.  The pt is not on a daily aspirin.   Other AC:  Xarelto The pt is not on medication for hypertension.   The pt is not diabetic.   Tobacco hx:  never  Pt does not have family hx of AAA.  Past Medical History:  Diagnosis Date  . Anemia   . Anemia   . Colon polyp    Tubular adenoma- in colonoscopy in 07/2006 followed by GI Dr [name]  . Contact dermatitis   . Dilated aortic root ([place])    ascending aorta 53mm by echo 07/2017  . Hyperlipidemia    pateint denies this  . Hypertension    patient denies this  . Normal cardiac stress test   . Osteoarthritis    of the shoulders  . Overweight   . Persistent atrial fibrillation (HCC)    chads2vasc score is 1-2 (pt denies HTN)  . Shortness of breath   . Snoring     w/u for apnea is in progress  . Stroke [facility])     Past Surgical History:  Procedure Laterality Date  . CARDIOVERSION N/A 03/03/2014   Procedure: CARDIOVERSION;  Surgeon: Carlena Bjornstad, MD;  Location: [facility] ENDOSCOPY;  Service: Cardiovascular;  Laterality: N/A;  . CARDIOVERSION N/A 01/31/2015   Procedure: CARDIOVERSION;  Surgeon: Carlena Bjornstad, MD;  Location: [place];  Service: Cardiovascular;  Laterality: N/A;  . COLONOSCOPY    . left leg fracture  1969    Allergies  Allergen Reactions  . Penicillins Rash    Purple spots, swelling    Current Outpatient Medications  Medication Sig Dispense Refill  . Cyanocobalamin (VITAMIN B 12 PO) Take by mouth. Daily    . fluticasone furoate-vilanterol (BREO ELLIPTA) 100-25 MCG/INH AEPB Inhale 1 puff into the lungs daily. 180 each 0  . furosemide (LASIX) 20 MG tablet Take 1 tablet (20 mg total) by mouth daily as needed for edema. 30 tablet 11  . rivaroxaban (XARELTO) 20 MG TABS tablet Take 1 tablet (20 mg total) by mouth daily with supper. 90 tablet 1  . rosuvastatin (CRESTOR) 5 MG tablet Take 5 mg by mouth daily.     No current facility-administered medications for this visit.  Family History  Problem Relation Age of Onset  . CVA Mother   . Heart attack Mother   . Heart attack Father        smoker  . Bladder Cancer Father   . Cancer Paternal Uncle     Social History   Socioeconomic History  . Marital status: Single    Spouse name: Not on file  . Number of children: Not on file  . Years of education: Not on file  . Highest education level: Not on file  Occupational History  . Not on file  Tobacco Use  . Smoking status: Never Smoker  . Smokeless tobacco: Never Used  . Tobacco comment: Secondhand smoke exposure  Vaping Use  . Vaping Use: Never used  Substance and Sexual Activity  . Alcohol use: Yes    Alcohol/week: 1.0 - 2.0 standard drink    Types: 1 - 2 Cans of beer per week    Comment: 3 six packs a week  . Drug  use: No  . Sexual activity: Not on file  Other Topics Concern  . Not on file  Social History Narrative   Lives in [place].  Works an a Nurse, adult.   Social Determinants of Health   Financial Resource Strain: Not on file  Food Insecurity: Not on file  Transportation Needs: Not on file  Physical Activity: Not on file  Stress: Not on file  Social Connections: Not on file  Intimate Partner Violence: Not on file     REVIEW OF SYSTEMS:   [X]  denotes positive finding, [ ]  denotes negative finding Cardiac  Comments:  Chest pain or chest pressure:    Shortness of breath upon exertion:    Short of breath when lying flat:    Irregular heart rhythm:        Vascular    Pain in calf, thigh, or hip brought on by ambulation:    Pain in feet at night that wakes you up from your sleep:     Blood clot in your veins:    Leg swelling:         Pulmonary    Oxygen at home:    Productive cough:     Wheezing:         Neurologic    Sudden weakness in arms or legs:     Sudden numbness in arms or legs:     Sudden onset of difficulty speaking or slurred speech:    Temporary loss of vision in one eye:     Problems with dizziness:         Gastrointestinal    Blood in stool:     Vomited blood:         Genitourinary    Burning when urinating:     Blood in urine:        Psychiatric    Major depression:         Hematologic    Bleeding problems:    Problems with blood clotting too easily:        Skin    Rashes or ulcers:        Constitutional    Fever or chills:      PHYSICAL EXAMINATION:  Today's Vitals   11/21/20 1341 11/21/20 1347  BP: (!) 145/62 (!) 142/68  Pulse: (!) 48 (!) 48  Resp: 18   Temp: 97.8 F (36.6 C)   TempSrc: Temporal   SpO2: 99%   Weight: 212 lb (96.2 kg)  Height: 5' 7.5" (1.715 m)    Body mass index is 32.71 kg/m.   General:  WDWN in NAD; vital signs documented above Gait: Not observed HENT: WNL, normocephalic Pulmonary:  normal non-labored breathing Cardiac: regular HR, without carotid bruits Abdomen: soft, NT, no masses; aortic pulse is not palpable Skin: without rashes Vascular Exam/Pulses:  Right Left  Radial 2+ (normal) 2+ (normal)  Ulnar 2+ (normal) 2+ (normal)  Popliteal Unable to palpate Unable to palpate  DP 2+ (normal) 2+ (normal)  PT 2+ (normal) 2+ (normal)   Extremities: without ischemic changes, without Gangrene , without cellulitis; without open wounds Musculoskeletal: no muscle wasting or atrophy  Neurologic: A&O X 3 Psychiatric:  The pt has Normal affect.   Non-Invasive Vascular Imaging:   Carotid Duplex on 11/21/2020: Right:  60-79% ICA stenosis Left:  1-39% ICA stenosis Vertebrals: Bilateral vertebral arteries demonstrate antegrade flow.  Subclavians: Normal flow hemodynamics were seen in bilateral subclavian   arteries.  Previous Carotid duplex on 04/12/2020: Right: 60-79% ICA stenosis Left:   1-39% ICA stenosis Vertebrals: Bilateral vertebral arteries demonstrate antegrade flow.  Subclavians: Normal flow hemodynamics were seen in bilateral subclavian  arteries.   ASSESSMENT/PLAN:: 75 y.o. male here for follow up carotid artery stenosis.  -duplex today reveals 60-79% right ICA stenosis and 1-39% left ICA stenosis.  He remains asymptomatic.  He has had some right arm numbness while driving, but do not feel this is related to his left ICA stenosis of 1-39%.  -discussed s/s of stroke with pt and he understands should he develop any of these sx, he will go to the nearest ER and he expressed understanding. -pt will f/u in 6 months with carotid duplex -pt will call sooner should they have any issues. -He is unable to take antiplatelets and is on Xarelto for chronic afib.   -continue statin  Leontine Locket, [facility] [phone]  Clinic MD:  Trula Slade

## 2020-11-23 ENCOUNTER — Other Ambulatory Visit: Payer: Self-pay

## 2020-11-23 DIAGNOSIS — I6529 Occlusion and stenosis of unspecified carotid artery: Secondary | ICD-10-CM

## 2021-01-18 ENCOUNTER — Other Ambulatory Visit: Payer: Self-pay | Admitting: Cardiology

## 2021-02-02 DIAGNOSIS — Z961 Presence of intraocular lens: Secondary | ICD-10-CM | POA: Diagnosis not present

## 2021-02-02 DIAGNOSIS — H524 Presbyopia: Secondary | ICD-10-CM | POA: Diagnosis not present

## 2021-03-13 ENCOUNTER — Other Ambulatory Visit: Payer: Self-pay | Admitting: Cardiology

## 2021-03-31 ENCOUNTER — Other Ambulatory Visit: Payer: Self-pay | Admitting: Internal Medicine

## 2021-03-31 ENCOUNTER — Other Ambulatory Visit: Payer: Self-pay | Admitting: *Deleted

## 2021-03-31 DIAGNOSIS — I6529 Occlusion and stenosis of unspecified carotid artery: Secondary | ICD-10-CM

## 2021-03-31 DIAGNOSIS — R0989 Other specified symptoms and signs involving the circulatory and respiratory systems: Secondary | ICD-10-CM

## 2021-04-27 ENCOUNTER — Other Ambulatory Visit: Payer: Self-pay | Admitting: Cardiology

## 2021-04-28 ENCOUNTER — Telehealth: Payer: Self-pay | Admitting: Internal Medicine

## 2021-05-04 NOTE — Telephone Encounter (Signed)
Nothing noted in message. Will close encounter.  

## 2021-05-22 ENCOUNTER — Other Ambulatory Visit: Payer: Self-pay

## 2021-05-22 ENCOUNTER — Ambulatory Visit: Payer: Medicare HMO

## 2021-05-22 ENCOUNTER — Ambulatory Visit (HOSPITAL_COMMUNITY)
Admission: RE | Admit: 2021-05-22 | Discharge: 2021-05-22 | Disposition: A | Discharge status: Home / Self Care | Payer: Medicare HMO | Source: Ambulatory Visit | Attending: Surgery | Admitting: Surgery

## 2021-05-22 DIAGNOSIS — I6529 Occlusion and stenosis of unspecified carotid artery: Secondary | ICD-10-CM | POA: Diagnosis not present

## 2021-05-22 DIAGNOSIS — R0989 Other specified symptoms and signs involving the circulatory and respiratory systems: Secondary | ICD-10-CM | POA: Insufficient documentation

## 2021-05-24 DIAGNOSIS — G4700 Insomnia, unspecified: Secondary | ICD-10-CM | POA: Diagnosis not present

## 2021-05-24 DIAGNOSIS — I6529 Occlusion and stenosis of unspecified carotid artery: Secondary | ICD-10-CM | POA: Diagnosis not present

## 2021-05-24 DIAGNOSIS — Z7901 Long term (current) use of anticoagulants: Secondary | ICD-10-CM | POA: Diagnosis not present

## 2021-05-24 DIAGNOSIS — J45909 Unspecified asthma, uncomplicated: Secondary | ICD-10-CM | POA: Diagnosis not present

## 2021-05-24 DIAGNOSIS — G4733 Obstructive sleep apnea (adult) (pediatric): Secondary | ICD-10-CM | POA: Diagnosis not present

## 2021-05-24 DIAGNOSIS — I4891 Unspecified atrial fibrillation: Secondary | ICD-10-CM | POA: Diagnosis not present

## 2021-05-24 DIAGNOSIS — R5383 Other fatigue: Secondary | ICD-10-CM | POA: Diagnosis not present

## 2021-06-05 ENCOUNTER — Other Ambulatory Visit: Payer: Self-pay

## 2021-06-05 ENCOUNTER — Ambulatory Visit: Payer: Medicare HMO | Admitting: Physician Assistant

## 2021-06-05 VITALS — BP 132/69 | HR 75 | Temp 98.1°F | Resp 20 | Ht 67.5 in | Wt 218.4 lb

## 2021-06-05 DIAGNOSIS — I6529 Occlusion and stenosis of unspecified carotid artery: Secondary | ICD-10-CM

## 2021-06-05 NOTE — Progress Notes (Signed)
Office Note     CC:  follow up Requesting Provider:  [name], MD  HPI: Jared Whitaker. is a 75 y.o. (10/19/1946) male who presents for surveillance of carotid artery stenosis.  He denies any diagnosis of CVA or TIA since last office visit.  He also denies any current strokelike symptoms including slurring speech, changes in vision, or one-sided weakness.  He is on Xarelto for atrial fibrillation.  He is on a statin daily.   Past Medical History:  Diagnosis Date   Anemia    Anemia    Colon polyp    Tubular adenoma- in colonoscopy in 07/2006 followed by GI Dr Armanda Magic dermatitis    Dilated aortic root ([place])    ascending aorta 57m by echo 07/2017   Hyperlipidemia    pateint denies this   Hypertension    patient denies this   Normal cardiac stress test    Osteoarthritis    of the shoulders   Overweight    Persistent atrial fibrillation (HCC)    chads2vasc score is 1-2 (pt denies HTN)   Shortness of breath    Snoring    w/u for apnea is in progress   Stroke ([facility]     Past Surgical History:  Procedure Laterality Date   CARDIOVERSION N/A 03/03/2014   Procedure: CARDIOVERSION;  Surgeon: JCarlena Bjornstad MD;  [facility];  Service: Cardiovascular;  Laterality: N/A;   CARDIOVERSION N/A 01/31/2015   Procedure: CARDIOVERSION;  Surgeon: JCarlena Bjornstad MD;  [facility];  Service: Cardiovascular;  Laterality: N/A;   COLONOSCOPY     left leg fracture  1969    Social History   Socioeconomic History   Marital status: Single    Spouse name: Not on file   Number of children: Not on file   Years of education: Not on file   Highest education level: Not on file  Occupational History   Not on file  Tobacco Use   Smoking status: Never   Smokeless tobacco: Never   Tobacco comments:    Secondhand smoke exposure  Vaping Use   Vaping Use: Never used  Substance and Sexual Activity   Alcohol use: Yes    Alcohol/week: 1.0 - 2.0 standard drink     Types: 1 - 2 Cans of beer per week    Comment: 3 six packs a week   Drug use: No   Sexual activity: Not on file  Other Topics Concern   Not on file  Social History Narrative   [place]  Works an a cNurse, adult   Social Determinants of Health   Financial Resource Strain: Not on file  Food Insecurity: Not on file  Transportation Needs: Not on file  Physical Activity: Not on file  Stress: Not on file  Social Connections: Not on file  Intimate Partner Violence: Not on file    Family History  Problem Relation Age of Onset   CVA Mother    Heart attack Mother    Heart attack Father        smoker   Bladder Cancer Father    Cancer Paternal Uncle     Current Outpatient Medications  Medication Sig Dispense Refill   carboxymethylcellulose (REFRESH PLUS) 0.5 % SOLN 3 (three) times daily as needed     Cyanocobalamin (VITAMIN B 12 PO) Take by mouth. Daily     fluticasone furoate-vilanterol (BREO ELLIPTA) 100-25 MCG/INH AEPB Inhale 1 puff into the lungs daily.  180 each 0   furosemide (LASIX) 20 MG tablet Take 1 tablet (20 mg total) by mouth daily as needed. For swelling. Please make overdue appt with Dr. [name] before anymore refills. Thank you 3rd and Final Attempt 15 tablet 0   rivaroxaban (XARELTO) 20 MG TABS tablet Take 1 tablet (20 mg total) by mouth daily with supper. 90 tablet 1   rosuvastatin (CRESTOR) 5 MG tablet Take 5 mg by mouth daily.     traZODone (DESYREL) 50 MG tablet Take by mouth.     No current facility-administered medications for this visit.    Allergies  Allergen Reactions   Penicillins Rash    Purple spots, swelling     REVIEW OF SYSTEMS:   '[X]'$  denotes positive finding, '[ ]'$  denotes negative finding Cardiac  Comments:  Chest pain or chest pressure:    Shortness of breath upon exertion:    Short of breath when lying flat:    Irregular heart rhythm:        Vascular    Pain in calf, thigh, or hip brought on by ambulation:     Pain in feet at night that wakes you up from your sleep:     Blood clot in your veins:    Leg swelling:         Pulmonary    Oxygen at home:    Productive cough:     Wheezing:         Neurologic    Sudden weakness in arms or legs:     Sudden numbness in arms or legs:     Sudden onset of difficulty speaking or slurred speech:    Temporary loss of vision in one eye:     Problems with dizziness:         Gastrointestinal    Blood in stool:     Vomited blood:         Genitourinary    Burning when urinating:     Blood in urine:        Psychiatric    Major depression:         Hematologic    Bleeding problems:    Problems with blood clotting too easily:        Skin    Rashes or ulcers:        Constitutional    Fever or chills:      PHYSICAL EXAMINATION:  Vitals:   06/05/21 1440 06/05/21 1443  BP: 127/67 132/69  Pulse: 75   Resp: 20   Temp: 98.1 F (36.7 C)   TempSrc: Temporal   SpO2: 98%   Weight: 218 lb 6.4 oz (99.1 kg)   Height: 5' 7.5" (1.715 m)     General:  WDWN in NAD; vital signs documented above Gait: Not observed HENT: WNL, normocephalic Pulmonary: normal non-labored breathing , without Rales, rhonchi,  wheezing Cardiac: regular HR Abdomen: soft, NT, no masses Skin: without rashes Vascular Exam/Pulses: symmetrical radial pulses Extremities: without ischemic changes, without Gangrene , without cellulitis; without open wounds;  Musculoskeletal: no muscle wasting or atrophy  Neurologic: A&O X 3;  CN grossly intact Psychiatric:  The pt has Normal affect.   Non-Invasive Vascular Imaging:   R ICA 40-59% stenosis L ICA 1-39% stenosis    ASSESSMENT/PLAN:: 75 y.o. male here for follow up for surveillance of carotid artery stenosis  -Subjectively without any neurological symptoms since last office visit -Unable to replicate the elevated velocities seen 6 months ago on carotid duplex; R  ICA only 40-59% stenosis; no indication for  revascularization -Continue statin daily -Recheck carotid duplex in 1 year   Dagoberto Ligas, PA-C [facility] [phone]  Clinic MD:   Trula Slade

## 2021-06-26 ENCOUNTER — Encounter: Payer: Self-pay | Admitting: Internal Medicine

## 2021-06-26 ENCOUNTER — Ambulatory Visit: Payer: Medicare HMO | Admitting: Internal Medicine

## 2021-06-26 ENCOUNTER — Other Ambulatory Visit: Payer: Self-pay

## 2021-06-26 VITALS — BP 116/64 | HR 48 | Temp 97.9°F | Ht 67.5 in | Wt 220.8 lb

## 2021-06-26 DIAGNOSIS — Z8669 Personal history of other diseases of the nervous system and sense organs: Secondary | ICD-10-CM

## 2021-06-26 DIAGNOSIS — R5382 Chronic fatigue, unspecified: Secondary | ICD-10-CM

## 2021-06-26 DIAGNOSIS — J4540 Moderate persistent asthma, uncomplicated: Secondary | ICD-10-CM | POA: Diagnosis not present

## 2021-06-26 DIAGNOSIS — R0982 Postnasal drip: Secondary | ICD-10-CM | POA: Diagnosis not present

## 2021-06-26 NOTE — Progress Notes (Signed)
PCP Gennette Pac, MD   HPI  IOV 08/24/2016  Chief Complaint  Patient presents with   Pulmonary Consult    Pt referred by Dr. [name] for COPD. Pt states he has intermittent SOB with activity, throat clearing at night - occassional mucus production clear to light yellow in color. Pt denies CP/tightness and f/c/s.     75 year old male. Nonsmoker. Former Special educational needs teacher. Only medical history is failed cardioversion with A. fib. He is always in slow A. fib. He drinks a lot of for this. He is not on any rate control medications. His mom had asthma. [place]. His dad smokes heavily. He was exposed to passive smoking from the age of college. After that no passive smoking no active smoking. Has been in stable health except for the last 1 year has noticed insidious onset of shortness of breath puncture weighted by episodes of shortness of breath. First episode was 6-7 months ago that came on subacutely and he needed to rest in the long chair for several hours before he felt better. Second episode was early September 2017. He had to call EMS. His pulse ox was low normal. Apparently EKG was normal. He was reassured was an anxiety attack. He thought he had active atrial fibrillation. This then resulted in a visit with Dr. [name] who did an extensive workup documented below and as per review of the chart. It was deemed that it for fibrillation was not the issue and therefore he has b een referred here. He tells me that in between these episodes he does have random shortness of breath that is variable. Sometimes with mild exertion. But sometimes is able to do heavy exertion such as mowing the yard without any problems. Temperature changes scan make the shortness of breath worse. Viral infections to make the episodes significantly worse. He has never tried any inhalers. Workup resulted in pulmonary function test documented below and no suspicion of COPD  and he is been referred here.  Pulmonary function test 07/31/2016: FVC 2.27 L/59%, FEV1 1.54 L/54% and a ratio 68 suggestive of mixed obstruction  Total lung capacity 5.4 L/84% and normal. DLC of 24.8/87% and normal. FEV1 postbronchodilator 1.9/68% which is a 3 60 mL degree improvement/24%  feNo 95ppb    Chest x-ray September 2017: Personally visualized and is clear    OV 10/12/2016    Chief Complaint  Patient presents with   Follow-up    Pt states he is doing well on the Breo. Pt has not needed the aluterol hfa. Pt denies significant cough, denies CP/tightness.     Follow-up newly diagnosed asthma at last visit. He is now on Brio daily. He says he feels fantastic after starting Brio. His asthma control questionnaire shows symptom score of 0. He does not wake up in the night anymore. When he wakes up his symptoms. He is not limited in his activities. He does not have any shortness of breath with activities. He does not wheeze. He does not use albuterol for rescue. Exhaled nitric oxide show significant improvement from 95 at last visit the 32. He simply amazed about the improvement he has had. His strong family history of asthma. He has not had his flu shot and will have it today.   OV 08/14/2017  Chief Complaint  Patient presents with   Follow-up    PFT done today. States that he is doing good ever since he began taking  breo. Denies any cough, SOB, or CP.    Follow-up moderate persistent asthma. Is a one year follow-up. He is on Brio daily. He feels great without any albuterol use. No interim exacerbations. No nocturnal awakenings. No daytime symptoms. He is actively moving his house and he does not feel any shortness of breath is not limited in his activities. He did spirometry today and he is improved significantly but he still in the moderate persistent range. He does not want flu shot today because of his move  New issue is that he's complaining of dizziness. He had random  dizziness while walking 2 years ago and he nearly fell down. Then yesterday while in the midst of the move he was sitting and drinking Diet Coke when he felt dizzy while sitting. There is no focal neurologic deficits. I will defer this issue to primary care physician         Plan:      Well controlled on symptoms with breo Lung functinon better but at 2/3rd normal and so in persistent range  Plan - given good subjective control, I do not feel we need to escalate treatment  - continue breo daily - take samples  - talk to [facility] and ask for 90 day supply or alternatives that might be cheaper (eg: symbicort, dulera, advair, airduo) - high dose flu shot with pcp or [facility] when able - Please talk to PCP [name], MD -  and ensure you get  shingarix vaccine - albuterol as needed  - discuss dizziness with PCP Little, Lennette Bihari, MD    followup 1 year or sooner if needed   > 50% of this > 25 min visit spent in face to face counseling or coordination of care    Dr. Brand Males, M.D., [facility].C.P Pulmonary and Critical Care Medicine Staff Physician [facility] Pager: [phone], If no answer or between  15:00h - 7:00h: call [phone]  08/14/2017 10:49 AM        OV 06/26/2021  Subjective:  Patient ID: Jared Ochoa., male , DOB: 1946/06/29 , age 11 y.o. , MRN: [id number] , ADDRESS: [address] [place] [id number] PCP [name], MD Patient Care Team: [name], MD as PCP - General (Family Medicine) Sueanne Margarita, MD as PCP - Cardiology (Cardiology) Wonda Horner, MD as Consulting Physician (Gastroenterology)  This Provider for this visit: Treatment Team:  Attending Provider: Brand Males, MD    06/26/2021 -   Chief Complaint  Patient presents with   Consult    Prior pt of MR last seen 08/2017.  Pt said that he has had some problems with sinus issues.     HPI Jared Whitaker. 75  y.o. -returns for follow-up but it is really a new consult because it has been over 3 years since I last saw him.  He tells me that he ran out of his Breo 3 months ago and because he has not been seen in over 3 years he is a new patient and would require a visit for refill of the [facility].  He also tells me that since stopping the [facility] he is noticing that when he lies down at he has postnasal drainage that gets better when he sits up.  This is forcing him to sleep sitting up.  In addition he notices some shortness of breath when he lies down when he tries to sleep.  He thinks it  might be because of atrial fibrillation.  He is also gained 15 pounds in the last few years during the pandemic.  He says he has a history of sleep apnea that was tested positive for mild sleep apnea 3 years ago and was advised to take CPAP but he has not followed through on that.  He does not have a sleep specialist.  He sees Dr. [name] but he says she is not the one who is managing his sleep issues.  He does feel excessively fatigued during the daytime.  He is denying active coughing or wheezing but definitely all the symptoms are worse after stopping the Breo.        Asthma Control Panel 08/24/16 - new 10/12/2016  08/14/2017  06/26/2021   Current Med Regimen none breo  Off breo x 3 month  ACQ 5 point- 1 week. wtd avg score. <1.0 is good control 0.75-1.25 is grey zone. >1.25 poor control. Delta 0.5 is clinically meaningful  0  x  ACT test    18  fev1 1.54L/54%  1.71L /61% -> 1/87L/67%   Fno 95 32  Cannot do due to lack of covid vaxx card  Planned intervention  for visit  Continue breo     Asthma Control Test ACT Total Score  06/26/2021 18      PFT  PFT Results Latest Ref Rng & Units 08/14/2017 07/31/2016  FVC-Pre L 2.48 2.27  FVC-Predicted Pre % 65 59  FVC-Post L 2.58 2.63  FVC-Predicted Post % 67 68  Pre FEV1/FVC % % 69 68  Post FEV1/FCV % % 72 73  FEV1-Pre L 1.71 1.54  FEV1-Predicted Pre % 61 54  FEV1-Post L  1.87 1.91  DLCO uncorrected ml/min/mmHg - 24.80  DLCO UNC% % - 87  DLVA Predicted % - 122  TLC L - 5.42  TLC % Predicted % - 84  RV % Predicted % - 126       has a past medical history of Anemia, Anemia, Colon polyp, Contact dermatitis, Dilated aortic root (HCC), Hyperlipidemia, Hypertension, Normal cardiac stress test, Osteoarthritis, Overweight, Persistent atrial fibrillation (HCC), Shortness of breath, Snoring, and Stroke ([place]).   reports that he has never smoked. He has never used smokeless tobacco.  Past Surgical History:  Procedure Laterality Date   CARDIOVERSION N/A 03/03/2014   Procedure: CARDIOVERSION;  Surgeon: Carlena Bjornstad, MD;  Location: [place];  Service: Cardiovascular;  Laterality: N/A;   CARDIOVERSION N/A 01/31/2015   Procedure: CARDIOVERSION;  Surgeon: Carlena Bjornstad, MD;  Location: [place];  Service: Cardiovascular;  Laterality: N/A;   COLONOSCOPY     left leg fracture  1969    Allergies  Allergen Reactions   Penicillins Rash    Purple spots, swelling    Immunization History  Administered Date(s) Administered   Dtap, Unspecified 08/12/2020   Influenza, High Dose Seasonal PF 10/12/2016, 09/20/2018   Influenza-Unspecified 09/27/2017   Zoster Recombinat (Shingrix) 09/20/2018   Zoster, Live 11/12/2017    Family History  Problem Relation Age of Onset   CVA Mother    Heart attack Mother    Heart attack Father        smoker   Bladder Cancer Father    Cancer Paternal Uncle      Current Outpatient Medications:    carboxymethylcellulose (REFRESH PLUS) 0.5 % SOLN, 3 (three) times daily as needed, Disp: , Rfl:    Cyanocobalamin (VITAMIN B 12 PO), Take by mouth. Daily, Disp: , Rfl:    furosemide (  LASIX) 20 MG tablet, Take 1 tablet (20 mg total) by mouth daily as needed. For swelling. Please make overdue appt with Dr. [name] before anymore refills. Thank you 3rd and Final Attempt, Disp: 15 tablet, Rfl: 0   rivaroxaban (XARELTO) 20 MG TABS tablet,  Take 1 tablet (20 mg total) by mouth daily with supper., Disp: 90 tablet, Rfl: 1   rosuvastatin (CRESTOR) 5 MG tablet, Take 5 mg by mouth daily., Disp: , Rfl:    traZODone (DESYREL) 50 MG tablet, Take by mouth., Disp: , Rfl:    fluticasone furoate-vilanterol (BREO ELLIPTA) 100-25 MCG/INH AEPB, Inhale 1 puff into the lungs daily. (Patient not taking: Reported on 06/26/2021), Disp: 180 each, Rfl: 0      Objective:   Vitals:   06/26/21 1417  BP: 116/64  Pulse: (!) 48  Temp: 97.9 F (36.6 C)  TempSrc: Oral  SpO2: 100%  Weight: 220 lb 12.8 oz (100.2 kg)  Height: 5' 7.5" (1.715 m)    Estimated body mass index is 34.07 kg/m as calculated from the following:   Height as of this encounter: 5' 7.5" (1.715 m).   Weight as of this encounter: 220 lb 12.8 oz (100.2 kg).  '@WEIGHTCHANGE'$ @  [employer]   06/26/21 1417  Weight: 220 lb 12.8 oz (100.2 kg)     Physical Exam  General Appearance:    Alert, cooperative, no distress, appears stated age - yes , Deconditioned looking - no , OBESE  - no, Sitting on Wheelchair -  no  Head:    Normocephalic, without obvious abnormality, atraumatic  Eyes:    PERRL, conjunctiva/corneas clear,  Ears:    Normal TM's and external ear canals, both ears  Nose:   Nares normal, septum midline, mucosa normal, no drainage    or sinus tenderness. OXYGEN ON  - no . Patient is @ ra   Throat:   Lips, mucosa, and tongue normal; teeth and gums normal. Cyanosis on lips - no  Neck:   Supple, symmetrical, trachea midline, no adenopathy;    thyroid:  no enlargement/tenderness/nodules; no carotid   bruit or JVD  Back:     Symmetric, no curvature, ROM normal, no CVA tenderness  Lungs:     Distress - no , Wheeze no, Barrell Chest - no, Purse lip breathing - no, Crackles - no   Chest Wall:    No tenderness or deformity.    Heart:    Regular rate and rhythm, S1 and S2 normal, no rub   or gallop, Murmur - no  Breast Exam:    NOT DONE  Abdomen:     Soft, non-tender, bowel  sounds active all four quadrants,    no masses, no organomegaly. Visceral obesity - yes  Genitalia:   NOT DONE  Rectal:   NOT DONE  Extremities:   Extremities - normal, Has Cane - no, Clubbing - no, Edema - no  Pulses:   2+ and symmetric all extremities  Skin:   Stigmata of Connective Tissue Disease - no  Lymph nodes:   Cervical, supraclavicular, and axillary nodes normal  Psychiatric:  Neurologic:   Pleasant - yes, Anxious - no, Flat affect - no  CAm-ICU - neg, Alert and Oriented x 3 - yes, Moves all 4s - yes, Speech - normal, Cognition - intact        Assessment:       ICD-10-CM   1. Moderate persistent asthma, unspecified whether complicated  123456     2. Post-nasal drip  R09.82     3. History of obstructive sleep apnea  Z86.69     4. Chronic fatigue  R53.82          Plan:     Patient Instructions  Moderate persistent asthma, unspecified whether complicated  -As some of the recurring symptoms appear to be asthma related in the absence of Breo for the last 3 months  Plan - Do nitric oxide test today if possible (Cannot do due to lack of clarity on vaccination status) - Restart Breo 100 mcg dose 1 puff daily with albuterol as needed -Do spirometry and DLCO with and without bronchodilator in the next few weeks to few months -Do ACT score at follow-up and nitric oxide at follow-up  Post-nasal drip   take generic fluticasone inhaler 2 squirts each nostril daily   History of obstructive sleep apnea Chronic fatigue  -Some of the excessive daytime fatigue and the persistent atrial fibrillation could be due to uncontrolled sleep apnea particularly with the recent 15 pound weight gain in the last few years  Plan - You can see Dr. [name] for sleep apnea or get referred to a sleep specialist at [place] - Return to see one of our nurse practitioners at the [facility] clinic in the next few weeks to few months for follow-up    SIGNATURE    Dr. Brand Males, M.D., F.C.C.P,  Pulmonary and Critical Care Medicine Staff Physician, [place] Director - Interstitial Lung Disease  Program  Pulmonary [place] at [place], [place], [address]  Pager: [phone], If no answer or between  15:00h - 7:00h: call [phone] Telephone: [phone]  3:13 PM 06/26/2021

## 2021-06-26 NOTE — Patient Instructions (Addendum)
Moderate persistent asthma, unspecified whether complicated  -As some of the recurring symptoms appear to be asthma related in the absence of Breo for the last 3 months  Plan - Do nitric oxide test today if possible (Cannot do due to lack of clarity on vaccination status) - Restart Breo 100 mcg dose 1 puff daily with albuterol as needed -Do spirometry and DLCO with and without bronchodilator in the next few weeks to few months -Do ACT score at follow-up and nitric oxide at follow-up  Post-nasal drip   take generic fluticasone inhaler 2 squirts each nostril daily   History of obstructive sleep apnea Chronic fatigue  -Some of the excessive daytime fatigue and the persistent atrial fibrillation could be due to uncontrolled sleep apnea particularly with the recent 15 pound weight gain in the last few years  Plan - You can see Dr. [name] for sleep apnea or get referred to a sleep specialist at [place] - Return to see one of our nurse practitioners at the [facility] clinic in the next few weeks to few months for follow-up

## 2021-06-27 NOTE — Addendum Note (Signed)
Addended by: Lorretta Harp on: 06/27/2021 10:31 AM   Modules accepted: Orders

## 2021-06-30 ENCOUNTER — Other Ambulatory Visit: Payer: Self-pay | Admitting: Cardiology

## 2021-07-03 NOTE — Telephone Encounter (Signed)
Xarelto 20 mg refill request received. Pt is 75 years old, weight- 100.2 kg, Crea- 1.3 on 05/24/21 via epic, last seen by Dr. [name] on 30/30/21, has appt to see Dr. Saunders Revel in [place] on 07/05/21. Diagnosis-afib, CrCl- 67.44; Dose is appropriate based on dosing criteria. Will send in refill to requested pharmacy.

## 2021-07-05 ENCOUNTER — Other Ambulatory Visit: Payer: Self-pay

## 2021-07-05 ENCOUNTER — Other Ambulatory Visit: Payer: Self-pay | Admitting: *Deleted

## 2021-07-05 ENCOUNTER — Encounter: Payer: Self-pay | Admitting: Internal Medicine

## 2021-07-05 ENCOUNTER — Ambulatory Visit: Payer: Medicare HMO | Admitting: Internal Medicine

## 2021-07-05 VITALS — BP 152/70 | HR 63 | Ht 67.5 in | Wt 217.5 lb

## 2021-07-05 DIAGNOSIS — R030 Elevated blood-pressure reading, without diagnosis of hypertension: Secondary | ICD-10-CM | POA: Diagnosis not present

## 2021-07-05 DIAGNOSIS — I4821 Permanent atrial fibrillation: Secondary | ICD-10-CM | POA: Diagnosis not present

## 2021-07-05 DIAGNOSIS — I712 Thoracic aortic aneurysm, without rupture, unspecified: Secondary | ICD-10-CM

## 2021-07-05 DIAGNOSIS — R0609 Other forms of dyspnea: Secondary | ICD-10-CM

## 2021-07-05 DIAGNOSIS — R600 Localized edema: Secondary | ICD-10-CM

## 2021-07-05 DIAGNOSIS — R0600 Dyspnea, unspecified: Secondary | ICD-10-CM

## 2021-07-05 DIAGNOSIS — E785 Hyperlipidemia, unspecified: Secondary | ICD-10-CM

## 2021-07-05 MED ORDER — RIVAROXABAN 20 MG PO TABS: 20.0000 mg | ORAL_TABLET | Freq: Every day | ORAL | 1 refills | Status: DC

## 2021-07-05 NOTE — Telephone Encounter (Signed)
Pt saw Dr End today 07/05/21, last labs 05/24/21 Creat 1.3, age 75, weight 98.7kg, CrCl 66.43, based on CrCl pt is on appropriate dosage of Xarelto '20mg'$  QD.  Will refill rx.

## 2021-07-05 NOTE — Patient Instructions (Addendum)
Medication Instructions:   Your physician recommends that you continue on your current medications as directed. Please refer to the Current Medication list given to you today.  *If you need a refill on your cardiac medications before your next appointment, please call your pharmacy*   Lab Work:  None ordered  Testing/Procedures:  Your physician has requested that you have an echocardiogram. Echocardiography is a painless test that uses sound waves to create images of your heart. It provides your doctor with information about the size and shape of your heart and how well your heart's chambers and valves are working. This procedure takes approximately one hour. There are no restrictions for this procedure.   Follow-Up: At [facility], you and your health needs are our priority.  As part of our continuing mission to provide you with exceptional heart care, we have created designated Provider Care Teams.  These Care Teams include your primary Cardiologist (physician) and Advanced Practice Providers (APPs -  Physician Assistants and Nurse Practitioners) who all work together to provide you with the care you need, when you need it.  We recommend signing up for the patient portal called "MyChart".  Sign up information is provided on this After Visit Summary.  MyChart is used to connect with patients for Virtual Visits (Telemedicine).  Patients are able to view lab/test results, encounter notes, upcoming appointments, etc.  Non-urgent messages can be sent to your provider as well.   To learn more about what you can do with MyChart, go to [email or web address].    Your next appointment:   2 month(s)  The format for your next appointment:   In Person  Provider:   You may see Dr. Saunders Revel or one of the following Advanced Practice Providers on your designated Care Team:   Murray Hodgkins, NP Christell Faith, PA-C Marrianne Mood, PA-C Cadence Poipu, [place]

## 2021-07-05 NOTE — Telephone Encounter (Signed)
Pt seen in office today. Please send in refill for Xarelto for 90 day to [facility].

## 2021-07-05 NOTE — Progress Notes (Signed)
Follow-up Outpatient Visit Date: 07/05/2021  Primary Care Provider: Leonel Ramsay, MD Jared Whitaker [id number]  Chief Complaint: Difficulty sleeping  HPI:  Jared Whitaker is a 75 y.o. male with history of permanent atrial fibrillation on Xarelto.  He has failed flecainide in the past.  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  He declined further rhythm control strategies.  He has chronic right bundle branch block, asthma, mild-moderate carotid artery stenosis followed by vascular surgery, and dilated ascending aorta and aortic root. Nuclear stress test low risk with normal perfusion LVEF 57% in 2017.  Last 2D echo 07/2017 normal LV EF 55-60% with mild LVH and mildly dilated asending aorta 40 mm and aortic root 37 mm.  He was previously followed in our [facility]. office in [place] by Dr. [name] but has asked to transition to the Center office.  He was doing well at his last visit with Dr. [name] in 01/2020.  Today, Jared Whitaker is most concerned about difficulty sleeping.  He was tried on trazodone by Dr. Ola Spurr but did not have any success with this.  He also reports feeling short of breath when he takes out his trash.  He notes that he had been walking up to 2 miles a day before injuring his knee a few months ago.  Over the last 6-8 months, he has noticed progressive exertional dyspnea.  He also reports that he stopped taking his asthma medication 4 months ago and believes this may be contributing.  He recently saw his pulmonologist and is scheduled for further testing to determine if reinitiation of inhaled therapies is indicated.  He was previously advised that he has mild obstructive sleep apnea.  CPAP was recommended but he opted to defer this.  Jared Whitaker notes mild chronic edema that has been fairly well controlled with furosemide.  He has not had any chest pain, palpitations, lightheadedness/dizziness,  syncope, or bleeding.  --------------------------------------------------------------------------------------------------  Past Medical History:  Diagnosis Date   Anemia    Anemia    Colon polyp    Tubular adenoma- in colonoscopy in 07/2006 followed by GI Dr Armanda Magic dermatitis    Dilated aortic root ([place])    ascending aorta 70m by echo 07/2017   Hyperlipidemia    pateint denies this   Hypertension    patient denies this   Normal cardiac stress test    Osteoarthritis    of the shoulders   Overweight    Persistent atrial fibrillation ([place]    chads2vasc score is 1-2 (pt denies HTN)   Shortness of breath    Snoring    w/u for apnea is in progress   Stroke ([facility]    Past Surgical History:  Procedure Laterality Date   CARDIOVERSION N/A 03/03/2014   Procedure: CARDIOVERSION;  Surgeon: JCarlena Bjornstad MD;  [place] [facility]  Service: Cardiovascular;  Laterality: N/A;   CARDIOVERSION N/A 01/31/2015   Procedure: CARDIOVERSION;  Surgeon: JCarlena Bjornstad MD;  [place]  Service: Cardiovascular;  Laterality: N/A;   COLONOSCOPY     left leg fracture  1969    Current Meds  Medication Sig   carboxymethylcellulose (REFRESH PLUS) 0.5 % SOLN 3 (three) times daily as needed   Cyanocobalamin (VITAMIN B 12 PO) Take by mouth. Daily   fluticasone furoate-vilanterol (BREO ELLIPTA) 100-25 MCG/INH AEPB Inhale 1 puff into the lungs daily.   furosemide (LASIX) 20 MG tablet  Take 1 tablet (20 mg total) by mouth daily as needed. For swelling. Please make overdue appt with Dr. [name] before anymore refills. Thank you 3rd and Final Attempt   rivaroxaban (XARELTO) 20 MG TABS tablet Take 1 tablet (20 mg total) by mouth daily with supper.   rosuvastatin (CRESTOR) 5 MG tablet Take 5 mg by mouth daily.   traZODone (DESYREL) 50 MG tablet Take by mouth.    Allergies: Penicillins  Social History   Tobacco Use   Smoking status: Never   Smokeless tobacco: Never   Tobacco  comments:    Secondhand smoke exposure  Vaping Use   Vaping Use: Never used  Substance Use Topics   Alcohol use: Yes    Alcohol/week: 1.0 - 2.0 standard drink    Types: 1 - 2 Cans of beer per week    Comment: 3 six packs a week   Drug use: No    Family History  Problem Relation Age of Onset   CVA Mother    Heart attack Mother    Heart attack Father        smoker   Bladder Cancer Father    Cancer Paternal Uncle     Review of Systems: A 12-system review of systems was performed and was negative except as noted in the HPI.  --------------------------------------------------------------------------------------------------  Physical Exam: BP (!) 152/70 (BP Location: Left Arm, Patient Position: Sitting, Cuff Size: Normal)   Pulse 63   Ht 5' 7.5" (1.715 m)   Wt 217 lb 8 oz (98.7 kg)   SpO2 96%   BMI 33.56 kg/m   General:  NAD. Neck: No JVD or HJR. Lungs: Clear to auscultation bilaterally without wheezes or crackles. Heart: Irregularly irregular rhythm with 1/6 systolic murmur. Abdomen: Soft, nontender, nondistended. Extremities: Trace ankle edema.  EKG: Atrial fibrillation with incomplete RBBB and borderline LVH.  No significant change since 02/09/2020.  Lab Results  Component Value Date   WBC 7.4 10/16/2018   HGB 12.7 (L) 10/16/2018   HCT 38.5 10/16/2018   MCV 98 (H) 10/16/2018   PLT 274 10/16/2018    Lab Results  Component Value Date   NA 139 12/08/2019   K 4.5 12/08/2019   CL 104 12/08/2019   CO2 23 12/08/2019   BUN 12 12/08/2019   CREATININE 1.12 12/08/2019   GLUCOSE 91 12/08/2019   ALT 17 12/08/2019   Outside lipid panel (05/24/2021): Total cholesterol 152, triglycerides 145, LDL 72, HDL 51  --------------------------------------------------------------------------------------------------  ASSESSMENT AND PLAN: Permanent atrial fibrillation: Jared Whitaker has longstanding atrial fibrillation and previously failed flecainide.  He was evaluated by Dr.  [name] in the remote past and elected to defer further rhythm control strategies.  Today, he inquires about potential for restoring sinus rhythm, as he wonders if his exertional dyspnea may be a manifestation of atrial fibrillation.  I think it would be difficult to maintain sinus rhythm given his longstanding atrial fibrillation.  I have recommended that we continue rivaroxaban for stroke prevention and obtain an echocardiogram to assess for new structural abnormalities that may explain his progressive exertional dyspnea.  We could refer him back to EP for further evaluation if no other cause for his dyspnea is identified.  Thoracic aortic aneurysm: Mildly dilated aortic root and ascending aorta noted on prior echoes.  We will repeat an echocardiogram to reassess this and consider cross-sectional imaging if there has been any significant change.  Dyspnea on exertion: Progressive over the last 6-8 months.  This is likely multifactorial including  underlying cardiac and pulmonary disease.  We will obtain an echocardiogram; if this is unchanged we will need to consider an ischemia evaluation.  I have also encouraged Jared Whitaker to move forward with pulmonary testing recommended by Dr. Chase Caller.  Leg edema: Well-controlled with low-dose furosemide.  Continue to use as needed.  Hyperlipidemia: Lipids reasonably well controlled on last check in July through Dr. Blane Ohara office.  Continue rosuvastatin.  Elevated blood pressure: Blood pressure mildly elevated today but typically better at other provider visits over the last few months.  Defer medication changes today.  Follow-up: Return to clinic in 2 months.  Nelva Bush, MD 07/05/2021 3:12 PM

## 2021-07-05 NOTE — Telephone Encounter (Signed)
Pt seen in office today with Dr. Saunders Revel. Please send refills for Xarelto 90 day supply to [facility].

## 2021-07-05 NOTE — Telephone Encounter (Signed)
Xarelto 20 mg refill request received. Pt is 75 years old, weight- 100.2 kg, Crea- 1.3 on 05/24/21 via epic, last seen by Dr. [name] on 30/30/21, has appt to see Dr. Saunders Revel in [place] on 07/05/21. Diagnosis-afib, CrCl- 67.44; Dose is appropriate based on dosing criteria. Will send in refill to requested pharmacy.

## 2021-07-07 ENCOUNTER — Ambulatory Visit (INDEPENDENT_AMBULATORY_CARE_PROVIDER_SITE_OTHER): Payer: Medicare HMO

## 2021-07-07 ENCOUNTER — Other Ambulatory Visit: Payer: Self-pay

## 2021-07-07 ENCOUNTER — Encounter: Payer: Self-pay | Admitting: Internal Medicine

## 2021-07-07 DIAGNOSIS — R600 Localized edema: Secondary | ICD-10-CM | POA: Insufficient documentation

## 2021-07-07 DIAGNOSIS — R0609 Other forms of dyspnea: Secondary | ICD-10-CM

## 2021-07-07 DIAGNOSIS — I4821 Permanent atrial fibrillation: Secondary | ICD-10-CM | POA: Diagnosis not present

## 2021-07-07 DIAGNOSIS — R0600 Dyspnea, unspecified: Secondary | ICD-10-CM

## 2021-07-07 DIAGNOSIS — I712 Thoracic aortic aneurysm, without rupture, unspecified: Secondary | ICD-10-CM | POA: Insufficient documentation

## 2021-07-07 LAB — ECHOCARDIOGRAM COMPLETE
AV Vena cont: 0.6 cm
Calc EF: 74.9 %
P 1/2 time: 653 msec
S' Lateral: 3.4 cm
Single Plane A2C EF: 70.8 %
Single Plane A4C EF: 78.5 %

## 2021-07-07 MED ORDER — PERFLUTREN LIPID MICROSPHERE
1.0000 mL | INTRAVENOUS | Status: AC | PRN
  Administered 2021-07-07: 2 mL via INTRAVENOUS

## 2021-07-10 ENCOUNTER — Other Ambulatory Visit: Payer: Self-pay | Admitting: Internal Medicine

## 2021-07-10 MED ORDER — FUROSEMIDE 20 MG PO TABS: 20.0000 mg | ORAL_TABLET | Freq: Every day | ORAL | 3 refills | Status: DC | PRN

## 2021-07-10 NOTE — Telephone Encounter (Signed)
*  STAT* If patient is at the pharmacy, call can be transferred to refill team.   1. Which medications need to be refilled? (please list name of each medication and dose if known) Lasix  2. Which pharmacy/location (including street and city if local pharmacy) is medication to be sent to? [facility]  3. Do they need a 30 day or 90 day supply? [place]

## 2021-07-12 ENCOUNTER — Telehealth: Payer: Self-pay | Admitting: *Deleted

## 2021-07-12 NOTE — Addendum Note (Signed)
Addended by: Britt Bottom on: 07/12/2021 02:33 PM   Modules accepted: Orders

## 2021-07-12 NOTE — Telephone Encounter (Signed)
Attempted to call pt. No answer. Lmtcb.  

## 2021-07-12 NOTE — Telephone Encounter (Signed)
-----   Message from Nelva Bush, MD sent at 07/11/2021  6:42 AM EDT ----- Please let Jared Whitaker know that his heart's pumping function remains normal.  There is moderate leakage of the mitral valve, which has increased since 12/2019.  Mild enlargement of the ascending aorta is stable.  I recommend that Jared Whitaker move forward with his pulmonary evaluation and follow-up with [place] as scheduled in October to reassess his symptoms and discuss the need for further testing/intervention.

## 2021-07-13 ENCOUNTER — Other Ambulatory Visit: Payer: Self-pay

## 2021-07-13 MED ORDER — FUROSEMIDE 20 MG PO TABS: 20.0000 mg | ORAL_TABLET | Freq: Every day | ORAL | 3 refills | Status: DC | PRN

## 2021-07-13 NOTE — Telephone Encounter (Signed)
Patient returning call.

## 2021-07-13 NOTE — Telephone Encounter (Signed)
The patient has been notified of the result and verbalized understanding.  All questions (if any) were answered.     

## 2021-07-13 NOTE — Telephone Encounter (Signed)
Attempted to call pt. No answer. Lmtcb.  

## 2021-07-18 ENCOUNTER — Ambulatory Visit: Payer: Medicare HMO | Admitting: Primary Care

## 2021-07-18 ENCOUNTER — Encounter: Payer: Self-pay | Admitting: Primary Care

## 2021-07-18 ENCOUNTER — Ambulatory Visit (INDEPENDENT_AMBULATORY_CARE_PROVIDER_SITE_OTHER): Payer: Medicare HMO | Admitting: Internal Medicine

## 2021-07-18 ENCOUNTER — Other Ambulatory Visit: Payer: Self-pay

## 2021-07-18 VITALS — BP 130/76 | HR 58 | Temp 97.6°F | Ht 67.0 in | Wt 220.0 lb

## 2021-07-18 DIAGNOSIS — J4540 Moderate persistent asthma, uncomplicated: Secondary | ICD-10-CM

## 2021-07-18 DIAGNOSIS — G4700 Insomnia, unspecified: Secondary | ICD-10-CM | POA: Diagnosis not present

## 2021-07-18 LAB — NITRIC OXIDE: Nitric Oxide: 56

## 2021-07-18 MED ORDER — MONTELUKAST SODIUM 10 MG PO TABS: 10.0000 mg | ORAL_TABLET | Freq: Every day | ORAL | 11 refills | Status: DC

## 2021-07-18 MED ORDER — FLUTICASONE FUROATE-VILANTEROL 100-25 MCG/INH IN AEPB: 1.0000 | INHALATION_SPRAY | Freq: Every day | RESPIRATORY_TRACT | 3 refills | Status: DC

## 2021-07-18 NOTE — Assessment & Plan Note (Addendum)
-   Well controlled on low dose ICS/LABA. Symptoms flares off BREO 179mg and he was instructed to resume. PFTs today showed moderate restrictive defect with +BD response. FENO 56. ACT score 24. Recommend adding Singulair '10mg'$  at bedtime d/t nocturnal cough. [place]

## 2021-07-18 NOTE — Patient Instructions (Signed)
Pulmonary function testing and FENO confirmed asthma diagnosis Continue Breo 1 puff daily in the morning Start Singulair '10mg'$  at bedtime for asthma/allergy symptoms  Follow up with Dr. Halford Chessman in [place] as scheduled

## 2021-07-18 NOTE — Progress Notes (Signed)
Full PFT performed today. °

## 2021-07-18 NOTE — Progress Notes (Signed)
$'@Patient'J$  ID: Jared Whitaker., male    DOB: 1946/07/04, 75 y.o.   MRN: [id number]  Chief Complaint  Patient presents with   Follow-up    PFT review.     Referring provider: [name], MD  HPI: 75 year old male, never smoked (second hand exposure).  For moderate persistent asthma.  Patient started Chase Caller, last seen 06/26/2021.  Previous [facility] pulmonary encounter: 06/26/21- Dr. Boykin Peek. 75 y.o. -returns for follow-up but it is really a new consult because it has been over 3 years since I last saw him.  He tells me that he ran out of his Breo 3 months ago and because he has not been seen in over 3 years he is a new patient and would require a visit for refill of the [facility].  He also tells me that since stopping the [facility] he is noticing that when he lies down at he has postnasal drainage that gets better when he sits up.  This is forcing him to sleep sitting up.  In addition he notices some shortness of breath when he lies down when he tries to sleep.  He thinks it might be because of atrial fibrillation.  He is also gained 15 pounds in the last few years during the pandemic.  He says he has a history of sleep apnea that was tested positive for mild sleep apnea 3 years ago and was advised to take CPAP but he has not followed through on that.  He does not have a sleep specialist.  He sees Dr. [name] but he says she is not the one who is managing his sleep issues.  He does feel excessively fatigued during the daytime.  He is denying active coughing or wheezing but definitely all the symptoms are worse after stopping the Breo.    07/18/2021- Interim hx  Patient presents today for 2 to 4-week follow-up with PFTs.  Patient had been experiencing recurrance of asthma symptoms while off ICS/LABA. During his last office visit with Dr. Chase Caller he was restarted on Breo 175mg daily. He is doing well today without significant respiratory complaints. His only complaints are nocturnal cough  and insomnia. He has to clear his throat several times at night. He will being seeing a sleep specialist next week. No SABA use. Denies shortness of breath, chest tightness or wheezing.   Pulmonary function test: 07/18/2021 FVC 2.39 (65%), FEV1 1.80 (68%), ratio 75, DLCOunc 23.26 (102%) Moderate restrictive defect with positive BD response   07/18/2021 FENO- 56   Asthma Control Panel 08/24/16 - new 10/12/2016   08/14/2017   06/26/2021   07/18/2021   Current Med Regimen none breo   Off breo x 3 month On BREO 100  ACQ 5 point- 1 week. wtd avg score. <1.0 is good control 0.75-1.25 is grey zone. >1.25 poor control. Delta 0.5 is clinically meaningful   0   x x  ACT test       18 24  fev1 1.54L/54%   1.71L /61% -> 1/87L/67%     Fno 95 32   Cannot do due to lack of covid vaxx card 52  Planned intervention  for visit   Continue breo     Continue Breo, start singulair '10mg'$  at bedtime     Asthma Control Test ACT Total Score  06/26/2021 18  07/18/2021 24     Allergies  Allergen Reactions   Penicillins Rash    Purple spots, swelling    Immunization History  Administered  Date(s) Administered   Dtap, Unspecified 08/12/2020   Influenza, High Dose Seasonal PF 10/12/2016, 09/20/2018   Influenza-Unspecified 09/27/2017   Zoster Recombinat (Shingrix) 09/20/2018   Zoster, Live 11/12/2017    Past Medical History:  Diagnosis Date   Anemia    Anemia    Colon polyp    Tubular adenoma- in colonoscopy in 07/2006 followed by GI Dr Armanda Magic dermatitis    Dilated aortic root ([place])    ascending aorta 44m by echo 07/2017   Hyperlipidemia    pateint denies this   Hypertension    patient denies this   Normal cardiac stress test    Osteoarthritis    of the shoulders   Overweight    Persistent atrial fibrillation ([facility])    chads2vasc score is 1-2 (pt denies HTN)   Shortness of breath    Snoring    w/u for apnea is in progress   Stroke ([place]     Tobacco History: Social History   Tobacco Use   Smoking Status Never  Smokeless Tobacco Never  Tobacco Comments   Secondhand smoke exposure   Counseling given: Not Answered Tobacco comments: Secondhand smoke exposure   Outpatient Medications Prior to Visit  Medication Sig Dispense Refill   carboxymethylcellulose (REFRESH PLUS) 0.5 % SOLN 3 (three) times daily as needed     Cyanocobalamin (VITAMIN B 12 PO) Take by mouth. Daily     furosemide (LASIX) 20 MG tablet Take 1 tablet (20 mg total) by mouth daily as needed. Take for swelling, edema, or shortness of breath 90 tablet 3   rivaroxaban (XARELTO) 20 MG TABS tablet Take 1 tablet (20 mg total) by mouth daily with supper. 90 tablet 1   rosuvastatin (CRESTOR) 5 MG tablet Take 5 mg by mouth daily.     traZODone (DESYREL) 50 MG tablet Take by mouth.     fluticasone furoate-vilanterol (BREO ELLIPTA) 100-25 MCG/INH AEPB Inhale 1 puff into the lungs daily. 180 each 0   No facility-administered medications prior to visit.      Review of Systems  Review of Systems  Constitutional: Negative.   HENT: Negative.    Respiratory:  Positive for cough. Negative for chest tightness, shortness of breath and wheezing.   Cardiovascular: Negative.     Physical Exam  BP 130/76 (BP Location: Left Arm, Patient Position: Sitting, Cuff Size: Normal)   Pulse (!) 58   Temp 97.6 F (36.4 C) (Oral)   Ht '5\' 7"'$  (1.702 m)   Wt 220 lb (99.8 kg)   SpO2 98%   BMI 34.46 kg/m  Physical Exam Constitutional:      Appearance: Normal appearance.  HENT:     Head: Normocephalic and atraumatic.     Mouth/Throat:     Mouth: Mucous membranes are moist.     Pharynx: Oropharynx is clear.  Cardiovascular:     Rate and Rhythm: Normal rate and regular rhythm.  Pulmonary:     Effort: Pulmonary effort is normal.     Breath sounds: Normal breath sounds. No wheezing, rhonchi or rales.  Musculoskeletal:        General: Normal range of motion.  Skin:    General: Skin is warm and dry.  Neurological:      General: No focal deficit present.     Mental Status: He is alert and oriented to person, place, and time. Mental status is at baseline.  Psychiatric:        Mood and Affect: Mood normal.  Behavior: Behavior normal.        Thought Content: Thought content normal.        Judgment: Judgment normal.     Lab Results:  CBC    Component Value Date/Time   WBC 7.4 10/16/2018 1510   WBC 7.4 07/17/2016 1519   RBC 3.94 (L) 10/16/2018 1510   RBC 4.47 07/17/2016 1519   HGB 12.7 (L) 10/16/2018 1510   HGB 12.2 (L) 05/21/2016 1440   HCT 38.5 10/16/2018 1510   HCT 36.2 (L) 05/21/2016 1440   PLT 274 10/16/2018 1510   MCV 98 (H) 10/16/2018 1510   MCV 92.8 05/21/2016 1440   MCH 32.2 10/16/2018 1510   MCH 31.8 07/17/2016 1519   MCHC 33.0 10/16/2018 1510   MCHC 33.9 07/17/2016 1519   RDW 11.8 (L) 10/16/2018 1510   RDW 12.7 05/21/2016 1440   LYMPHSABS 2,368 07/17/2016 1519   LYMPHSABS 2.8 05/21/2016 1440   MONOABS 814 07/17/2016 1519   MONOABS 0.6 05/21/2016 1440   EOSABS 518 (H) 07/17/2016 1519   EOSABS 0.2 05/21/2016 1440   BASOSABS 74 07/17/2016 1519   BASOSABS 0.0 05/21/2016 1440    BMET    Component Value Date/Time   NA 139 12/08/2019 1521   NA 140 05/21/2016 1440   K 4.5 12/08/2019 1521   K 4.4 05/21/2016 1440   CL 104 12/08/2019 1521   CO2 23 12/08/2019 1521   CO2 25 05/21/2016 1440   GLUCOSE 91 12/08/2019 1521   GLUCOSE 100 (H) 07/17/2016 1519   GLUCOSE 95 05/21/2016 1440   BUN 12 12/08/2019 1521   BUN 13.4 05/21/2016 1440   CREATININE 1.12 12/08/2019 1521   CREATININE 1.11 07/17/2016 1519   CREATININE 1.1 05/21/2016 1440   CALCIUM 9.2 12/08/2019 1521   CALCIUM 9.2 05/21/2016 1440   GFRNONAA 64 12/08/2019 1521   GFRAA 74 12/08/2019 1521    BNP    Component Value Date/Time   BNP 32.6 07/17/2016 1519    ProBNP No results found for: PROBNP  Imaging: ECHOCARDIOGRAM COMPLETE  Result Date: 07/07/2021    ECHOCARDIOGRAM REPORT   Patient Name:   Lavonne Amparano. Date of Exam: 07/07/2021 Medical Rec #:  [id number]          Height:       67.5 in Accession #:    [id number]         Weight:       217.5 lb Date of Birth:  Mar 29, 1946           BSA:          2.107 m Patient Age:    54 years           BP:           152/70 mmHg Patient Gender: M                  HR:           61 bpm. Exam Location:  [place] Procedure: 2D Echo, Cardiac Doppler and Color Doppler Indications:    R06.02 SOB;  History:        Patient has prior history of Echocardiogram examinations, most                 recent 01/06/2020. Arrythmias:Atrial Fibrillation,                 Signs/Symptoms:Dyspnea; Risk Factors:Dyslipidemia and  Non-Smoker.  Sonographer:    Hester Mates BS, RVT, RDCS Referring Phys: [place]  1. Left ventricular ejection fraction, by estimation, is 60 to 65%. The left ventricle has normal function. The left ventricle has no regional wall motion abnormalities. Left ventricular diastolic parameters are indeterminate.  2. Right ventricular systolic function is normal. The right ventricular size is normal.  3. Left atrial size was mildly dilated.  4. The mitral valve is normal in structure. Moderate mitral valve regurgitation. No evidence of mitral stenosis.  5. The aortic valve is normal in structure. Aortic valve regurgitation is mild.  6. There is mild dilatation of the ascending aorta, measuring 37 mm. Comparison(s): Prior echo showed an EF of 50-55%, no RWMA, moderate biatrial enlargement and ascending aorta dilatation measuring 39 mm. FINDINGS  Left Ventricle: Left ventricular ejection fraction, by estimation, is 60 to 65%. The left ventricle has normal function. The left ventricle has no regional wall motion abnormalities. The left ventricular internal cavity size was normal in size. There is  no left ventricular hypertrophy. Left ventricular diastolic parameters are indeterminate. Right Ventricle: The right ventricular size is normal. No  increase in right ventricular wall thickness. Right ventricular systolic function is normal. Left Atrium: Left atrial size was mildly dilated. Right Atrium: Right atrial size was normal in size. Pericardium: There is no evidence of pericardial effusion. Mitral Valve: The mitral valve is normal in structure. Moderate mitral valve regurgitation. No evidence of mitral valve stenosis. Tricuspid Valve: The tricuspid valve is normal in structure. Tricuspid valve regurgitation is not demonstrated. No evidence of tricuspid stenosis. Aortic Valve: The aortic valve is normal in structure. Aortic valve regurgitation is mild. Aortic regurgitation PHT measures 653 msec. No aortic stenosis is present. Pulmonic Valve: The pulmonic valve was normal in structure. Pulmonic valve regurgitation is not visualized. No evidence of pulmonic stenosis. Aorta: The aortic root is normal in size and structure. There is mild dilatation of the ascending aorta, measuring 37 mm. Venous: The inferior vena cava is normal in size with greater than 50% respiratory variability, suggesting right atrial pressure of 3 mmHg. IAS/Shunts: No atrial level shunt detected by color flow Doppler.  LEFT VENTRICLE PLAX 2D LVIDd:         5.10 cm LVIDs:         3.40 cm LV PW:         1.30 cm LV IVS:        1.30 cm LVOT diam:     1.80 cm LV SV:         51 LV SV Index:   24 LVOT Area:     2.54 cm  LV Volumes (MOD) LV vol d, MOD A2C: 78.5 ml LV vol d, MOD A4C: 104.0 ml LV vol s, MOD A2C: 22.9 ml LV vol s, MOD A4C: 22.4 ml LV SV MOD A2C:     55.6 ml LV SV MOD A4C:     104.0 ml LV SV MOD BP:      69.5 ml RIGHT VENTRICLE RV Basal diam:  3.40 cm RV Mid diam:    2.50 cm RV S prime:     12.10 cm/s TAPSE (M-mode): 1.5 cm LEFT ATRIUM             Index       RIGHT ATRIUM           Index LA diam:        5.10 cm 2.42 cm/m  RA Area:  15.10 cm LA Vol (A2C):   77.5 ml 36.78 ml/m RA Volume:   32.40 ml  15.38 ml/m LA Vol (A4C):   50.9 ml 24.15 ml/m LA Biplane Vol: 68.0 ml 32.27  ml/m  AORTIC VALVE                   PULMONIC VALVE LVOT Vmax:         96.30 cm/s  PV Vmax:          0.84 m/s LVOT Vmean:        67.700 cm/s PV Peak grad:     2.8 mmHg LVOT VTI:          0.199 m     PR End Diast Vel: 5.20 msec AI PHT:            653 msec AR Vena Contracta: 0.60 cm  AORTA Ao Root diam: 3.70 cm Ao Asc diam:  3.70 cm TRICUSPID VALVE TR Peak grad:   33.4 mmHg TR Vmax:        289.00 cm/s  SHUNTS Systemic VTI:  0.20 m Systemic Diam: 1.80 cm Ida Rogue MD Electronically signed by Ida Rogue MD Signature Date/Time: 07/07/2021/3:47:43 PM    Final      Assessment & Plan:   Asthma, well controlled, moderate persistent - Well controlled on low dose ICS/LABA. Symptoms flares off BREO 119mg and he was instructed to resume. PFTs today showed moderate restrictive defect with +BD response. FENO 56. ACT score 24. Recommend adding Singulair '10mg'$  at bedtime d/t nocturnal cough. FU in [place].   Insomnia - Patient is scheduled for a sleep consult at the end of the month with Dr. SAlmedia Balls NP 07/18/2021

## 2021-07-18 NOTE — Patient Instructions (Signed)
Full PFT performed today. °

## 2021-07-18 NOTE — Assessment & Plan Note (Addendum)
-   Patient is scheduled for a sleep consult at the end of the month with Dr. Halford Chessman

## 2021-07-24 LAB — PULMONARY FUNCTION TEST
DL/VA % pred: 133 %
DL/VA: 5.34 ml/min/mmHg/L
DLCO cor % pred: 102 %
DLCO cor: 23.26 ml/min/mmHg
DLCO unc % pred: 102 %
DLCO unc: 23.26 ml/min/mmHg
FEF 25-75 Post: 1.86 L/sec
FEF 25-75 Pre: 0.95 L/sec
FEF2575-%Change-Post: 96 %
FEF2575-%Pred-Post: 100 %
FEF2575-%Pred-Pre: 51 %
FEV1-%Change-Post: 23 %
FEV1-%Pred-Post: 68 %
FEV1-%Pred-Pre: 55 %
FEV1-Post: 1.8 L
FEV1-Pre: 1.46 L
FEV1FVC-%Change-Post: 7 %
FEV1FVC-%Pred-Pre: 97 %
FEV6-%Change-Post: 15 %
FEV6-%Pred-Post: 70 %
FEV6-%Pred-Pre: 60 %
FEV6-Post: 2.39 L
FEV6-Pre: 2.07 L
FEV6FVC-%Pred-Post: 107 %
FEV6FVC-%Pred-Pre: 107 %
FVC-%Change-Post: 15 %
FVC-%Pred-Post: 65 %
FVC-%Pred-Pre: 56 %
FVC-Post: 2.39 L
FVC-Pre: 2.07 L
Post FEV1/FVC ratio: 75 %
Post FEV6/FVC ratio: 100 %
Pre FEV1/FVC ratio: 70 %
Pre FEV6/FVC Ratio: 100 %
RV % pred: 112 %
RV: 2.74 L
TLC % pred: 79 %
TLC: 5.14 L

## 2021-08-03 ENCOUNTER — Encounter: Payer: Self-pay | Admitting: Pulmonary Disease

## 2021-08-03 ENCOUNTER — Other Ambulatory Visit: Payer: Self-pay

## 2021-08-03 ENCOUNTER — Ambulatory Visit: Payer: Medicare HMO | Admitting: Pulmonary Disease

## 2021-08-03 VITALS — BP 140/70 | HR 43 | Temp 98.5°F | Ht 67.5 in | Wt 216.4 lb

## 2021-08-03 DIAGNOSIS — J342 Deviated nasal septum: Secondary | ICD-10-CM | POA: Diagnosis not present

## 2021-08-03 DIAGNOSIS — G4733 Obstructive sleep apnea (adult) (pediatric): Secondary | ICD-10-CM

## 2021-08-03 DIAGNOSIS — J4540 Moderate persistent asthma, uncomplicated: Secondary | ICD-10-CM | POA: Diagnosis not present

## 2021-08-03 DIAGNOSIS — J301 Allergic rhinitis due to pollen: Secondary | ICD-10-CM | POA: Diagnosis not present

## 2021-08-03 DIAGNOSIS — J449 Chronic obstructive pulmonary disease, unspecified: Secondary | ICD-10-CM

## 2021-08-03 MED ORDER — FLUTICASONE PROPIONATE 50 MCG/ACT NA SUSP: 1.0000 | Freq: Every day | NASAL | 2 refills | Status: DC

## 2021-08-03 MED ORDER — AZELASTINE HCL 0.1 % NA SOLN: 1.0000 | Freq: Two times a day (BID) | NASAL | 12 refills | Status: DC

## 2021-08-03 NOTE — Patient Instructions (Signed)
Uses nasal irrigation (saline nasal rinse) daily Flonase one spray in each nostril daily Azelastine one spray in each nostril in the morning and in the evening Continue using breo one puff daily and montelukast 10 mg pill nightly  Follow up in with Dr. Halford Chessman or Nurse Practitioner in [place] office in 6 weeks

## 2021-08-03 NOTE — Progress Notes (Signed)
[facility]  Chief Complaint  Patient presents with   Follow-up    Eval for Sleep Apnea    Constitutional:  BP 140/70 (BP Location: Left Arm, Patient Position: Sitting, Cuff Size: Normal)   Pulse (!) 43   Temp 98.5 F (36.9 C) (Oral)   Ht 5' 7.5" (1.715 m)   Wt 216 lb 6.4 oz (98.2 kg)   SpO2 99%   BMI 33.39 kg/m   Past Medical History:  Anemia, Colon polyps, Contact dermatitis, Dilated aortic root, OA, Atrial fibrillation, CVA  Past Surgical History:  He  has a past surgical history that includes Colonoscopy; left leg fracture (1969); Cardioversion (N/A, 03/03/2014); and Cardioversion (N/A, 01/31/2015).  Brief Summary:  Jared Whitaker. is a 75 y.o. male with allergic asthma.      Subjective:   He has been followed by Dr. Chase Caller, but would like to switch to pulmonary provider in [place].  He was seen by Dr. Rexene Alberts in 2021 with [facility].  He had a home sleep study in 2021 that showed mild sleep apnea.  He opted to defer therapy for this since he felt that most of his breathing issues at night were related to his allergies and asthma.  He was started on singulair recently.  He thought this was a sleeping medicine because it improved his breathing at night and then helped him sleep.  He was getting about 7 hours sleep per night until about 2 weeks ago.  He started getting more sinus congestion and post nasal drip.    He has trazodone in his medication list, but hasn't been using this.  He did not find this very effective.  Physical Exam:   Appearance - well kempt   ENMT - no sinus tenderness, no oral exudate, no LAN, Mallampati 3 airway, no stridor, scalloped tongue, deviated nasal septum, clear nasal drainage  Respiratory - equal breath sounds bilaterally, no wheezing or rales  CV - s1s2 regular rate and rhythm, no murmurs  Ext - no clubbing, no edema  Skin - no rashes  Psych - normal mood and affect    Pulmonary testing:  PFT 07/18/21 >> AHI 1.80 (68%), FEV1% 75, TLC 5.14 (79%), DLCO 102%, +BD  Chest Imaging:    Sleep Tests:  HST 01/11/20 >> AHI 7.3, SpO2 low 91%  Cardiac Tests:  Echo 07/07/21 >> EF 60 to 65%, mild LA dilation, mod MR, aortic root 37 mm  Social History:  He  reports that he has never smoked. He has never used smokeless tobacco. He reports current alcohol use of about 1.0 - 2.0 standard drink per week. He reports that he does not use drugs.  Family History:  His family history includes Bladder Cancer in his father; CVA in his mother; Cancer in his paternal uncle; Heart attack in his father and mother.     Assessment/Plan:   Allergic asthma. - continue breo and prn albuterol - continue singulair - reviewed his PFT results with him  Allergic rhinitis with deviated nasal septum. - this seems to be main issue at present, and seems to be what is causing his sleep disruption at present - will have him use nasal irrigation, flonase, and azelastine - continue singulair - might need additional allergy testing if symptoms persist  History of obstructive sleep apnea. - discussed his sleep study results with showed mild sleep apnea - he would prefer to address allergies and asthma first, and then determine if he would  consider therapy for sleep apnea - I have removed trazodone from his medication list since he hasn't been using this  Permanent atrial fibrillation, Thoracic aortic aneurysm, Mitral regurgitation. - followed by Dr. Harrell Gave End with [facility]  Time Spent Involved in Patient Care on Day of Examination:  42 minutes  Follow up:   Patient Instructions  Uses nasal irrigation (saline nasal rinse) daily Flonase one spray in each nostril daily Azelastine one spray in each nostril in the morning and in the evening Continue using breo one puff daily and montelukast 10 mg pill nightly  Follow up in with Dr. Halford Chessman or Nurse Practitioner in [place]  office in 6 weeks  Medication List:   Allergies as of 08/03/2021       Reactions   Penicillins Rash   Purple spots, swelling        Medication List        Accurate as of August 03, 2021 12:02 PM. If you have any questions, ask your nurse or doctor.          STOP taking these medications    traZODone 50 MG tablet Commonly known as: DESYREL Stopped by: Chesley Mires, MD       TAKE these medications    azelastine 0.1 % nasal spray Commonly known as: ASTELIN Place 1 spray into both nostrils 2 (two) times daily. Use in each nostril as directed Started by: Chesley Mires, MD   carboxymethylcellulose 0.5 % Soln Commonly known as: REFRESH PLUS 3 (three) times daily as needed   fluticasone 50 MCG/ACT nasal spray Commonly known as: FLONASE Place 1 spray into both nostrils daily. Started by: Chesley Mires, MD   fluticasone furoate-vilanterol 100-25 MCG/INH Aepb Commonly known as: Breo Ellipta Inhale 1 puff into the lungs daily.   furosemide 20 MG tablet Commonly known as: LASIX Take 1 tablet (20 mg total) by mouth daily as needed. Take for swelling, edema, or shortness of breath   montelukast 10 MG tablet Commonly known as: SINGULAIR Take 1 tablet (10 mg total) by mouth at bedtime.   rivaroxaban 20 MG Tabs tablet Commonly known as: Xarelto Take 1 tablet (20 mg total) by mouth daily with supper.   rosuvastatin 5 MG tablet Commonly known as: CRESTOR Take 5 mg by mouth daily.   VITAMIN B 12 PO Take by mouth. Daily        Signature:  Chesley Mires, MD [place] Pager - [phone] 08/03/2021, 12:02 PM

## 2021-09-06 ENCOUNTER — Ambulatory Visit: Payer: Medicare HMO | Admitting: Internal Medicine

## 2021-09-06 NOTE — Progress Notes (Deleted)
Follow-up Outpatient Visit Date: 09/06/2021  Primary Care Provider: Leonel Ramsay, MD [place] [address]  Chief Complaint: ***  HPI:  Mr. Jared Whitaker is a 75 y.o. male with history of permanent atrial fibrillation (failed flecainide in the past), RBBB, mild-moderate carotid artery stenosis, dilated ascending aorta/aortic root, asthma, and mild OSA (declined CPAP), who presents for follow-up of atrial fibrillation and vascular disease.  I met him in late August, as he wished to transfer his care from Dr. [name] to our office.  At that time, he was most concerned about difficulty sleeping.  He also reported progressive exertional dyspnea over 6-8 months, which he attributed to stopping his asthma medications.  We agreed to repeat an echo, which showed normal LVEF with indeterminate diastolic parameters as well as moderate mitral regurgitation.  Ascending aorta was mildly dilated.  --------------------------------------------------------------------------------------------------  Past Medical History:  Diagnosis Date   Anemia    Anemia    Colon polyp    Tubular adenoma- in colonoscopy in 07/2006 followed by GI Dr Armanda Magic dermatitis    Dilated aortic root ([place])    ascending aorta 89m by echo 07/2017   Hyperlipidemia    pateint denies this   Hypertension    patient denies this   Normal cardiac stress test    Osteoarthritis    of the shoulders   Overweight    [place]    chads2vasc score is 1-2 (pt denies HTN)   Shortness of breath    Snoring    w/u for apnea is in progress   Stroke ([facility]    Past Surgical History:  Procedure Laterality Date   CARDIOVERSION N/A 03/03/2014   Procedure: CARDIOVERSION;  Surgeon: JCarlena Bjornstad MD;  [place] [facility]  Service: Cardiovascular;  Laterality: N/A;   CARDIOVERSION N/A 01/31/2015   Procedure: CARDIOVERSION;  Surgeon: JCarlena Bjornstad MD;  [place] [facility]  Service:  Cardiovascular;  Laterality: N/A;   COLONOSCOPY     left leg fracture  1969    No outpatient medications have been marked as taking for the 09/06/21 encounter (Appointment) with Lataysha Vohra, CHarrell Gave MD.    Allergies: Penicillins  Social History   Tobacco Use   Smoking status: Never   Smokeless tobacco: Never   Tobacco comments:    Secondhand smoke exposure  Vaping Use   Vaping Use: Never used  Substance Use Topics   Alcohol use: Yes    Alcohol/week: 1.0 - 2.0 standard drink    Types: 1 - 2 Cans of beer per week    Comment: 3 six packs a week   Drug use: No    Family History  Problem Relation Age of Onset   CVA Mother    Heart attack Mother    Heart attack Father        smoker   Bladder Cancer Father    Cancer Paternal Uncle     Review of Systems: A 12-system review of systems was performed and was negative except as noted in the HPI.  --------------------------------------------------------------------------------------------------  Physical Exam: There were no vitals taken for this visit.  General:  NAD. Neck: No JVD or HJR. Lungs: Clear to auscultation bilaterally without wheezes or crackles. Heart: Regular rate and rhythm without murmurs, rubs, or gallops. Abdomen: Soft, nontender, nondistended. Extremities: No lower extremity edema.  EKG:  ***  Lab Results  Component Value Date   WBC 7.4 10/16/2018   HGB 12.7 (L) 10/16/2018   HCT  38.5 10/16/2018   MCV 98 (H) 10/16/2018   PLT 274 10/16/2018    Lab Results  Component Value Date   NA 139 12/08/2019   K 4.5 12/08/2019   CL 104 12/08/2019   CO2 23 12/08/2019   BUN 12 12/08/2019   CREATININE 1.12 12/08/2019   GLUCOSE 91 12/08/2019   ALT 17 12/08/2019    No results found for: CHOL, HDL, LDLCALC, LDLDIRECT, TRIG, CHOLHDL  --------------------------------------------------------------------------------------------------  ASSESSMENT AND PLAN: ***  Nelva Bush, MD 09/06/2021 7:48 AM

## 2021-09-20 ENCOUNTER — Other Ambulatory Visit: Payer: Self-pay

## 2021-09-20 ENCOUNTER — Ambulatory Visit: Payer: Medicare HMO | Admitting: Pulmonary Disease

## 2021-09-20 ENCOUNTER — Encounter: Payer: Self-pay | Admitting: Pulmonary Disease

## 2021-09-20 VITALS — BP 130/78 | HR 67 | Temp 97.1°F | Ht 67.5 in | Wt 214.6 lb

## 2021-09-20 DIAGNOSIS — J4540 Moderate persistent asthma, uncomplicated: Secondary | ICD-10-CM | POA: Diagnosis not present

## 2021-09-20 DIAGNOSIS — J342 Deviated nasal septum: Secondary | ICD-10-CM

## 2021-09-20 DIAGNOSIS — J301 Allergic rhinitis due to pollen: Secondary | ICD-10-CM | POA: Diagnosis not present

## 2021-09-20 MED ORDER — ALBUTEROL SULFATE HFA 108 (90 BASE) MCG/ACT IN AERS: 2.0000 | INHALATION_SPRAY | Freq: Four times a day (QID) | RESPIRATORY_TRACT | 5 refills | Status: DC | PRN

## 2021-09-20 NOTE — Progress Notes (Signed)
[facility]  Chief Complaint  Patient presents with   Follow-up    Constitutional:  BP 130/78 (BP Location: Left Arm, Patient Position: Sitting, Cuff Size: Normal)   Pulse 67   Temp (!) 97.1 F (36.2 C) (Oral)   Ht 5' 7.5" (1.715 m)   Wt 214 lb 9.6 oz (97.3 kg)   SpO2 98%   BMI 33.12 kg/m   Past Medical History:  Anemia, Colon polyps, Contact dermatitis, Dilated aortic root, OA, Atrial fibrillation, CVA, Obstructive sleep apnea  Past Surgical History:  He  has a past surgical history that includes Colonoscopy; left leg fracture (1969); Cardioversion (N/A, 03/03/2014); and Cardioversion (N/A, 01/31/2015).  Brief Summary:  Jared Whitaker. is a 75 y.o. male with allergic asthma and rhinitis.      Subjective:   Breathing is better.  Not having as much sinus congestion or nasal drip.  Still has globus sensation.  Has to clear his throat frequently.  Tries to cough, but doesn't bring up phlegm usually.  Not having wheeze or chest tightness.  He has been sleeping better.  Only issue with sleep now happens when he has an activity coming up, and then he thinks about it in bed.  Physical Exam:   Appearance - well kempt   ENMT - no sinus tenderness, no oral exudate, no LAN, Mallampati 3 airway, no stridor, deviated septum  Respiratory - equal breath sounds bilaterally, no wheezing or rales  CV - s1s2 regular rate and rhythm, no murmurs  Ext - no clubbing, no edema  Skin - no rashes  Psych - normal mood and affect   Pulmonary testing:  PFT 07/18/21 >> AHI 1.80 (68%), FEV1% 75, TLC 5.14 (79%), DLCO 102%, +BD  Sleep Tests:  HST 01/11/20 >> AHI 7.3, SpO2 low 91%  Cardiac Tests:  Echo 07/07/21 >> EF 60 to 65%, mild LA dilation, mod MR, aortic root 37 mm  Social History:  He  reports that he has never smoked. He has never used smokeless tobacco. He reports current alcohol use of about 1.0 - 2.0 standard drink per week. He reports  that he does not use drugs.  Family History:  His family history includes Bladder Cancer in his father; CVA in his mother; Cancer in his paternal uncle; Heart attack in his father and mother.     Assessment/Plan:   Allergic asthma. - continue breo, singulair - prn albuterol - could step down therapy if he is doing well at next visit  Allergic rhinitis with deviated nasal septum. - continue singulair, nasal irrigation, flonase, azelastine  Upper airway cough with globus sensation. - Avoid forcing a cough or clearing your throat - Sip water when you have the urge to cough - Use sugarless candy to keep your mouth moist - Salt water gargles (1 cup of lukewarm water with 1/2 teaspoon of salt) once or twice per day - 1 teaspoon of local honey once or twice per day as needed to help alleviate cough and throat irritation  Permanent atrial fibrillation, Thoracic aortic aneurysm, Mitral regurgitation. - followed by Dr. Harrell Gave End with [facility]  Time Spent Involved in Patient Care on Day of Examination:  32 minutes  Follow up:   Patient Instructions  Avoid forcing a cough or clearing your throat Sip water when you have the urge to cough Use sugarless candy to keep your mouth moist Salt water gargles (1 cup of lukewarm water with 1/2 teaspoon of salt)  once or twice per day 1 teaspoon of local honey once or twice per day as needed to help alleviate cough and throat irritation Follow up in 6 months  Medication List:   Allergies as of 09/20/2021       Reactions   Penicillins Rash   Purple spots, swelling        Medication List        Accurate as of September 20, 2021 12:04 PM. If you have any questions, ask your nurse or doctor.          albuterol 108 (90 Base) MCG/ACT inhaler Commonly known as: Ventolin HFA Inhale 2 puffs into the lungs every 6 (six) hours as needed for wheezing or shortness of breath. Started by: Chesley Mires, MD   azelastine 0.1 % nasal  spray Commonly known as: ASTELIN Place 1 spray into both nostrils 2 (two) times daily. Use in each nostril as directed   carboxymethylcellulose 0.5 % Soln Commonly known as: REFRESH PLUS 3 (three) times daily as needed   fluticasone 50 MCG/ACT nasal spray Commonly known as: FLONASE Place 1 spray into both nostrils daily.   fluticasone furoate-vilanterol 100-25 MCG/INH Aepb Commonly known as: Breo Ellipta Inhale 1 puff into the lungs daily.   furosemide 20 MG tablet Commonly known as: LASIX Take 1 tablet (20 mg total) by mouth daily as needed. Take for swelling, edema, or shortness of breath   montelukast 10 MG tablet Commonly known as: SINGULAIR Take 1 tablet (10 mg total) by mouth at bedtime.   rivaroxaban 20 MG Tabs tablet Commonly known as: Xarelto Take 1 tablet (20 mg total) by mouth daily with supper.   rosuvastatin 5 MG tablet Commonly known as: CRESTOR Take 5 mg by mouth daily.   VITAMIN B 12 PO Take by mouth. Daily        Signature:  Chesley Mires, MD [place] Pager - [phone] 09/20/2021, 12:04 PM

## 2021-09-20 NOTE — Patient Instructions (Signed)
Avoid forcing a cough or clearing your throat Sip water when you have the urge to cough Use sugarless candy to keep your mouth moist Salt water gargles (1 cup of lukewarm water with 1/2 teaspoon of salt) once or twice per day 1 teaspoon of local honey once or twice per day as needed to help alleviate cough and throat irritation Follow up in 6 months

## 2021-09-25 DIAGNOSIS — G4700 Insomnia, unspecified: Secondary | ICD-10-CM | POA: Diagnosis not present

## 2021-09-25 DIAGNOSIS — D649 Anemia, unspecified: Secondary | ICD-10-CM | POA: Diagnosis not present

## 2021-09-25 DIAGNOSIS — E785 Hyperlipidemia, unspecified: Secondary | ICD-10-CM | POA: Diagnosis not present

## 2021-09-25 DIAGNOSIS — Z87891 Personal history of nicotine dependence: Secondary | ICD-10-CM | POA: Diagnosis not present

## 2021-09-25 DIAGNOSIS — Z86010 Personal history of colonic polyps: Secondary | ICD-10-CM | POA: Diagnosis not present

## 2021-09-25 DIAGNOSIS — Z0000 Encounter for general adult medical examination without abnormal findings: Secondary | ICD-10-CM | POA: Diagnosis not present

## 2021-09-25 DIAGNOSIS — I4891 Unspecified atrial fibrillation: Secondary | ICD-10-CM | POA: Diagnosis not present

## 2021-09-25 DIAGNOSIS — J45909 Unspecified asthma, uncomplicated: Secondary | ICD-10-CM | POA: Diagnosis not present

## 2021-10-02 NOTE — Progress Notes (Signed)
Cardiology Office Note    Date:  10/03/2021   ID:  Jared Whitaker., DOB 28-Feb-1946, MRN [id number]  PCP:  Leonel Ramsay, MD  Cardiologist:  [name], MD  Electrophysiologist:  None   Chief Complaint: Follow-up  History of Present Illness:   Jared Whitaker. is a 75 y.o. male with history of permanent A. 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  He has previously been followed by Dr. [name] in our [facility] office in [place], though has more recently transitioned to Dr. Saunders Revel in our [place] office.  With regards to his A. fib, he has been followed by Dr. [name] and has previously failed flecainide and declined further rhythm control strategies.  He has preferred rate control due to lack of symptoms.  Nuclear stress test in 2017 was low risk with normal perfusion and an LVEF of 57%.  Echo in 07/2017 showed an EF of 55 to 60%, mild LVH, mildly dilated ascending aorta measuring 40 mm and a mildly dilated aortic root measuring 37 mm.  He established care with Dr. Saunders Revel in 06/2021, and at that time was most concerned about difficulty sleeping.  He also reported feeling short of breath with activities such as taking out his trash.  Previously, he had been able to walk up to 2 miles per day, though this was limited following a knee injury.  He was also noted he stopped taking his asthma medications in the spring 2022.  It was noted CPAP had previously been recommended, though he preferred to defer this.  He had chronic mild lower extremity edema that has been reasonably controlled with furosemide.  With regards to his A. fib at his visit with Dr. Saunders Revel, he did inquire about potential rhythm control strategies and wondered if his exertional dyspnea may have been a manifestation of his A. fib.  It was  felt it would be difficult to maintain sinus rhythm given his longstanding A. fib.  His dyspnea was ultimately felt to be multifactorial including underlying cardiac and pulmonary disease.  Echo was recommended and demonstrated an EF of 60 to 65%, no regional wall motion abnormalities, indeterminate LV diastolic function parameters, normal RV systolic function and ventricular cavity size, mildly dilated left atrium measuring 51 mm, moderate mitral valve regurgitation, mild aortic valve insufficiency, and a mildly dilated ascending thoracic aorta measuring 37 mm.  He comes in doing reasonably well from a cardiac perspective.  He continues to note intermittent exertional dyspnea, though at times he is without significant dyspnea.  No chest pain, palpitations, dizziness, presyncope, or syncope.  No lower extremity swelling, orthopnea, falls, hematochezia, or melena.  His weight remains stable.  He remains interested in pursuing possible rhythm control strategies regarding his A. fib.  He is not aware of any further discussion regarding potential sleep apnea.   Labs independently reviewed: 09/2021 - potassium 4.8, BUN 16, serum creatinine 1.3, Hgb 13.2, PLT 275 05/2021 - TC 152, TG 145, HDL 50, LDL 72, albumin 4.1, AST/ALT normal 07/2016 - TSH normal  Past Medical History:  Diagnosis Date   Anemia    Anemia    Colon polyp    Tubular adenoma- in colonoscopy in 07/2006 followed by GI Dr Armanda Magic dermatitis    Dilated aortic root ([place])    ascending aorta 75mm by  echo 07/2017   Hyperlipidemia    pateint denies this   Hypertension    patient denies this   Normal cardiac stress test    Osteoarthritis    of the shoulders   Overweight    Persistent atrial fibrillation (HCC)    chads2vasc score is 1-2 (pt denies HTN)   Shortness of breath    Snoring    w/u for apnea is in progress   Stroke [facility])     Past Surgical History:  Procedure Laterality Date   CARDIOVERSION N/A 03/03/2014    Procedure: CARDIOVERSION;  Surgeon: Carlena Bjornstad, MD;  Location: [place];  Service: Cardiovascular;  Laterality: N/A;   CARDIOVERSION N/A 01/31/2015   Procedure: CARDIOVERSION;  Surgeon: Carlena Bjornstad, MD;  Location: [facility] ENDOSCOPY;  Service: Cardiovascular;  Laterality: N/A;   COLONOSCOPY     left leg fracture  1969    Current Medications: Current Meds  Medication Sig   albuterol (VENTOLIN HFA) 108 (90 Base) MCG/ACT inhaler Inhale 2 puffs into the lungs every 6 (six) hours as needed for wheezing or shortness of breath.   azelastine (ASTELIN) 0.1 % nasal spray Place 1 spray into both nostrils 2 (two) times daily. Use in each nostril as directed   carboxymethylcellulose (REFRESH PLUS) 0.5 % SOLN 3 (three) times daily as needed   Cyanocobalamin (VITAMIN B 12 PO) Take by mouth. Daily   fluticasone (FLONASE) 50 MCG/ACT nasal spray Place 1 spray into both nostrils daily.   fluticasone furoate-vilanterol (BREO ELLIPTA) 100-25 MCG/INH AEPB Inhale 1 puff into the lungs daily.   furosemide (LASIX) 20 MG tablet Take 1 tablet (20 mg total) by mouth daily as needed. Take for swelling, edema, or shortness of breath   montelukast (SINGULAIR) 10 MG tablet Take 1 tablet (10 mg total) by mouth at bedtime.   rivaroxaban (XARELTO) 20 MG TABS tablet Take 1 tablet (20 mg total) by mouth daily with supper.   rosuvastatin (CRESTOR) 5 MG tablet Take 5 mg by mouth daily.    Allergies:   Penicillins   Social History   Socioeconomic History   Marital status: Single    Spouse name: Not on file   Number of children: Not on file   Years of education: Not on file   Highest education level: Not on file  Occupational History   Not on file  Tobacco Use   Smoking status: Never   Smokeless tobacco: Never   Tobacco comments:    Secondhand smoke exposure  Vaping Use   Vaping Use: Never used  Substance and Sexual Activity   Alcohol use: Yes    Alcohol/week: 1.0 - 2.0 standard drink    Types: 1 - 2 Cans of  beer per week    Comment: 3 six packs a week   Drug use: No   Sexual activity: Not on file  Other Topics Concern   Not on file  Social History Narrative   Lives in [place].  Works an a Nurse, adult.   Social Determinants of Health   Financial Resource Strain: Not on file  Food Insecurity: Not on file  Transportation Needs: Not on file  Physical Activity: Not on file  Stress: Not on file  Social Connections: Not on file     Family History:  The patient's family history includes Bladder Cancer in his father; CVA in his mother; Cancer in his paternal uncle; Heart attack in his father and mother.  ROS:   Review of Systems  Constitutional:  Positive for malaise/fatigue. Negative for chills, diaphoresis, fever and weight loss.  HENT:  Negative for congestion.   Eyes:  Negative for discharge and redness.  Respiratory:  Positive for shortness of breath. Negative for cough, sputum production and wheezing.   Cardiovascular:  Negative for chest pain, palpitations, orthopnea, claudication, leg swelling and PND.  Gastrointestinal:  Negative for abdominal pain, blood in stool, heartburn, melena, nausea and vomiting.  Musculoskeletal:  Negative for falls and myalgias.  Skin:  Negative for rash.  Neurological:  Negative for dizziness, tingling, tremors, sensory change, speech change, focal weakness, loss of consciousness and weakness.  Endo/Heme/Allergies:  Does not bruise/bleed easily.  Psychiatric/Behavioral:  Negative for substance abuse. The patient is not nervous/anxious.   All other systems reviewed and are negative.   EKGs/Labs/Other Studies Reviewed:    Studies reviewed were summarized above. The additional studies were reviewed today:  2D echo 06/2021: 1. Left ventricular ejection fraction, by estimation, is 60 to 65%. The  left ventricle has normal function. The left ventricle has no regional  wall motion abnormalities. Left ventricular diastolic parameters are   indeterminate.   2. Right ventricular systolic function is normal. The right ventricular  size is normal.   3. Left atrial size was mildly dilated.   4. The mitral valve is normal in structure. Moderate mitral valve  regurgitation. No evidence of mitral stenosis.   5. The aortic valve is normal in structure. Aortic valve regurgitation is  mild.   6. There is mild dilatation of the ascending aorta, measuring 37 mm.   Comparison(s): Prior echo showed an EF of 50-55%, no RWMA, moderate  biatrial enlargement and ascending aorta dilatation measuring 39 mm.  __________  2D echo 12/2019:  1. Left ventricular ejection fraction, by estimation, is 50 to 55%. The  left ventricle has low normal function. The left ventricle has no regional  wall motion abnormalities. Left ventricular diastolic function could not  be evaluated. Left ventricular  diastolic function could not be evaluated.   2. Right ventricular systolic function is normal. The right ventricular  size is normal. There is mildly elevated pulmonary artery systolic  pressure.   3. Left atrial size was moderately dilated.   4. Right atrial size was moderately dilated.   5. The mitral valve is normal in structure and function. Trivial mitral  valve regurgitation. No evidence of mitral stenosis.   6. The aortic valve is tricuspid. Aortic valve regurgitation is mild.  Mild to moderate aortic valve sclerosis/calcification is present, without  any evidence of aortic stenosis.   7. Aortic dilatation noted. There is mild dilatation of the ascending  aorta measuring 39 mm.  __________  2D echo 07/2018: - Left ventricle: The cavity size was normal. Wall thickness was    increased in a pattern of mild LVH. Systolic function was normal.    The estimated ejection fraction was in the range of 60% to 65%.    The study is not technically sufficient to allow evaluation of LV    diastolic function.  - Aortic valve: There was trivial  regurgitation.  - Left atrium: The atrium was moderately dilated.  - Right atrium: The atrium was mildly dilated.  - Atrial septum: No defect or patent foramen ovale was identified. __________  2D echo 07/2017: - Left ventricle: The cavity size was normal. Wall thickness was    increased in a pattern of mild LVH. Indeterminant diastolic    function (atrial fibrillation). Systolic function was normal. The  estimated ejection fraction was in the range of 55% to 60%. Wall    motion was normal; there were no regional wall motion    abnormalities.  - Aortic valve: There was no stenosis. There was trivial    regurgitation.  - Aorta: Mildly dilated ascending aorta and aortic root. Aortic    root dimension: 37 mm (ED). Ascending aortic diameter: 40 mm (S).  - Mitral valve: Mildly calcified annulus. There was trivial    regurgitation.  - Left atrium: The atrium was severely dilated.  - Right ventricle: The cavity size was normal. Systolic function    was normal.  - Right atrium: The atrium was moderately dilated.  - Pulmonary arteries: No complete TR doppler jet so unable to    estimate PA systolic pressure.  - Inferior vena cava: The vessel was normal in size. The    respirophasic diameter changes were in the normal range (>= 50%),    consistent with normal central venous pressure.   Impressions:   - The patient was in atrial fibrillation. Normal LV size with mild    LV hypertrophy. EF 55-60%. Mildly dilated ascending aorta. Normal    RV size and systolic function. Biatrial enlargement. __________  2D echo 07/2016: - Left ventricle: The cavity size was normal. Wall thickness was    normal. Systolic function was normal. The estimated ejection    fraction was in the range of 55% to 60%. Wall motion was normal;    there were no regional wall motion abnormalities.  - Aortic valve: There was mild regurgitation.  - Ascending aorta: The ascending aorta was mildly dilated.  - Mitral valve:  There was mild regurgitation.  - Left atrium: The atrium was moderately dilated.  - Right atrium: The atrium was moderately dilated.   Impressions:   - Normal LV systolic function; moderate biatrial enlargement; mild    AI and MR; Trace TR.  __________  [facility] MPI 07/2016: Nuclear stress EF: 57%. There was no ST segment deviation noted during stress. The study is normal. This is a low risk study.   Low risk stress nuclear study with normal perfusion and normal left ventricular regional and global systolic function. __________  2D echo 01/2014: - Left ventricle: Systolic function was normal. The    estimated ejection fraction was in the range of 55% to    60%.  - Mitral valve: Mild regurgitation.  - Left atrium: The atrium was mildly dilated.  - Atrial septum: No defect or patent foramen ovale was    identified.     EKG:  EKG is not ordered today.    Recent Labs: No results found for requested labs within last 8760 hours.  Recent Lipid Panel No results found for: CHOL, TRIG, HDL, CHOLHDL, VLDL, LDLCALC, LDLDIRECT  PHYSICAL EXAM:    VS:  BP 120/70 (BP Location: Left Arm, Patient Position: Sitting, Cuff Size: Normal)   Pulse 94   Ht 5' 7.5" (1.715 m)   Wt 218 lb (98.9 kg)   SpO2 97%   BMI 33.64 kg/m   BMI: Body mass index is 33.64 kg/m.  Physical Exam Vitals reviewed.  Constitutional:      Appearance: He is well-developed.  HENT:     Head: Normocephalic and atraumatic.  Eyes:     General:        Right eye: No discharge.        Left eye: No discharge.  Neck:     Vascular: No JVD.  Cardiovascular:  Rate and Rhythm: Normal rate. Rhythm irregularly irregular.     Pulses:          Posterior tibial pulses are 2+ on the right side and 2+ on the left side.     Heart sounds: S1 normal and S2 normal. Heart sounds not distant. No midsystolic click and no opening snap. Murmur heard.  High-pitched blowing holosystolic murmur is present with a grade of 1/6 at the  apex.    No friction rub.  Pulmonary:     Effort: Pulmonary effort is normal. No respiratory distress.     Breath sounds: Normal breath sounds. No decreased breath sounds, wheezing or rales.  Chest:     Chest wall: No tenderness.  Abdominal:     General: There is no distension.     Palpations: Abdomen is soft.     Tenderness: There is no abdominal tenderness.  Musculoskeletal:     Cervical back: Normal range of motion.     Right lower leg: No edema.     Left lower leg: No edema.  Skin:    General: Skin is warm and dry.     Nails: There is no clubbing.  Neurological:     Mental Status: He is alert and oriented to person, place, and time.  Psychiatric:        Speech: Speech normal.        Behavior: Behavior normal.        Thought Content: Thought content normal.        Judgment: Judgment normal.    Wt Readings from Last 3 Encounters:  10/03/21 218 lb (98.9 kg)  09/20/21 214 lb 9.6 oz (97.3 kg)  08/03/21 216 lb 6.4 oz (98.2 kg)     ASSESSMENT & PLAN:   Permanent A. fib: He remains in A. fib with reasonably controlled ventricular response without AV nodal blocking medications due to history of slow ventricular response.  He remains interested in pursuing rhythm control strategy regarding his A. fib.  Upon reviewing EKGs in [facility], his last documented episode of being in sinus rhythm was on 02/18/2015.  I do not think his progressive dyspnea can be solely explained by his permanent A. fib given he was previously asymptomatic with this rhythm.  However, we will refer him to EP for formal consult regarding potential rhythm control strategy.  Given a CHA2DS2-VASc at least 3 (age x2, vascular disease), he remains on Xarelto without symptoms concerning for bleeding and recent stable hemoglobin.  Exertional dyspnea: Likely multifactorial in etiology including cardiac and pulmonary.  Cannot exclude some degree of physical deconditioning as well.  Schedule Lexiscan MPI to evaluate for high risk  ischemia.  Thoracic aortic aneurysm: Stable on echo obtained in 06/2021.  Follow-up with echo next year.  If there is significant change in his aortic diameter would consider cross-sectional imaging.  Mitral regurgitation: Moderate on echo in 06/2021.  Follow-up periodic echo.  History of elevated BP without diagnosis of hypertension: Blood pressure is well controlled in the office today.  Not currently requiring a standing antihypertensive therapy.  HLD: LDL 72 in 05/2021.  He remains on rosuvastatin.  Carotid artery disease: Most recent noninvasive imaging in 05/2021 showed improved right-sided ICA stenosis and stable left-sided ICA stenosis.  On Xarelto in place of aspirin to minimize bleeding risk.  He remains on rosuvastatin.  Followed by vascular surgery.  Asthma: Followed by pulmonology.   Shared Decision Making/Informed Consent{  The risks [chest pain, shortness of breath, cardiac arrhythmias, dizziness, blood  pressure fluctuations, myocardial infarction, stroke/transient ischemic attack, nausea, vomiting, allergic reaction, radiation exposure, metallic taste sensation and life-threatening complications (estimated to be 1 in 10,000)], benefits (risk stratification, diagnosing coronary artery disease, treatment guidance) and alternatives of a nuclear stress test were discussed in detail with Jared Whitaker and he agrees to proceed.     Disposition: F/u with Dr. Saunders Revel or an APP after [facility] MPI and EP evaluation.   Medication Adjustments/Labs and Tests Ordered: Current medicines are reviewed at length with the patient today.  Concerns regarding medicines are outlined above. Medication changes, Labs and Tests ordered today are summarized above and listed in the Patient Instructions accessible in Encounters.   Signed, Christell Faith, PA-C 10/03/2021 2:51 PM     [place] [address] [place] [address] [place], [place] [address] [phone]

## 2021-10-03 ENCOUNTER — Encounter: Payer: Self-pay | Admitting: Physician Assistant

## 2021-10-03 ENCOUNTER — Ambulatory Visit: Payer: Medicare HMO | Admitting: Physician Assistant

## 2021-10-03 ENCOUNTER — Other Ambulatory Visit: Payer: Self-pay

## 2021-10-03 VITALS — BP 120/70 | HR 94 | Ht 67.5 in | Wt 218.0 lb

## 2021-10-03 DIAGNOSIS — R0609 Other forms of dyspnea: Secondary | ICD-10-CM | POA: Diagnosis not present

## 2021-10-03 DIAGNOSIS — I340 Nonrheumatic mitral (valve) insufficiency: Secondary | ICD-10-CM

## 2021-10-03 DIAGNOSIS — G4733 Obstructive sleep apnea (adult) (pediatric): Secondary | ICD-10-CM

## 2021-10-03 DIAGNOSIS — J342 Deviated nasal septum: Secondary | ICD-10-CM

## 2021-10-03 DIAGNOSIS — I6529 Occlusion and stenosis of unspecified carotid artery: Secondary | ICD-10-CM | POA: Diagnosis not present

## 2021-10-03 DIAGNOSIS — J45909 Unspecified asthma, uncomplicated: Secondary | ICD-10-CM | POA: Diagnosis not present

## 2021-10-03 DIAGNOSIS — I7121 Aneurysm of the ascending aorta, without rupture: Secondary | ICD-10-CM | POA: Diagnosis not present

## 2021-10-03 DIAGNOSIS — I4821 Permanent atrial fibrillation: Secondary | ICD-10-CM

## 2021-10-03 NOTE — Patient Instructions (Addendum)
Medication Instructions:  No changes at this time.  *If you need a refill on your cardiac medications before your next appointment, please call your pharmacy*   Lab Work: None  If you have labs (blood work) drawn today and your tests are completely normal, you will receive your results only by: [place] (if you have MyChart) OR A paper copy in the mail If you have any lab test that is abnormal or we need to change your treatment, we will call you to review the results.   Testing/Procedures: [place]  Your caregiver has ordered a Stress Test with nuclear imaging. The purpose of this test is to evaluate the blood supply to your heart muscle. This procedure is referred to as a "Non-Invasive Stress Test." This is because other than having an IV started in your vein, nothing is inserted or "invades" your body. Cardiac stress tests are done to find areas of poor blood flow to the heart by determining the extent of coronary artery disease (CAD). Some patients exercise on a treadmill, which naturally increases the blood flow to your heart, while others who are  unable to walk on a treadmill due to physical limitations have a pharmacologic/chemical stress agent called Lexiscan . This medicine will mimic walking on a treadmill by temporarily increasing your coronary blood flow.   Please note: these test may take anywhere between 2-4 hours to complete  PLEASE REPORT TO [place] AT THE FIRST DESK WILL DIRECT YOU WHERE TO GO  Date of Procedure:_____________________________________  Arrival Time for Procedure:______________________________   PLEASE NOTIFY THE OFFICE AT LEAST 24 HOURS IN ADVANCE IF YOU ARE UNABLE TO KEEP YOUR APPOINTMENT.  [phone] AND  PLEASE NOTIFY NUCLEAR MEDICINE AT [facility] AT LEAST 24 HOURS IN ADVANCE IF YOU ARE UNABLE TO KEEP YOUR APPOINTMENT. [phone]  How to prepare for your Myoview test:  Do not eat or drink after  midnight No caffeine for 24 hours prior to test No smoking 24 hours prior to test. Your medication may be taken with water.  If your doctor stopped a medication because of this test, do not take that medication. Ladies, please do not wear dresses.  Skirts or pants are appropriate. Please wear a short sleeve shirt. No perfume, cologne or lotion. Wear comfortable walking shoes. No heels!    Follow-Up: At [facility], you and your health needs are our priority.  As part of our continuing mission to provide you with exceptional heart care, we have created designated Provider Care Teams.  These Care Teams include your primary Cardiologist (physician) and Advanced Practice Providers (APPs -  Physician Assistants and Nurse Practitioners) who all work together to provide you with the care you need, when you need it.   Your next appointment:   Follow up after his EP appointment.  Referral placed for him to see Dr. Quentin Ore  The format for your next appointment:   In Person  Provider:   You may see [name], MD or one of the following Advanced Practice Providers on your designated Care Team:   Murray Hodgkins, NP Christell Faith, PA-C Cadence Kathlen Mody, [place]    Other Instructions Referral placed for you to see EP provider.

## 2021-10-10 DIAGNOSIS — J4540 Moderate persistent asthma, uncomplicated: Secondary | ICD-10-CM | POA: Diagnosis not present

## 2021-10-10 DIAGNOSIS — E785 Hyperlipidemia, unspecified: Secondary | ICD-10-CM | POA: Diagnosis not present

## 2021-10-10 DIAGNOSIS — D649 Anemia, unspecified: Secondary | ICD-10-CM | POA: Diagnosis not present

## 2021-10-10 DIAGNOSIS — I482 Chronic atrial fibrillation, unspecified: Secondary | ICD-10-CM | POA: Diagnosis not present

## 2021-10-20 ENCOUNTER — Telehealth: Payer: Self-pay | Admitting: Physician Assistant

## 2021-10-20 NOTE — Telephone Encounter (Signed)
Patient came in for instructions for Monday procedure

## 2021-10-20 NOTE — Telephone Encounter (Signed)
Spoke with patient and he states they reprinted his paperwork from his visit. He stated that answered all of his questions. Reviewed where he should go for that test to be done at the [facility] entrance and check in at registration. He verbalized understanding with no further questions at this time.

## 2021-10-23 ENCOUNTER — Telehealth: Payer: Self-pay | Admitting: *Deleted

## 2021-10-23 ENCOUNTER — Encounter
Admission: RE | Admit: 2021-10-23 | Discharge: 2021-10-23 | Disposition: A | Discharge status: Home / Self Care | Payer: Medicare HMO | Source: Ambulatory Visit | Attending: Physician Assistant | Admitting: Physician Assistant

## 2021-10-23 ENCOUNTER — Other Ambulatory Visit: Payer: Self-pay

## 2021-10-23 DIAGNOSIS — I4821 Permanent atrial fibrillation: Secondary | ICD-10-CM | POA: Diagnosis not present

## 2021-10-23 DIAGNOSIS — R0609 Other forms of dyspnea: Secondary | ICD-10-CM | POA: Insufficient documentation

## 2021-10-23 LAB — NM MYOCAR MULTI W/SPECT W/WALL MOTION / EF
Base ST Depression (mm): 0 mm
Estimated workload: 1
Exercise duration (min): 0 min
Exercise duration (sec): 0 s
LV dias vol: 126 mL (ref 62–150)
LV sys vol: 36 mL
MPHR: 143 {beats}/min
Nuc Stress EF: 71 %
Peak HR: 82 {beats}/min
Percent HR: 57 %
Rest HR: 43 {beats}/min
Rest Nuclear Isotope Dose: 10.9 mCi
SDS: 7
SRS: 0
SSS: 7
ST Depression (mm): 0 mm
Stress Nuclear Isotope Dose: 30.5 mCi
TID: 0.99

## 2021-10-23 MED ORDER — TECHNETIUM TC 99M TETROFOSMIN IV KIT
30.0000 | PACK | Freq: Once | INTRAVENOUS | Status: AC | PRN
  Administered 2021-10-23: 30.5 via INTRAVENOUS

## 2021-10-23 MED ORDER — REGADENOSON 0.4 MG/5ML IV SOLN
0.4000 mg | Freq: Once | INTRAVENOUS | Status: AC
Start: 2021-10-23 — End: 2021-10-23
  Administered 2021-10-23: 0.4 mg via INTRAVENOUS
  Filled 2021-10-23: qty 5

## 2021-10-23 MED ORDER — TECHNETIUM TC 99M TETROFOSMIN IV KIT
10.0000 | PACK | Freq: Once | INTRAVENOUS | Status: AC | PRN
  Administered 2021-10-23: 10.9 via INTRAVENOUS

## 2021-10-23 NOTE — Telephone Encounter (Signed)
-----   Message from Rise Mu, [place] sent at 10/23/2021  4:10 PM EST ----- Please inform the patient his stress test showed no evidence of ischemia or prior MI with normal pump function and was overall low risk.  CT attenuated corrected images did demonstrate coronary artery calcifications and aortic atherosclerosis.  His cholesterol was well controlled at last check.  Overall reassuring stress test.  Follow-up after EP evaluation, as currently scheduled, to reassess symptoms.

## 2021-10-23 NOTE — Telephone Encounter (Signed)
Left voicemail message to call back for review of results.  

## 2021-10-24 NOTE — Telephone Encounter (Signed)
Reviewed results and recommendations with patient. Confirmed appointments currently scheduled. He verbalized understanding of our conversation with no further questions at this time.

## 2021-11-08 DIAGNOSIS — D649 Anemia, unspecified: Secondary | ICD-10-CM | POA: Diagnosis not present

## 2021-11-08 DIAGNOSIS — Z86010 Personal history of colonic polyps: Secondary | ICD-10-CM | POA: Diagnosis not present

## 2021-11-08 DIAGNOSIS — R195 Other fecal abnormalities: Secondary | ICD-10-CM | POA: Diagnosis not present

## 2021-11-14 NOTE — Progress Notes (Signed)
Electrophysiology Office Note:    Date:  11/15/2021   ID:  Jared Ochoa., DOB 03-Apr-1946, MRN [id number]  PCP:  Leonel Ramsay, MD  [facility] Cardiologist:  [name], MD  [facility] Electrophysiologist:  Vickie Epley, MD   Referring MD: Leonel Ramsay, MD   Chief Complaint: AF  History of Present Illness:    Jared Denomme. is a 76 y.o. male who presents for an evaluation of AF at the request of Christell Faith, PA-C. Their medical history includes AF, RBBB, asthma, mitral regurgitation, CAS, OSA not on CPAP and dilated aorta.   He was previously seen by Dr [name]. He failed flecainide. EF has been normal while in AF and he reportedly was asymptomatic. Over time he has developed dyspnea with exertion and he is concerned that these symptoms may be due to his atrial fibrillation. He presents to discuss rhythm control strategies for his AF.  His last documented sinus rhythm ECG was in 2016.  Today he confirms the above.  He tells me that he is noticed a decreased exercise tolerance.  He has a friend who has atrial fibrillation and uses a medication to maintain normal rhythm.  After discussing his medical situation with his friend, he wonders whether or not he could also be returned to normal rhythm.  He takes Xarelto without any bleeding issues.   Past Medical History:  Diagnosis Date   Anemia    Anemia    Colon polyp    Tubular adenoma- in colonoscopy in 07/2006 followed by GI Dr Armanda Magic dermatitis    Dilated aortic root ([place])    ascending aorta 56mm by echo 07/2017   Hyperlipidemia    pateint denies this   Hypertension    patient denies this   Normal cardiac stress test    Osteoarthritis    of the shoulders   Overweight    Persistent atrial fibrillation ([place])    chads2vasc score is 1-2 (pt denies HTN)   Shortness of breath    Snoring    w/u for apnea is in progress   Stroke [facility])     Past Surgical History:  Procedure Laterality Date    CARDIOVERSION N/A 03/03/2014   Procedure: CARDIOVERSION;  Surgeon: Carlena Bjornstad, MD;  Location: [place];  Service: Cardiovascular;  Laterality: N/A;   CARDIOVERSION N/A 01/31/2015   Procedure: CARDIOVERSION;  Surgeon: Carlena Bjornstad, MD;  Location: [facility] ENDOSCOPY;  Service: Cardiovascular;  Laterality: N/A;   COLONOSCOPY     left leg fracture  1969    Current Medications: Current Meds  Medication Sig   albuterol (VENTOLIN HFA) 108 (90 Base) MCG/ACT inhaler Inhale 2 puffs into the lungs every 6 (six) hours as needed for wheezing or shortness of breath.   azelastine (ASTELIN) 0.1 % nasal spray Place 1 spray into both nostrils 2 (two) times daily. Use in each nostril as directed   betamethasone dipropionate 0.05 % cream Apply topically 2 (two) times daily.   carboxymethylcellulose (REFRESH PLUS) 0.5 % SOLN 3 (three) times daily as needed   Cyanocobalamin (VITAMIN B 12 PO) Take by mouth. Daily   fluticasone (FLONASE) 50 MCG/ACT nasal spray Place 1 spray into both nostrils daily.   fluticasone furoate-vilanterol (BREO ELLIPTA) 100-25 MCG/INH AEPB Inhale 1 puff into the lungs daily.   furosemide (LASIX) 20 MG tablet Take 1 tablet (20 mg total) by mouth daily as needed. Take for swelling, edema, or shortness of breath   montelukast (SINGULAIR)  10 MG tablet Take 1 tablet (10 mg total) by mouth at bedtime.   rivaroxaban (XARELTO) 20 MG TABS tablet Take 1 tablet (20 mg total) by mouth daily with supper.   rosuvastatin (CRESTOR) 5 MG tablet Take 5 mg by mouth daily.     Allergies:   Penicillins   Social History   Socioeconomic History   Marital status: Single    Spouse name: Not on file   Number of children: Not on file   Years of education: Not on file   Highest education level: Not on file  Occupational History   Not on file  Tobacco Use   Smoking status: Never   Smokeless tobacco: Never   Tobacco comments:    Secondhand smoke exposure  Vaping Use   Vaping Use: Never used   Substance and Sexual Activity   Alcohol use: Yes    Alcohol/week: 1.0 - 2.0 standard drink    Types: 1 - 2 Cans of beer per week    Comment: 3 six packs a week   Drug use: No   Sexual activity: Not on file  Other Topics Concern   Not on file  Social History Narrative   Lives in [place].  Works an a Nurse, adult.   Social Determinants of Health   Financial Resource Strain: Not on file  Food Insecurity: Not on file  Transportation Needs: Not on file  Physical Activity: Not on file  Stress: Not on file  Social Connections: Not on file     Family History: The patient's family history includes Bladder Cancer in his father; CVA in his mother; Cancer in his paternal uncle; Heart attack in his father and mother.  ROS:   Please see the history of present illness.    All other systems reviewed and are negative.  EKGs/Labs/Other Studies Reviewed:    The following studies were reviewed today:  10/23/2021 SPECT - low risk, no ischemia  07/07/2021 Echo - LV EF 60%, RV normal, mildly dilated LA, moderate MR, mild AI, dilated ascending aorta  EKG:  The ekg ordered today demonstrates atrial fibrillation with a ventricular rate of 57 bpm   Recent Labs: No results found for requested labs within last 8760 hours.  Recent Lipid Panel No results found for: CHOL, TRIG, HDL, CHOLHDL, VLDL, LDLCALC, LDLDIRECT  Physical Exam:    VS:  BP 128/72 (BP Location: Left Arm, Patient Position: Sitting, Cuff Size: Normal)    Pulse (!) 54    Ht 5' 7.5" (1.715 m)    Wt 214 lb (97.1 kg)    SpO2 96%    BMI 33.02 kg/m     Wt Readings from Last 3 Encounters:  11/15/21 214 lb (97.1 kg)  10/03/21 218 lb (98.9 kg)  09/20/21 214 lb 9.6 oz (97.3 kg)     GEN:  Well nourished, well developed in no acute distress HEENT: Normal NECK: No JVD; No carotid bruits LYMPHATICS: No lymphadenopathy CARDIAC: Irregularly irregular, no murmurs, rubs, gallops RESPIRATORY:  Clear to auscultation  without rales, wheezing or rhonchi  ABDOMEN: Soft, non-tender, non-distended MUSCULOSKELETAL:  No edema; No deformity  SKIN: Warm and dry NEUROLOGIC:  Alert and oriented x 3 PSYCHIATRIC:  Normal affect       ASSESSMENT:    1. Persistent atrial fibrillation ([place])   2. Mitral valve insufficiency, unspecified etiology   3. Ascending aorta dilation (H[facility])    PLAN:    In order of problems listed above:  #Longstanding persistent atrial fibrillation His  A. fib could be permanent.  He has relatively nonspecific symptoms that I am unsure are related to A. fib.  He takes Xarelto for stroke prophylaxis.  I discussed possibilities for his rhythm control moving forward.  If he would like another trial at normal rhythm, would plan for cardioversion.  If he is able to maintain normal rhythm even for a few days, could consider starting an antiarrhythmic drug.  Would favor amiodarone or dofetilide.  If he has very early return to atrial fibrillation after cardioversion, would deem his A. fib to be permanent.  Follow-up with me on an as-needed basis.  He will let us know whether or not he would like to pursue cardioversion.   Total time spent with patient today 30 minutes. This includes reviewing records, evaluating the patient and coordinating care.  Medication Adjustments/Labs and Tests Ordered: Current medicines are reviewed at length with the patient today.  Concerns regarding medicines are outlined above.  No orders of the defined types were placed in this encounter.  No orders of the defined types were placed in this encounter.    Signed, Hilton Cork. Quentin Ore, MD, [facility] 11/15/2021 9:40 AM    Electrophysiology [facility]

## 2021-11-15 ENCOUNTER — Other Ambulatory Visit: Payer: Self-pay

## 2021-11-15 ENCOUNTER — Ambulatory Visit (INDEPENDENT_AMBULATORY_CARE_PROVIDER_SITE_OTHER): Payer: Medicare HMO | Admitting: Cardiology

## 2021-11-15 ENCOUNTER — Encounter: Payer: Self-pay | Admitting: Cardiology

## 2021-11-15 VITALS — BP 128/72 | HR 54 | Ht 67.5 in | Wt 214.0 lb

## 2021-11-15 DIAGNOSIS — I340 Nonrheumatic mitral (valve) insufficiency: Secondary | ICD-10-CM | POA: Diagnosis not present

## 2021-11-15 DIAGNOSIS — I4819 Other persistent atrial fibrillation: Secondary | ICD-10-CM

## 2021-11-15 DIAGNOSIS — I77810 Thoracic aortic ectasia: Secondary | ICD-10-CM

## 2021-11-15 NOTE — Patient Instructions (Addendum)
Your physician recommends that you continue on your current medications as directed. Please refer to the Current Medication list given to you today. *If you need a refill on your cardiac medications before your next appointment, please call your pharmacy*  Lab Work: None. If you have labs (blood work) drawn today and your tests are completely normal, you will receive your results only by: [place] (if you have MyChart) OR A paper copy in the mail If you have any lab test that is abnormal or we need to change your treatment, we will call you to review the results.  Testing/Procedures: None.  Follow-Up: At [facility], you and your health needs are our priority.  As part of our continuing mission to provide you with exceptional heart care, we have created designated Provider Care Teams.  These Care Teams include your primary Cardiologist (physician) and Advanced Practice Providers (APPs -  Physician Assistants and Nurse Practitioners) who all work together to provide you with the care you need, when you need it.  Your physician wants you to follow-up in: Please push out next scheduled appt to ~ 3 months. As needed with Dr. Quentin Ore   We recommend signing up for the patient portal called "MyChart".  Sign up information is provided on this After Visit Summary.  MyChart is used to connect with patients for Virtual Visits (Telemedicine).  Patients are able to view lab/test results, encounter notes, upcoming appointments, etc.  Non-urgent messages can be sent to your provider as well.   To learn more about what you can do with MyChart, go to [email or web address].    Any Other Special Instructions Will Be Listed Below (If Applicable).   Electrical Cardioversion Electrical cardioversion is the delivery of a jolt of electricity to restore a normal rhythm to the heart. A rhythm that is too fast or is not regular keeps the heart from pumping well. In this procedure, sticky patches or metal  paddles are placed on the chest to deliver electricity to the heart from a device. This procedure may be done in an emergency if: There is low or no blood pressure as a result of the heart rhythm. Normal rhythm must be restored as fast as possible to protect the brain and heart from further damage. It may save a life. This may also be a scheduled procedure for irregular or fast heart rhythms that are not immediately life-threatening. Tell a health care provider about: Any allergies you have. All medicines you are taking, including vitamins, herbs, eye drops, creams, and over-the-counter medicines. Any problems you or family members have had with anesthetic medicines. Any blood disorders you have. Any surgeries you have had. Any medical conditions you have. Whether you are pregnant or may be pregnant. What are the risks? Generally, this is a safe procedure. However, problems may occur, including: Allergic reactions to medicines. A blood clot that breaks free and travels to other parts of your body. The possible return of an abnormal heart rhythm within hours or days after the procedure. Your heart stopping (cardiac arrest). This is rare. What happens before the procedure? Medicines Your health care provider may have you start taking: Blood-thinning medicines (anticoagulants) so your blood does not clot as easily. Medicines to help stabilize your heart rate and rhythm. Ask your health care provider about: Changing or stopping your regular medicines. This is especially important if you are taking diabetes medicines or blood thinners. Taking medicines such as aspirin and ibuprofen. These medicines can thin your blood. Do not  take these medicines unless your health care provider tells you to take them. Taking over-the-counter medicines, vitamins, herbs, and supplements. General instructions Follow instructions from your health care provider about eating or drinking restrictions. Plan to  have someone take you home from the hospital or clinic. If you will be going home right after the procedure, plan to have someone with you for 24 hours. Ask your health care provider what steps will be taken to help prevent infection. These may include washing your skin with a germ-killing soap. What happens during the procedure?  An IV will be inserted into one of your veins. Sticky patches (electrodes) or metal paddles may be placed on your chest. You will be given a medicine to help you relax (sedative). An electrical shock will be delivered. The procedure may vary among health care providers and hospitals. What can I expect after the procedure? Your blood pressure, heart rate, breathing rate, and blood oxygen level will be monitored until you leave the hospital or clinic. Your heart rhythm will be watched to make sure it does not change. You may have some redness on the skin where the shocks were given. Follow these instructions at home: Do not drive for 24 hours if you were given a sedative during your procedure. Take over-the-counter and prescription medicines only as told by your health care provider. Ask your health care provider how to check your pulse. Check it often. Rest for 48 hours after the procedure or as told by your health care provider. Avoid or limit your caffeine use as told by your health care provider. Keep all follow-up visits as told by your health care provider. This is important. Contact a health care provider if: You feel like your heart is beating too quickly or your pulse is not regular. You have a serious muscle cramp that does not go away. Get help right away if: You have discomfort in your chest. You are dizzy or you feel faint. You have trouble breathing or you are short of breath. Your speech is slurred. You have trouble moving an arm or leg on one side of your body. Your fingers or toes turn cold or blue. Summary Electrical cardioversion is the  delivery of a jolt of electricity to restore a normal rhythm to the heart. This procedure may be done right away in an emergency or may be a scheduled procedure if the condition is not an emergency. Generally, this is a safe procedure. After the procedure, check your pulse often as told by your health care provider. This information is not intended to replace advice given to you by your health care provider. Make sure you discuss any questions you have with your health care provider. Document Revised: 06/01/2019 Document Reviewed: 06/01/2019 Elsevier Patient Education  [place].

## 2021-11-27 ENCOUNTER — Ambulatory Visit: Payer: Medicare HMO | Admitting: Physician Assistant

## 2022-01-09 ENCOUNTER — Telehealth: Payer: Self-pay | Admitting: *Deleted

## 2022-01-09 DIAGNOSIS — I6523 Occlusion and stenosis of bilateral carotid arteries: Secondary | ICD-10-CM

## 2022-01-09 NOTE — Telephone Encounter (Signed)
° °  Pre-operative Risk Assessment    Patient Name: Jared Whitaker.  DOB: 27-Dec-1945 MRN: [id number]      Request for Surgical Clearance    Procedure:   COLONOSCOPY  Date of Surgery:  Clearance 04/05/22                                 Surgeon:  DR. Virgina Jock Surgeon's Group or Practice Name:  [facility] Phone number:  [phone] Fax number:  [phone] ATTN: [place]   Type of Clearance Requested:   - Medical  - Pharmacy:  Hold Rivaroxaban (Xarelto)     Type of Anesthesia:  General    Additional requests/questions:    Jiles Prows   01/09/2022, 3:24 PM

## 2022-01-11 DIAGNOSIS — I6523 Occlusion and stenosis of bilateral carotid arteries: Secondary | ICD-10-CM | POA: Insufficient documentation

## 2022-01-11 NOTE — Telephone Encounter (Signed)
Patient with diagnosis of afib on Xarelto for anticoagulation.   ? ?Procedure: colonoscopy ?Date of procedure: 04/05/22 ? ?CHA2DS2-VASc Score = 6  ?This indicates a 9.7% annual risk of stroke. ?The patient's score is based upon: ?CHF History: 0 ?HTN History: 1 ?Diabetes History: 0 ?Stroke History: 2 ?Vascular Disease History: 1 ?Age Score: 2 ?Gender Score: 0 ?  ?CrCl 45mL/min using adjusted body weight ?Platelet count 275K ? ?Per office protocol, patient can hold Xarelto for 1 day prior to procedure. He should resume as soon as safely possible after given elevated CV risk. ?

## 2022-01-11 NOTE — Telephone Encounter (Signed)
? ?  Primary Cardiologist: [name], MD ? ?Chart reviewed as part of pre-operative protocol coverage. Given past medical history and time since last visit, based on ACC/AHA guidelines, Jared Whitaker. would be at acceptable risk for the planned procedure without further cardiovascular testing.  ? ?Patient with diagnosis of afib on Xarelto for anticoagulation.   ?  ?Procedure: colonoscopy ?Date of procedure: 04/05/22 ?  ?CHA2DS2-VASc Score = 6  ?This indicates a 9.7% annual risk of stroke. ?The patient's score is based upon: ?CHF History: 0 ?HTN History: 1 ?Diabetes History: 0 ?Stroke History: 2 ?Vascular Disease History: 1 ?Age Score: 2 ?Gender Score: 0 ?  ?CrCl 82mL/min using adjusted body weight ?Platelet count 275K ?  ?Per office protocol, patient can hold Xarelto for 1 day prior to procedure. He should resume as soon as safely possible after given elevated CV risk. ? ?I will route this recommendation to the requesting party via Epic fax function and remove from pre-op pool. ? ?Please call with questions. ? ?Jossie Ng. Shelise Maron NP-C ? ?  ?01/11/2022, 3:52 PM ?[place] ?[address] ?Office [phone] Fax [phone] ? ? ? ? ?

## 2022-02-08 DIAGNOSIS — H524 Presbyopia: Secondary | ICD-10-CM | POA: Diagnosis not present

## 2022-02-08 DIAGNOSIS — Z961 Presence of intraocular lens: Secondary | ICD-10-CM | POA: Diagnosis not present

## 2022-02-14 ENCOUNTER — Ambulatory Visit: Payer: Medicare HMO | Admitting: Cardiology

## 2022-02-14 ENCOUNTER — Encounter: Payer: Self-pay | Admitting: Cardiology

## 2022-02-14 VITALS — BP 110/68 | HR 52 | Ht 67.5 in | Wt 216.0 lb

## 2022-02-14 DIAGNOSIS — I4821 Permanent atrial fibrillation: Secondary | ICD-10-CM

## 2022-02-14 NOTE — Progress Notes (Signed)
?Electrophysiology Office Follow up Visit Note:   ? ?Date:  02/14/2022  ? ?ID:  Jared Whitaker., DOB January 23, 1946, MRN [id number] ? ?PCP:  Jared Ramsay, MD  ?[facility] Cardiologist:  Jared Him, MD  ?[facility] Electrophysiologist:  Jared Epley, MD  ? ? ?Interval History:   ? ?Jared Whitaker. is a 76 y.o. male who presents for a follow up visit. They were last seen in clinic November 15, 2021 for permanent atrial fibrillation.  He is asymptomatic.  He is increasing his activity level doing daily walking.  He takes Xarelto for stroke prophylaxis without bleeding issues. ? ? ? ?  ? ?Past Medical History:  ?Diagnosis Date  ? Anemia   ? Anemia   ? Colon polyp   ? Tubular adenoma- in colonoscopy in 07/2006 followed by GI Dr [name]  ? Contact dermatitis   ? Dilated aortic root ([place])   ? ascending aorta 39m by echo 07/2017  ? Hyperlipidemia   ? pateint denies this  ? Hypertension   ? patient denies this  ? Normal cardiac stress test   ? Osteoarthritis   ? of the shoulders  ? Overweight   ? [place]   ? chads2vasc score is 1-2 (pt denies HTN)  ? Shortness of breath   ? Snoring   ? w/u for apnea is in progress  ? Stroke ([facility]   ? ? ?Past Surgical History:  ?Procedure Laterality Date  ? CARDIOVERSION N/A 03/03/2014  ? Procedure: CARDIOVERSION;  Surgeon: JCarlena Bjornstad MD;  [place]  Service: Cardiovascular;  Laterality: N/A;  ? CARDIOVERSION N/A 01/31/2015  ? Procedure: CARDIOVERSION;  Surgeon: JCarlena Bjornstad MD;  [facility];  Service: Cardiovascular;  Laterality: N/A;  ? COLONOSCOPY    ? left leg fracture  1969  ? ? ?Current Medications: ?Current Meds  ?Medication Sig  ? albuterol (VENTOLIN HFA) 108 (90 Base) MCG/ACT inhaler Inhale 2 puffs into the lungs every 6 (six) hours as needed for wheezing or shortness of breath.  ? azelastine (ASTELIN) 0.1 % nasal spray Place 1 spray into both nostrils 2 (two) times daily. Use in each nostril as directed  ?  betamethasone dipropionate 0.05 % cream Apply topically 2 (two) times daily.  ? carboxymethylcellulose (REFRESH PLUS) 0.5 % SOLN 3 (three) times daily as needed  ? Cyanocobalamin (VITAMIN B 12 PO) Take by mouth. Daily  ? fluticasone (FLONASE) 50 MCG/ACT nasal spray Place 1 spray into both nostrils daily.  ? fluticasone furoate-vilanterol (BREO ELLIPTA) 100-25 MCG/INH AEPB Inhale 1 puff into the lungs daily.  ? furosemide (LASIX) 20 MG tablet Take 1 tablet (20 mg total) by mouth daily as needed. Take for swelling, edema, or shortness of breath  ? montelukast (SINGULAIR) 10 MG tablet Take 1 tablet (10 mg total) by mouth at bedtime.  ? rivaroxaban (XARELTO) 20 MG TABS tablet Take 1 tablet (20 mg total) by mouth daily with supper.  ? rosuvastatin (CRESTOR) 5 MG tablet Take 5 mg by mouth daily.  ?  ? ?Allergies:   Penicillins  ? ?Social History  ? ?Socioeconomic History  ? Marital status: Single  ?  Spouse name: Not on file  ? Number of children: Not on file  ? Years of education: Not on file  ? Highest education level: Not on file  ?Occupational History  ? Not on file  ?Tobacco Use  ? Smoking status: Never  ? Smokeless tobacco: Never  ? Tobacco comments:  ?  Secondhand smoke exposure  ?Vaping Use  ? Vaping Use: Never used  ?Substance and Sexual Activity  ? Alcohol use: Yes  ?  Alcohol/week: 1.0 - 2.0 standard drink  ?  Types: 1 - 2 Cans of beer per week  ?  Comment: 3 six packs a week  ? Drug use: No  ? Sexual activity: Not on file  ?Other Topics Concern  ? Not on file  ?Social History Narrative  ? 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  ? ?Social Determinants of Health  ? ?Financial Resource Strain: Not on file  ?Food Insecurity: Not on file  ?Transportation Needs: Not on file  ?Physical Activity: Not on file  ?Stress: Not on file  ?Social Connections: Not on file  ?  ? ?Family History: ?The patient's family history includes Bladder Cancer in his father; CVA in his mother; Cancer in his paternal  uncle; Heart attack in his father and mother. ? ?ROS:   ?Please see the history of present illness.    ?All other systems reviewed and are negative. ? ?EKGs/Labs/Other Studies Reviewed:   ? ?The following studies were reviewed today: ? ? ?EKG:  The ekg ordered today demonstrates atrial fibrillation with a slow ventricular response and a ventricular rate of 52 bpm. ? ?Recent Labs: ?No results found for requested labs within last 8760 hours.  ?Recent Lipid Panel ?No results found for: CHOL, TRIG, HDL, CHOLHDL, VLDL, LDLCALC, LDLDIRECT ? ?Physical Exam:   ? ?VS:  BP 110/68 (BP Location: Left Arm, Patient Position: Sitting, Cuff Size: Normal)   Pulse (!) 52   Ht 5' 7.5" (1.715 m)   Wt 216 lb (98 kg)   SpO2 98%   BMI 33.33 kg/m?    ? ?Wt Readings from Last 3 Encounters:  ?02/14/22 216 lb (98 kg)  ?11/15/21 214 lb (97.1 kg)  ?10/03/21 218 lb (98.9 kg)  ?  ? ?GEN:  Well nourished, well developed in no acute distress.  Obese ?HEENT: Normal ?NECK: No JVD; No carotid bruits ?LYMPHATICS: No lymphadenopathy ?CARDIAC: Irregularly irregular, slow rate, no murmurs, rubs, gallops ?RESPIRATORY:  Clear to auscultation without rales, wheezing or rhonchi  ?ABDOMEN: Soft, non-tender, non-distended ?MUSCULOSKELETAL:  No edema; No deformity  ?SKIN: Warm and dry ?NEUROLOGIC:  Alert and oriented x 3 ?PSYCHIATRIC:  Normal affect  ? ? ? ?  ? ?ASSESSMENT:   ? ?No diagnosis found. ?PLAN:   ? ?In order of problems listed above: ? ? ?#Permanent atrial fibrillation ?Asymptomatic.  On Xarelto for stroke prophylaxis.  Rate controlled.  No indication for rhythm control strategy.  I will be available on an as-needed basis.  Continue routine follow-up with Dr. Saunders Revel. ? ?Medication Adjustments/Labs and Tests Ordered: ?Current medicines are reviewed at length with the patient today.  Concerns regarding medicines are outlined above.  ?No orders of the defined types were placed in this encounter. ? ?No orders of the defined types were placed in this  encounter. ? ? ? ?Signed, ?Lars Mage, MD, [facility], [facility] ?02/14/2022 1:48 PM    ?Electrophysiology ?[place] ?

## 2022-02-14 NOTE — Patient Instructions (Addendum)
Medications: ?Your physician recommends that you continue on your current medications as directed. Please refer to the Current Medication list given to you today. ?*If you need a refill on your cardiac medications before your next appointment, please call your pharmacy* ? ?Lab Work: ?None. ?If you have labs (blood work) drawn today and your tests are completely normal, you will receive your results only by: ?MyChart Message (if you have MyChart) OR ?A paper copy in the mail ?If you have any lab test that is abnormal or we need to change your treatment, we will call you to review the results. ? ?Testing/Procedures: ?None. ? ?Follow-Up: ?At [facility], you and your health needs are our priority.  As part of our continuing mission to provide you with exceptional heart care, we have created designated Provider Care Teams.  These Care Teams include your primary Cardiologist (physician) and Advanced Practice Providers (APPs -  Physician Assistants and Nurse Practitioners) who all work together to provide you with the care you need, when you need it. ? ?Your physician wants you to follow-up in: Follow up with Dr. Saunders Revel in 6 month.  ? ? ?We recommend signing up for the patient portal called "MyChart".  Sign up information is provided on this After Visit Summary.  MyChart is used to connect with patients for Virtual Visits (Telemedicine).  Patients are able to view lab/test results, encounter notes, upcoming appointments, etc.  Non-urgent messages can be sent to your provider as well.   ?To learn more about what you can do with MyChart, go to [email or web address].   ? ?Any Other Special Instructions Will Be Listed Below (If Applicable).  ?

## 2022-03-26 DIAGNOSIS — I4891 Unspecified atrial fibrillation: Secondary | ICD-10-CM | POA: Diagnosis not present

## 2022-03-26 DIAGNOSIS — Z7901 Long term (current) use of anticoagulants: Secondary | ICD-10-CM | POA: Diagnosis not present

## 2022-03-26 DIAGNOSIS — G4733 Obstructive sleep apnea (adult) (pediatric): Secondary | ICD-10-CM | POA: Diagnosis not present

## 2022-03-26 DIAGNOSIS — J45909 Unspecified asthma, uncomplicated: Secondary | ICD-10-CM | POA: Diagnosis not present

## 2022-03-26 DIAGNOSIS — E785 Hyperlipidemia, unspecified: Secondary | ICD-10-CM | POA: Diagnosis not present

## 2022-04-06 ENCOUNTER — Other Ambulatory Visit: Payer: Self-pay | Admitting: Internal Medicine

## 2022-04-06 NOTE — Telephone Encounter (Signed)
Refill request

## 2022-04-06 NOTE — Telephone Encounter (Signed)
Prescription refill request for Xarelto received.   Indication: afib  Last office visit: Jared Whitaker 02/14/2022 Weight: 98 kg  Age: 76 yo  Scr: 1.2, 03/26/2022 CrCl: 71 ml/min   Refill sent.

## 2022-04-27 ENCOUNTER — Encounter: Payer: Self-pay | Admitting: Gastroenterology

## 2022-04-30 ENCOUNTER — Ambulatory Visit
Admission: RE | Admit: 2022-04-30 | Discharge: 2022-04-30 | Disposition: A | Discharge status: Home / Self Care | Payer: Medicare HMO | Attending: Gastroenterology | Admitting: Gastroenterology

## 2022-04-30 ENCOUNTER — Encounter: Payer: Self-pay | Admitting: Gastroenterology

## 2022-04-30 HISTORY — DX: Unspecified asthma, uncomplicated: J45.909

## 2022-04-30 HISTORY — DX: Occlusion and stenosis of unspecified carotid artery: I65.29

## 2022-04-30 HISTORY — DX: Sleep apnea, unspecified: G47.30

## 2022-04-30 NOTE — OR Nursing (Signed)
Endorses that he did not stop taking Xerelto until Saturday. Dr. Virgina Jock in to see patient and decision was made to withhold Lennette Bihari one more day and reschedule until tomorrow. Patient was discharged via ambulation.

## 2022-05-01 ENCOUNTER — Encounter
Admission: RE | Disposition: A | Discharge status: Home / Self Care | Payer: Self-pay | Source: Home / Self Care | Attending: Gastroenterology

## 2022-05-01 ENCOUNTER — Ambulatory Visit: Payer: Medicare HMO | Admitting: Registered Nurse

## 2022-05-01 ENCOUNTER — Other Ambulatory Visit: Payer: Self-pay

## 2022-05-01 ENCOUNTER — Ambulatory Visit
Admission: RE | Admit: 2022-05-01 | Discharge: 2022-05-01 | Disposition: A | Discharge status: Home / Self Care | Payer: Medicare HMO | Attending: Gastroenterology | Admitting: Gastroenterology

## 2022-05-01 ENCOUNTER — Encounter: Payer: Self-pay | Admitting: Gastroenterology

## 2022-05-01 DIAGNOSIS — K621 Rectal polyp: Secondary | ICD-10-CM | POA: Insufficient documentation

## 2022-05-01 DIAGNOSIS — D128 Benign neoplasm of rectum: Secondary | ICD-10-CM | POA: Insufficient documentation

## 2022-05-01 DIAGNOSIS — G4730 Sleep apnea, unspecified: Secondary | ICD-10-CM | POA: Diagnosis not present

## 2022-05-01 DIAGNOSIS — D125 Benign neoplasm of sigmoid colon: Secondary | ICD-10-CM | POA: Diagnosis not present

## 2022-05-01 DIAGNOSIS — D124 Benign neoplasm of descending colon: Secondary | ICD-10-CM | POA: Insufficient documentation

## 2022-05-01 DIAGNOSIS — D123 Benign neoplasm of transverse colon: Secondary | ICD-10-CM | POA: Insufficient documentation

## 2022-05-01 DIAGNOSIS — K635 Polyp of colon: Secondary | ICD-10-CM | POA: Diagnosis not present

## 2022-05-01 DIAGNOSIS — E785 Hyperlipidemia, unspecified: Secondary | ICD-10-CM | POA: Diagnosis not present

## 2022-05-01 DIAGNOSIS — K640 First degree hemorrhoids: Secondary | ICD-10-CM | POA: Insufficient documentation

## 2022-05-01 DIAGNOSIS — J45909 Unspecified asthma, uncomplicated: Secondary | ICD-10-CM | POA: Insufficient documentation

## 2022-05-01 DIAGNOSIS — Z1211 Encounter for screening for malignant neoplasm of colon: Secondary | ICD-10-CM | POA: Diagnosis not present

## 2022-05-01 DIAGNOSIS — K5730 Diverticulosis of large intestine without perforation or abscess without bleeding: Secondary | ICD-10-CM | POA: Insufficient documentation

## 2022-05-01 DIAGNOSIS — R195 Other fecal abnormalities: Secondary | ICD-10-CM | POA: Insufficient documentation

## 2022-05-01 DIAGNOSIS — I10 Essential (primary) hypertension: Secondary | ICD-10-CM | POA: Diagnosis not present

## 2022-05-01 DIAGNOSIS — K649 Unspecified hemorrhoids: Secondary | ICD-10-CM | POA: Diagnosis not present

## 2022-05-01 DIAGNOSIS — Z86010 Personal history of colonic polyps: Secondary | ICD-10-CM | POA: Diagnosis not present

## 2022-05-01 DIAGNOSIS — Z8673 Personal history of transient ischemic attack (TIA), and cerebral infarction without residual deficits: Secondary | ICD-10-CM | POA: Insufficient documentation

## 2022-05-01 DIAGNOSIS — I4891 Unspecified atrial fibrillation: Secondary | ICD-10-CM | POA: Diagnosis not present

## 2022-05-01 HISTORY — PX: COLONOSCOPY: SHX5424

## 2022-05-01 HISTORY — DX: Unspecified atrial fibrillation: I48.91

## 2022-05-01 SURGERY — COLONOSCOPY
Anesthesia: General

## 2022-05-01 MED ORDER — PROPOFOL 10 MG/ML IV BOLUS
INTRAVENOUS | Status: AC
  Filled 2022-05-01: qty 20

## 2022-05-01 MED ORDER — PHENYLEPHRINE HCL (PRESSORS) 10 MG/ML IV SOLN
INTRAVENOUS | Status: DC | PRN
  Administered 2022-05-01: 120 ug via INTRAVENOUS
  Administered 2022-05-01: 80 ug via INTRAVENOUS
  Administered 2022-05-01: 200 ug via INTRAVENOUS
  Administered 2022-05-01: 160 ug via INTRAVENOUS
  Administered 2022-05-01 (×2): 80 ug via INTRAVENOUS

## 2022-05-01 MED ORDER — PROPOFOL 500 MG/50ML IV EMUL
INTRAVENOUS | Status: DC | PRN
  Administered 2022-05-01: 150 ug/kg/min via INTRAVENOUS

## 2022-05-01 MED ORDER — PROPOFOL 10 MG/ML IV BOLUS
INTRAVENOUS | Status: DC | PRN
  Administered 2022-05-01: 90 mg via INTRAVENOUS

## 2022-05-01 MED ORDER — EPHEDRINE 5 MG/ML INJ
INTRAVENOUS | Status: AC
  Filled 2022-05-01: qty 5

## 2022-05-01 MED ORDER — SODIUM CHLORIDE 0.9 % IV SOLN: INTRAVENOUS | Status: DC

## 2022-05-01 MED ORDER — EPHEDRINE SULFATE (PRESSORS) 50 MG/ML IJ SOLN
INTRAMUSCULAR | Status: DC | PRN
  Administered 2022-05-01 (×2): 5 mg via INTRAVENOUS

## 2022-05-01 NOTE — Anesthesia Preprocedure Evaluation (Addendum)
Anesthesia Evaluation  Patient identified by MRN, date of birth, ID band Patient awake    Reviewed: Allergy & Precautions, NPO status , Patient's Chart, lab work & pertinent test results  Airway Mallampati: III  TM Distance: <3 FB Neck ROM: Full    Dental  (+) Teeth Intact   Pulmonary asthma , sleep apnea ,     + decreased breath sounds      Cardiovascular Exercise Tolerance: Good hypertension, + dysrhythmias Atrial Fibrillation  Rhythm:Irregular Rate:Abnormal  A fib with known slow ventricular response   Neuro/Psych CVA    GI/Hepatic negative GI ROS, Neg liver ROS,   Endo/Other  negative endocrine ROS  Renal/GU negative Renal ROS     Musculoskeletal   Abdominal (+) + obese,   Peds  Hematology  (+) Blood dyscrasia, anemia ,   Anesthesia Other Findings   Reproductive/Obstetrics                            Anesthesia Physical Anesthesia Plan  ASA: 3  Anesthesia Plan: General   Post-op Pain Management:    Induction: Intravenous  PONV Risk Score and Plan:   Airway Management Planned: Natural Airway  Additional Equipment:   Intra-op Plan:   Post-operative Plan:   Informed Consent: I have reviewed the patients History and Physical, chart, labs and discussed the procedure including the risks, benefits and alternatives for the proposed anesthesia with the patient or authorized representative who has indicated his/her understanding and acceptance.       Plan Discussed with: CRNA and Surgeon  Anesthesia Plan Comments:         Anesthesia Quick Evaluation

## 2022-05-01 NOTE — H&P (Signed)
Pre-Procedure H&P   Patient ID: Jared Whitaker. is a 76 y.o. male.  Gastroenterology Provider: Annamaria Helling, DO  Referring Provider: Dr. Ola Spurr PCP: Leonel Ramsay, MD  Date: 05/01/2022  HPI Jared Whitaker. is a 76 y.o. male who presents today for Colonoscopy for surveillance- phx colon polyps; positive FOBT.  76 y.o. male who presents today for  Patient on Xarelto which has been held for this procedure. Last dose Saturday evening.   He has a personal history of colon polyps last evaluated in 2013 with Dr. [name].   Has established diagnosis of anemia of chronic disease with baseline between 12 and 13.  Most recently ferritin 316 iron sat 35% MCV 97.5.  Celiac negative.  Creatinine 1.2 hemoglobin 12.3 platelets 238,000   Due to his anemia he underwent occult blood testing which was positive.  He does have periods of gum bleeding.   No other acute GI complaints  Past Medical History:  Diagnosis Date   A-fib ([place])    Anemia    Anemia    Asthma    Carotid stenosis    Colon polyp    Tubular adenoma- in colonoscopy in 07/2006 followed by GI Dr Armanda Magic dermatitis    Dilated aortic root ([place])    ascending aorta 100m by echo 07/2017   Hyperlipidemia    pateint denies this   Hypertension    patient denies this   Normal cardiac stress test    Osteoarthritis    of the shoulders   Overweight    Persistent atrial fibrillation [place]    chads2vasc score is 1-2 (pt denies HTN)   Shortness of breath    Sleep apnea    Snoring    w/u for apnea is in progress   Stroke ([facility]     Past Surgical History:  Procedure Laterality Date   CARDIOVERSION N/A 03/03/2014   Procedure: CARDIOVERSION;  Surgeon: JCarlena Bjornstad MD;  [place]  Service: Cardiovascular;  Laterality: N/A;   CARDIOVERSION N/A 01/31/2015   Procedure: CARDIOVERSION;  Surgeon: JCarlena Bjornstad MD;  [place]  Service: Cardiovascular;  Laterality: N/A;    COLONOSCOPY     COLONOSCOPY     x3   left leg fracture  11/13/1967    Family History No h/o GI disease or malignancy  Review of Systems  Constitutional:  Negative for activity change, appetite change, chills, diaphoresis, fatigue, fever and unexpected weight change.  HENT:  Negative for trouble swallowing and voice change.   Respiratory:  Negative for shortness of breath and wheezing.   Cardiovascular:  Negative for chest pain, palpitations and leg swelling.  Gastrointestinal:  Negative for abdominal distention, abdominal pain, anal bleeding, blood in stool, constipation, diarrhea, nausea and vomiting.  Musculoskeletal:  Negative for arthralgias and myalgias.  Skin:  Negative for color change and pallor.  Neurological:  Negative for dizziness, syncope and weakness.  Psychiatric/Behavioral:  Negative for confusion. The patient is not nervous/anxious.   All other systems reviewed and are negative.    Medications No current facility-administered medications on file prior to encounter.   Current Outpatient Medications on File Prior to Encounter  Medication Sig Dispense Refill   albuterol (VENTOLIN HFA) 108 (90 Base) MCG/ACT inhaler Inhale 2 puffs into the lungs every 6 (six) hours as needed for wheezing or shortness of breath. 8 g 5   azelastine (ASTELIN) 0.1 % nasal spray Place 1 spray into both nostrils 2 (two) times daily.  Use in each nostril as directed 30 mL 12   betamethasone dipropionate 0.05 % cream Apply topically 2 (two) times daily.     carboxymethylcellulose (REFRESH PLUS) 0.5 % SOLN 3 (three) times daily as needed     Cyanocobalamin (VITAMIN B 12 PO) Take by mouth. Daily     fluticasone (FLONASE) 50 MCG/ACT nasal spray Place 1 spray into both nostrils daily. 16 g 2   fluticasone furoate-vilanterol (BREO ELLIPTA) 100-25 MCG/INH AEPB Inhale 1 puff into the lungs daily. 180 each 3   furosemide (LASIX) 20 MG tablet Take 1 tablet (20 mg total) by mouth daily as needed. Take for  swelling, edema, or shortness of breath 90 tablet 3   montelukast (SINGULAIR) 10 MG tablet Take 1 tablet (10 mg total) by mouth at bedtime. 30 tablet 11   rosuvastatin (CRESTOR) 5 MG tablet Take 5 mg by mouth daily.     XARELTO 20 MG TABS tablet TAKE 1 TABLET BY MOUTH EVERY DAY WITH SUPPER 90 tablet 1    Pertinent medications related to GI and procedure were reviewed by me with the patient prior to the procedure   Current Facility-Administered Medications:    0.9 %  sodium chloride infusion, , Intravenous, Continuous, Annamaria Helling, DO, Last Rate: 20 mL/hr at 05/01/22 1238, New Bag at 05/01/22 1238      Allergies  Allergen Reactions   Penicillins Rash    Purple spots, swelling   Allergies were reviewed by me prior to the procedure  Objective   Body mass index is 33.52 kg/m. Vitals:   05/01/22 1234  BP: (!) 144/86  Pulse: (!) 59  Resp: 18  Temp: (!) 96.9 F (36.1 C)  TempSrc: Temporal  SpO2: 99%  Weight: 97.1 kg  Height: '5\' 7"'$  (1.702 m)     Physical Exam Vitals and nursing note reviewed.  Constitutional:      General: He is not in acute distress.    Appearance: Normal appearance. He is not ill-appearing, toxic-appearing or diaphoretic.  HENT:     Head: Normocephalic and atraumatic.     Nose: Nose normal.     Mouth/Throat:     Mouth: Mucous membranes are moist.     Pharynx: Oropharynx is clear.  Eyes:     General: No scleral icterus.    Extraocular Movements: Extraocular movements intact.  Cardiovascular:     Rate and Rhythm: Regular rhythm. Bradycardia present.     Heart sounds: Normal heart sounds. No murmur heard.    No friction rub. No gallop.  Pulmonary:     Effort: Pulmonary effort is normal. No respiratory distress.     Breath sounds: Normal breath sounds. No wheezing, rhonchi or rales.  Abdominal:     General: Bowel sounds are normal. There is no distension.     Palpations: Abdomen is soft.     Tenderness: There is no abdominal tenderness.  There is no guarding or rebound.  Musculoskeletal:     Cervical back: Neck supple.     Right lower leg: No edema.     Left lower leg: No edema.  Skin:    General: Skin is warm and dry.     Coloration: Skin is not jaundiced or pale.  Neurological:     General: No focal deficit present.     Mental Status: He is alert and oriented to person, place, and time. Mental status is at baseline.  Psychiatric:        Mood and Affect: Mood normal.  Behavior: Behavior normal.        Thought Content: Thought content normal.        Judgment: Judgment normal.    Assessment:  Jared Whitaker. is a 76 y.o. male  who presents today for Colonoscopy for surveillance- phx colon polyps; +FOBT.  Plan:  Colonoscopy with possible intervention today  Colonoscopy with possible biopsy, control of bleeding, polypectomy, and interventions as necessary has been discussed with the patient/patient representative. Informed consent was obtained from the patient/patient representative after explaining the indication, nature, and risks of the procedure including but not limited to death, bleeding, perforation, missed neoplasm/lesions, cardiorespiratory compromise, and reaction to medications. Opportunity for questions was given and appropriate answers were provided. Patient/patient representative has verbalized understanding is amenable to undergoing the procedure.   Annamaria Helling, DO  [facility]astroenter[facility]  Portions of the record may have been created with voice recognition software. Occasional wrong-word or 'sound-a-like' substitutions may have occurred due to the inherent limitations of voice recognition software.  Read the chart carefully and recognize, using context, where substitutions may have occurred.

## 2022-05-01 NOTE — Transfer of Care (Signed)
Immediate Anesthesia Transfer of Care Note  Patient: Jared Whitaker.  Procedure(s) Performed: COLONOSCOPY  Patient Location: Endoscopy Unit  Anesthesia Type:General  Level of Consciousness: drowsy  Airway & Oxygen Therapy: Patient Spontanous Breathing  Post-op Assessment: Report given to RN and Post -op Vital signs reviewed and stable  Post vital signs: Reviewed and stable  Last Vitals:  Vitals Value Taken Time  BP 125/64 05/01/22 1402  Temp    Pulse 68 05/01/22 1402  Resp 18 05/01/22 1402  SpO2 99 % 05/01/22 1402    Last Pain:  Vitals:   05/01/22 1402  TempSrc:   PainSc: Asleep         Complications: No notable events documented.

## 2022-05-01 NOTE — Interval H&P Note (Signed)
History and Physical Interval Note: Preprocedure H&P from 05/01/22  was reviewed and there was no interval change after seeing and examining the patient.  Written consent was obtained from the patient after discussion of risks, benefits, and alternatives. Patient has consented to proceed with Colonoscopy with possible intervention   05/01/2022 12:58 PM  [name]  has presented today for surgery, with the diagnosis of History of adenomatous polyp of colon (Z86.010).  The various methods of treatment have been discussed with the patient and family. After consideration of risks, benefits and other options for treatment, the patient has consented to  Procedure(s): COLONOSCOPY (N/A) as a surgical intervention.  The patient's history has been reviewed, patient examined, no change in status, stable for surgery.  I have reviewed the patient's chart and labs.  Questions were answered to the patient's satisfaction.     Annamaria Helling

## 2022-05-01 NOTE — Op Note (Addendum)
[facility] Gastroenterology Patient Name: Jared Whitaker Procedure Date: 05/01/2022 12:19 PM MRN: [id number] Account #: [id number] Date of Birth: [birth year] Admit Type: Outpatient Age: 76 Room: [facility] ENDO ROOM 1 Gender: Male Note Status: Supervisor Override Instrument Name: Jasper Riling [id number] Procedure:             Colonoscopy Indications:           Adenomatous polyps in the colon Providers:             Annamaria Helling DO, DO Medicines:             Monitored Anesthesia Care Complications:         No immediate complications. Estimated blood loss:                         Minimal. Procedure:             Pre-Anesthesia Assessment:                        - Prior to the procedure, a History and Physical was                         performed, and patient medications and allergies were                         reviewed. The patient is competent. The risks and                         benefits of the procedure and the sedation options and                         risks were discussed with the patient. All questions                         were answered and informed consent was obtained.                         Patient identification and proposed procedure were                         verified by the physician, the nurse, the anesthetist                         and the technician in the endoscopy suite. Mental                         Status Examination: alert and oriented. Airway                         Examination: normal oropharyngeal airway and neck                         mobility. Respiratory Examination: clear to                         auscultation. CV Examination: RRR, no murmurs, no S3                         or S4. Prophylactic Antibiotics: The patient does  not                         require prophylactic antibiotics. Prior                         Anticoagulants: The patient has taken Xarelto                         (rivaroxaban), last dose was 2 days prior to                          procedure. ASA Grade Assessment: III - A patient with                         severe systemic disease. After reviewing the risks and                         benefits, the patient was deemed in satisfactory                         condition to undergo the procedure. The anesthesia                         plan was to use monitored anesthesia care (MAC).                         Immediately prior to administration of medications,                         the patient was re-assessed for adequacy to receive                         sedatives. The heart rate, respiratory rate, oxygen                         saturations, blood pressure, adequacy of pulmonary                         ventilation, and response to care were monitored                         throughout the procedure. The physical status of the                         patient was re-assessed after the procedure.                        After obtaining informed consent, the colonoscope was                         passed under direct vision. Throughout the procedure,                         the patient's blood pressure, pulse, and oxygen                         saturations were monitored continuously. The  Colonoscope was introduced through the anus and                         advanced to the the terminal ileum, with                         identification of the appendiceal orifice and IC                         valve. The colonoscopy was performed without                         difficulty. The patient tolerated the procedure well.                         The quality of the bowel preparation was evaluated                         using the BBPS [facility] Bowel Preparation Scale) with                         scores of: Right Colon = 3, Transverse Colon = 3 and                         Left Colon = 3 (entire mucosa seen well with no                         residual staining, small fragments of stool  or opaque                         liquid). The total BBPS score equals 9. The terminal                         ileum, ileocecal valve, appendiceal orifice, and                         rectum were photographed. Findings:      The perianal and digital rectal examinations were normal. Pertinent       negatives include normal sphincter tone.      The terminal ileum appeared normal. Estimated blood loss: none.      A few small-mouthed diverticula were found in the recto-sigmoid colon.       Estimated blood loss: none.      Non-bleeding internal hemorrhoids were found during retroflexion. The       hemorrhoids were Grade I (internal hemorrhoids that do not prolapse).       Estimated blood loss: none.      Two sessile polyps were found in the transverse colon. The polyps were 3       to 4 mm in size. These polyps were removed with a cold snare. Resection       and retrieval were complete. To prevent bleeding after the polypectomy,       two hemostatic clips were successfully placed (MR conditional). There       was no bleeding at the end of the procedure. Estimated blood loss was       minimal.      A 1 to 2 mm polyp was found in the  descending colon. The polyp was       sessile. The polyp was removed with a jumbo cold forceps. Resection and       retrieval were complete. Estimated blood loss was minimal.      Two sessile polyps were found in the sigmoid colon and descending colon.       The polyps were 2 to 3 mm in size. These polyps were removed with a cold       snare. Resection and retrieval were complete. Estimated blood loss was       minimal.      A 1 to 2 mm polyp was found in the rectum. The polyp was sessile. The       polyp was removed with a jumbo cold forceps. Resection and retrieval       were complete. Estimated blood loss was minimal.      The exam was otherwise without abnormality on direct and retroflexion       views.      There was moderate spasm in the entire colon.  Estimated blood loss: none. Impression:            - The examined portion of the ileum was normal.                        - Diverticulosis in the recto-sigmoid colon.                        - Non-bleeding internal hemorrhoids.                        - Two 3 to 4 mm polyps in the transverse colon,                         removed with a cold snare. Resected and retrieved.                         Clips (MR conditional) were placed.                        - One 1 to 2 mm polyp in the descending colon, removed                         with a jumbo cold forceps. Resected and retrieved.                        - Two 2 to 3 mm polyps in the sigmoid colon and in the                         descending colon, removed with a cold snare. Resected                         and retrieved.                        - One 1 to 2 mm polyp in the rectum, removed with a                         jumbo cold forceps. Resected and retrieved.                        -  The examination was otherwise normal on direct and                         retroflexion views.                        - Moderate colonic spasm. Recommendation:        - Discharge patient to home.                        - Resume previous diet.                        - Continue present medications.                        - Resume Xarelto (rivaroxaban) at prior dose in 2                         days. Refer to managing physician for further                         adjustment of therapy.                        - Await pathology results.                        - Repeat colonoscopy for surveillance based on                         pathology results.                        - Return to referring physician as previously                         scheduled.                        - The findings and recommendations were discussed with                         the patient.                        - No aspirin, ibuprofen, naproxen, or other                          non-steroidal anti-inflammatory drugs for 5 days after                         polyp removal. Procedure Code(s):     --- Professional ---                        [phone], Colonoscopy, flexible; with removal of                         tumor(s), polyp(s), or other lesion(s) by snare                         technique  78469, 59, Colonoscopy, flexible; with biopsy, single                         or multiple Diagnosis Code(s):     --- Professional ---                        K64.0, First degree hemorrhoids                        K63.5, Polyp of colon                        K62.1, Rectal polyp                        R19.5, Other fecal abnormalities                        K57.30, Diverticulosis of large intestine without                         perforation or abscess without bleeding CPT copyright 2019 American Medical Association. All rights reserved. The codes documented in this report are preliminary and upon coder review may  be revised to meet current compliance requirements. Attending Participation:      I personally performed the entire procedure. Volney American, DO Annamaria Helling DO, DO 05/01/2022 2:04:04 PM This report has been signed electronically. Number of Addenda: 0 Note Initiated On: 05/01/2022 12:19 PM Scope Withdrawal Time: 0 hours 28 minutes 52 seconds  Total Procedure Duration: 0 hours 47 minutes 52 seconds  Estimated Blood Loss:  Estimated blood loss was minimal.      [facility]

## 2022-05-02 ENCOUNTER — Encounter: Payer: Self-pay | Admitting: Gastroenterology

## 2022-05-03 NOTE — Anesthesia Postprocedure Evaluation (Signed)
Anesthesia Post Note  Patient: Jared Whitaker.  Procedure(s) Performed: COLONOSCOPY  Patient location during evaluation: PACU Anesthesia Type: General Level of consciousness: awake and oriented Pain management: satisfactory to patient Vital Signs Assessment: post-procedure vital signs reviewed and stable Respiratory status: spontaneous breathing and nonlabored ventilation Cardiovascular status: stable Anesthetic complications: no   No notable events documented.   Last Vitals:  Vitals:   05/01/22 1402 05/01/22 1412  BP: 125/64 (!) 114/57  Pulse: 68   Resp: 18 15  Temp:    SpO2: 99% 99%    Last Pain:  Vitals:   05/01/22 1412  TempSrc:   PainSc: 0-No pain                 VAN STAVEREN,Jalen Oberry

## 2022-05-04 LAB — SURGICAL PATHOLOGY

## 2022-05-09 ENCOUNTER — Other Ambulatory Visit: Payer: Self-pay | Admitting: Primary Care

## 2022-07-04 DIAGNOSIS — E785 Hyperlipidemia, unspecified: Secondary | ICD-10-CM | POA: Diagnosis not present

## 2022-07-04 DIAGNOSIS — Z6833 Body mass index (BMI) 33.0-33.9, adult: Secondary | ICD-10-CM | POA: Diagnosis not present

## 2022-07-04 DIAGNOSIS — J309 Allergic rhinitis, unspecified: Secondary | ICD-10-CM | POA: Diagnosis not present

## 2022-07-04 DIAGNOSIS — I10 Essential (primary) hypertension: Secondary | ICD-10-CM | POA: Diagnosis not present

## 2022-07-04 DIAGNOSIS — I4891 Unspecified atrial fibrillation: Secondary | ICD-10-CM | POA: Diagnosis not present

## 2022-07-04 DIAGNOSIS — J449 Chronic obstructive pulmonary disease, unspecified: Secondary | ICD-10-CM | POA: Diagnosis not present

## 2022-07-04 DIAGNOSIS — D6869 Other thrombophilia: Secondary | ICD-10-CM | POA: Diagnosis not present

## 2022-07-04 DIAGNOSIS — E669 Obesity, unspecified: Secondary | ICD-10-CM | POA: Diagnosis not present

## 2022-07-04 DIAGNOSIS — Z7901 Long term (current) use of anticoagulants: Secondary | ICD-10-CM | POA: Diagnosis not present

## 2022-07-07 ENCOUNTER — Other Ambulatory Visit: Payer: Self-pay | Admitting: Internal Medicine

## 2022-09-28 DIAGNOSIS — Z0000 Encounter for general adult medical examination without abnormal findings: Secondary | ICD-10-CM | POA: Diagnosis not present

## 2022-09-28 DIAGNOSIS — I4891 Unspecified atrial fibrillation: Secondary | ICD-10-CM | POA: Diagnosis not present

## 2022-09-28 DIAGNOSIS — R030 Elevated blood-pressure reading, without diagnosis of hypertension: Secondary | ICD-10-CM | POA: Diagnosis not present

## 2022-09-28 DIAGNOSIS — G4700 Insomnia, unspecified: Secondary | ICD-10-CM | POA: Diagnosis not present

## 2022-09-28 DIAGNOSIS — G4733 Obstructive sleep apnea (adult) (pediatric): Secondary | ICD-10-CM | POA: Diagnosis not present

## 2022-09-28 DIAGNOSIS — Z23 Encounter for immunization: Secondary | ICD-10-CM | POA: Diagnosis not present

## 2022-09-28 DIAGNOSIS — D649 Anemia, unspecified: Secondary | ICD-10-CM | POA: Diagnosis not present

## 2022-09-28 DIAGNOSIS — E785 Hyperlipidemia, unspecified: Secondary | ICD-10-CM | POA: Diagnosis not present

## 2022-10-03 ENCOUNTER — Other Ambulatory Visit: Payer: Self-pay | Admitting: Internal Medicine

## 2022-10-03 DIAGNOSIS — I4821 Permanent atrial fibrillation: Secondary | ICD-10-CM

## 2022-10-08 NOTE — Telephone Encounter (Signed)
Xarelto '20mg'$  refill request received. Pt is 76 years old, weight-97.1kg, Crea-1.20 on 03/26/2022 via [facility] from [place], last seen by Dr. Quentin Ore on 02/14/2022, Diagnosis-Afib, CrCl-69.68 mL/min; Dose is appropriate based on dosing criteria. Will send in refill to requested pharmacy.

## 2022-10-18 ENCOUNTER — Encounter: Payer: Self-pay | Admitting: Internal Medicine

## 2022-10-18 ENCOUNTER — Ambulatory Visit: Payer: Medicare HMO | Attending: Internal Medicine | Admitting: Internal Medicine

## 2022-10-18 VITALS — BP 120/70 | HR 49 | Ht 67.0 in | Wt 210.0 lb

## 2022-10-18 DIAGNOSIS — G4733 Obstructive sleep apnea (adult) (pediatric): Secondary | ICD-10-CM

## 2022-10-18 DIAGNOSIS — I6523 Occlusion and stenosis of bilateral carotid arteries: Secondary | ICD-10-CM

## 2022-10-18 DIAGNOSIS — R5382 Chronic fatigue, unspecified: Secondary | ICD-10-CM | POA: Diagnosis not present

## 2022-10-18 DIAGNOSIS — I4821 Permanent atrial fibrillation: Secondary | ICD-10-CM

## 2022-10-18 NOTE — Patient Instructions (Signed)
Medication Instructions:  Your Physician recommend you continue on your current medication as directed.    *If you need a refill on your cardiac medications before your next appointment, please call your pharmacy*   Lab Work: None ordered today   Testing/Procedures: None ordered today   Follow-Up: At [facility], you and your health needs are our priority.  As part of our continuing mission to provide you with exceptional heart care, we have created designated Provider Care Teams.  These Care Teams include your primary Cardiologist (physician) and Advanced Practice Providers (APPs -  Physician Assistants and Nurse Practitioners) who all work together to provide you with the care you need, when you need it.  We recommend signing up for the patient portal called "MyChart".  Sign up information is provided on this After Visit Summary.  MyChart is used to connect with patients for Virtual Visits (Telemedicine).  Patients are able to view lab/test results, encounter notes, upcoming appointments, etc.  Non-urgent messages can be sent to your provider as well.   To learn more about what you can do with MyChart, go to [email or web address].    Your next appointment:   1 year(s)  The format for your next appointment:   In Person  Provider:   You may see Dr.End or one of the following Advanced Practice Providers on your designated Care Team:   Murray Hodgkins, NP Christell Faith, PA-C Cadence Kathlen Mody, PA-C Gerrie Nordmann, NP

## 2022-10-18 NOTE — Progress Notes (Signed)
Follow-up Outpatient Visit Date: 10/18/2022  Primary Care Provider: Leonel Ramsay, MD [place] [address]  Chief Complaint: Sloan Leiter  HPI:  Mr. Kunzler is a 76 y.o. 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  He was last seen in our office on 10/03/2021 by Christell Faith, PA, at which time he was doing fairly well though he continued to have intermittent exertional dyspnea.  He subsequently followed with Dr. Quentin Ore in the EP clinic, having last been seen in April.  He was feeling well at that time with recommendations to continue rate control strategy and indefinite anticoagulation.  Today, Mr. Cordaro reports that he feels tired all the time.  He attributes this to his atrial fibrillation.  He was previously diagnosed with mild obstructive sleep apnea in 2021 but declined CPAP.  He is not interested in repeating a sleep study.  He has not had any chest pain, shortness of breath, palpitations, lightheadedness, or edema.  He has noticed a couple episodes of double vision but has not mentioned it to his eye doctor.  He otherwise denies focal neurologic changes.  He remains compliant with rivaroxaban.  He notes that he has a history of carotid artery disease and inquires about repeating a carotid Doppler.  He was seen in the past by [facility] in [place] but prefers follow-up through our office if possible.  --------------------------------------------------------------------------------------------------  Past Medical History:  Diagnosis Date   A-fib ([place])    Anemia    Anemia    Asthma    Carotid stenosis    Colon polyp    Tubular adenoma- in colonoscopy in 07/2006 followed by GI Dr Armanda Magic dermatitis    Dilated  aortic root ([place])    ascending aorta 77m by echo 07/2017   Hyperlipidemia    pateint denies this   Hypertension    patient denies this   Normal cardiac stress test    Osteoarthritis    of the shoulders   Overweight    Persistent atrial fibrillation ([place]    chads2vasc score is 1-2 (pt denies HTN)   Shortness of breath    Sleep apnea    Snoring    w/u for apnea is in progress   Stroke ([facility]    Past Surgical History:  Procedure Laterality Date   CARDIOVERSION N/A 03/03/2014   Procedure: CARDIOVERSION;  Surgeon: JCarlena Bjornstad MD;  [facility]  Service: Cardiovascular;  Laterality: N/A;   CARDIOVERSION N/A 01/31/2015   Procedure: CARDIOVERSION;  Surgeon: JCarlena Bjornstad MD;  [facility]  Service: Cardiovascular;  Laterality: N/A;   COLONOSCOPY     COLONOSCOPY     x3   COLONOSCOPY N/A 05/01/2022   Procedure: COLONOSCOPY;  Surgeon: RAnnamaria Helling DO;  [facility]  Service: Gastroenterology;  Laterality: N/A;   left leg fracture  11/13/1967    Current Meds  Medication Sig   albuterol (VENTOLIN HFA) 108 (90 Base) MCG/ACT inhaler Inhale 2 puffs into the lungs every 6 (six) hours as needed for wheezing or shortness of breath.   azelastine (ASTELIN) 0.1 % nasal spray Place 1 spray into both nostrils 2 (two) times daily. Use in each nostril as directed   betamethasone dipropionate 0.05 % cream Apply topically 2 (two) times  daily.   carboxymethylcellulose (REFRESH PLUS) 0.5 % SOLN 3 (three) times daily as needed   Cyanocobalamin (VITAMIN B 12 PO) Take by mouth. Daily   fluticasone (FLONASE) 50 MCG/ACT nasal spray Place 1 spray into both nostrils daily.   fluticasone furoate-vilanterol (BREO ELLIPTA) 100-25 MCG/INH AEPB Inhale 1 puff into the lungs daily.   furosemide (LASIX) 20 MG tablet TAKE 1 TABLET DAILY AS NEEDED FOR SWELLING, EDEMA, OR SHORTNESS OF BREATH   montelukast (SINGULAIR) 10 MG tablet TAKE 1 TABLET AT BEDTIME   rosuvastatin  (CRESTOR) 5 MG tablet Take 5 mg by mouth daily.   XARELTO 20 MG TABS tablet TAKE 1 TABLET BY MOUTH EVERY DAY WITH SUPPER    Allergies: Penicillins  Social History   Tobacco Use   Smoking status: Never   Smokeless tobacco: Never   Tobacco comments:    Secondhand smoke exposure  Vaping Use   Vaping Use: Never used  Substance Use Topics   Alcohol use: Yes    Alcohol/week: 1.0 - 2.0 standard drink of alcohol    Types: 1 - 2 Cans of beer per week    Comment: 3 six packs a week. no etoh since friday   Drug use: No    Family History  Problem Relation Age of Onset   CVA Mother    Heart attack Mother    Heart attack Father        smoker   Bladder Cancer Father    Cancer Paternal Uncle     Review of Systems: A 12-system review of systems was performed and was negative except as noted in the HPI.  --------------------------------------------------------------------------------------------------  Physical Exam: BP 120/70 (BP Location: Left Arm, Patient Position: Sitting, Cuff Size: Normal)   Pulse (!) 49   Ht '5\' 7"'$  (1.702 m)   Wt 210 lb (95.3 kg)   SpO2 97%   BMI 32.89 kg/m  Ambulating: HR 85 bpm  General:  NAD. Neck: No JVD or HJR. Lungs: Clear to auscultation bilaterally without wheezes or crackles. Heart: Bradycardic and irregularly irregular rhythm without murmurs, rubs, or gallops. Abdomen: Soft, nontender, nondistended. Extremities: No lower extremity edema.  EKG: Atrial fibrillation with slow ventricular response and incomplete right bundle branch block.  No significant change since prior tracing on 02/14/2022.  Lab Results  Component Value Date   WBC 7.4 10/16/2018   HGB 12.7 (L) 10/16/2018   HCT 38.5 10/16/2018   MCV 98 (H) 10/16/2018   PLT 274 10/16/2018    Lab Results  Component Value Date   NA 139 12/08/2019   K 4.5 12/08/2019   CL 104 12/08/2019   CO2 23 12/08/2019   BUN 12 12/08/2019   CREATININE 1.12 12/08/2019   GLUCOSE 91 12/08/2019   ALT  17 12/08/2019    --------------------------------------------------------------------------------------------------  ASSESSMENT AND PLAN: Permanent atrial fibrillation: Mr. Wetzel Bjornstad continues to have resting bradycardia in the setting of his atrial fibrillation, though he exhibits an appropriate rise in heart rate with ambulation.  He believes his chronic fatigue is related to his atrial fibrillation.  He was previously evaluated by Dr. Quentin Ore, who recommended continued rate control strategy.  He is not on any AV nodal blocking agents.  Will continue anticoagulation with apixaban.  Carotid artery stenosis: Mr. Sloane notes a few episodes of transient double vision but no frank amaurosis fugax or other focal neurologic changes.  Most recent carotid Doppler in 05/2021 showed moderate right and mild left ICA disease.  We will plan to repeat a carotid Doppler  in our office at Mr. Barfield' convenience.  Continue low-dose rosuvastatin.  Defer aspirin in the setting of long-term anticoagulation with rivaroxaban.  Fatigue and obstructive sleep apnea: Mr. Hagood notes chronic fatigue in the setting of mild OSA diagnosed in 2021.  He was not interested in CPAP at that time.  We discussed at length the potential for worsening OSA contributing to his symptoms.  Mr. Witzke is not interested in repeating a sleep study at this time.  I will defer further workup of his fatigue to his PCP.  Follow-up: Return to clinic in 1 year.  Nelva Bush, MD 10/18/2022 1:46 PM

## 2022-10-20 ENCOUNTER — Encounter: Payer: Self-pay | Admitting: Internal Medicine

## 2022-10-20 DIAGNOSIS — G4733 Obstructive sleep apnea (adult) (pediatric): Secondary | ICD-10-CM | POA: Insufficient documentation

## 2022-10-20 DIAGNOSIS — R5382 Chronic fatigue, unspecified: Secondary | ICD-10-CM | POA: Insufficient documentation

## 2022-10-22 ENCOUNTER — Telehealth: Payer: Self-pay

## 2022-10-22 DIAGNOSIS — I6523 Occlusion and stenosis of bilateral carotid arteries: Secondary | ICD-10-CM

## 2022-10-22 NOTE — Telephone Encounter (Signed)
Carotid dopplers orders placed per Dr. Saunders Jared Whitaker made aware and message sent to scheduling

## 2022-12-13 ENCOUNTER — Ambulatory Visit: Payer: Medicare HMO | Attending: Internal Medicine

## 2022-12-13 DIAGNOSIS — I6523 Occlusion and stenosis of bilateral carotid arteries: Secondary | ICD-10-CM | POA: Diagnosis not present

## 2023-01-30 ENCOUNTER — Telehealth: Payer: Self-pay | Admitting: Surgery

## 2023-01-30 NOTE — Telephone Encounter (Signed)
swp, stated he is coninuting vascular care in [place]. would like to discontinue care with [facility] [place] .

## 2023-02-11 DIAGNOSIS — Z961 Presence of intraocular lens: Secondary | ICD-10-CM | POA: Diagnosis not present

## 2023-02-11 DIAGNOSIS — H524 Presbyopia: Secondary | ICD-10-CM | POA: Diagnosis not present

## 2023-02-11 DIAGNOSIS — H52203 Unspecified astigmatism, bilateral: Secondary | ICD-10-CM | POA: Diagnosis not present

## 2023-03-31 ENCOUNTER — Other Ambulatory Visit: Payer: Self-pay | Admitting: Internal Medicine

## 2023-03-31 DIAGNOSIS — I4821 Permanent atrial fibrillation: Secondary | ICD-10-CM

## 2023-04-01 DIAGNOSIS — Z79899 Other long term (current) drug therapy: Secondary | ICD-10-CM | POA: Diagnosis not present

## 2023-04-01 DIAGNOSIS — G4733 Obstructive sleep apnea (adult) (pediatric): Secondary | ICD-10-CM | POA: Diagnosis not present

## 2023-04-01 DIAGNOSIS — G4700 Insomnia, unspecified: Secondary | ICD-10-CM | POA: Diagnosis not present

## 2023-04-01 DIAGNOSIS — I4891 Unspecified atrial fibrillation: Secondary | ICD-10-CM | POA: Diagnosis not present

## 2023-04-01 DIAGNOSIS — Z0000 Encounter for general adult medical examination without abnormal findings: Secondary | ICD-10-CM | POA: Diagnosis not present

## 2023-04-01 DIAGNOSIS — Z1331 Encounter for screening for depression: Secondary | ICD-10-CM | POA: Diagnosis not present

## 2023-04-01 DIAGNOSIS — Z9229 Personal history of other drug therapy: Secondary | ICD-10-CM | POA: Diagnosis not present

## 2023-04-01 DIAGNOSIS — E785 Hyperlipidemia, unspecified: Secondary | ICD-10-CM | POA: Diagnosis not present

## 2023-04-01 DIAGNOSIS — D649 Anemia, unspecified: Secondary | ICD-10-CM | POA: Diagnosis not present

## 2023-04-01 NOTE — Telephone Encounter (Signed)
Refill request

## 2023-04-01 NOTE — Telephone Encounter (Signed)
Prescription refill request for Xarelto received.  Indication:afib Last office visit:2/24 Weight:95.3  kg Age:77 Scr:1.2 CrCl:68.39  ml/min  Prescription refilled

## 2023-05-20 ENCOUNTER — Encounter: Payer: Self-pay | Admitting: Adult Health

## 2023-05-20 ENCOUNTER — Ambulatory Visit: Payer: Medicare HMO | Admitting: Adult Health

## 2023-05-20 VITALS — BP 118/64 | HR 70 | Temp 98.2°F | Ht 67.0 in | Wt 219.6 lb

## 2023-05-20 DIAGNOSIS — G4700 Insomnia, unspecified: Secondary | ICD-10-CM

## 2023-05-20 DIAGNOSIS — J4530 Mild persistent asthma, uncomplicated: Secondary | ICD-10-CM

## 2023-05-20 DIAGNOSIS — J309 Allergic rhinitis, unspecified: Secondary | ICD-10-CM | POA: Diagnosis not present

## 2023-05-20 NOTE — Assessment & Plan Note (Signed)
Healthy sleep regimen discussed.  Melatonin as needed. 

## 2023-05-20 NOTE — Patient Instructions (Signed)
Try Claritin 10mg  At bedtime  As needed  drainage.  Saline nasal rinses As needed   Continue on Flonase and Astelin nasal As needed   Continue on Singulair daily  Melatonin At bedtime  As needed  insomnia  Breo 1 puff daily as discussed.  Follow up in 1 year and As needed

## 2023-05-20 NOTE — Progress Notes (Signed)
@Patient  ID: Jared Whitaker., male    DOB: 18-Feb-1946, 77 y.o.   MRN: [id number]  Chief Complaint  Patient presents with   Follow-up    Referring provider: Mick Sell, MD  HPI: 77 year old male never smoker followed for asthma with allergic phenotype and allergic rhinitis Has history of A fib on Xarelto.   TEST/EVENTS :  PFT 07/18/21 >> AHI 1.80 (68%), FEV1% 75, TLC 5.14 (79%), DLCO 102%, +BD   Sleep Tests:  HST 01/11/20 >> AHI 7.3, SpO2 low 91%   Cardiac Tests:  Echo 07/07/21 >> EF 60 to 65%, mild LA dilation, mod MR, aortic root 37 mm   05/20/2023 Follow up ; Asthma and AR  Patient presents for a follow-up visit.  Last seen November 2022.  Patient has underlying asthma with allergic phenotype and allergic rhinitis.  Patient says overall breathing has been doing well. Remains on BREO, does not take on regular basis .  Take  Flonase. Astelin and Singular daily.  No increased albuterol use. Does have some drainage in throat on and off.  Has trouble falling asleep and staying asleep on and off. Has history of mild OSA. He declines CPAP . Takes Melatonin most nights.       Allergies  Allergen Reactions   Penicillins Rash    Purple spots, swelling    Immunization History  Administered Date(s) Administered   Dtap, Unspecified 08/12/2020   Influenza, High Dose Seasonal PF 10/12/2016, 09/20/2018   Influenza-Unspecified 09/27/2017   Zoster Recombinant(Shingrix) 09/20/2018   Zoster, Live 11/12/2017    Past Medical History:  Diagnosis Date   A-fib ([facility])    Anemia    Anemia    Asthma    Carotid stenosis    Colon polyp    Tubular adenoma- in colonoscopy in 07/2006 followed by GI Dr Valerie Salts dermatitis    Dilated aortic root ([facility])    ascending aorta 40mm by echo 07/2017   Hyperlipidemia    pateint denies this   Hypertension    patient denies this   Normal cardiac stress test    Osteoarthritis    of the shoulders   Overweight    Persistent atrial  fibrillation ([facility])    chads2vasc score is 1-2 (pt denies HTN)   Shortness of breath    Sleep apnea    Snoring    w/u for apnea is in progress   Stroke ([facility])     Tobacco History: Social History   Tobacco Use  Smoking Status Never  Smokeless Tobacco Never  Tobacco Comments   Secondhand smoke exposure   Counseling given: Not Answered Tobacco comments: Secondhand smoke exposure   Outpatient Medications Prior to Visit  Medication Sig Dispense Refill   albuterol (VENTOLIN HFA) 108 (90 Base) MCG/ACT inhaler Inhale 2 puffs into the lungs every 6 (six) hours as needed for wheezing or shortness of breath. 8 g 5   azelastine (ASTELIN) 0.1 % nasal spray Place 1 spray into both nostrils 2 (two) times daily. Use in each nostril as directed 30 mL 12   carboxymethylcellulose (REFRESH PLUS) 0.5 % SOLN 3 (three) times daily as needed     Cyanocobalamin (VITAMIN B 12 PO) Take by mouth. Daily     fluticasone (FLONASE) 50 MCG/ACT nasal spray Place 1 spray into both nostrils daily. 16 g 2   fluticasone furoate-vilanterol (BREO ELLIPTA) 100-25 MCG/INH AEPB Inhale 1 puff into the lungs daily. 180 each 3   furosemide (LASIX) 20 MG tablet TAKE  1 TABLET DAILY AS NEEDED FOR SWELLING, EDEMA, OR SHORTNESS OF BREATH 90 tablet 3   montelukast (SINGULAIR) 10 MG tablet TAKE 1 TABLET AT BEDTIME 90 tablet 3   rosuvastatin (CRESTOR) 5 MG tablet Take 5 mg by mouth daily.     XARELTO 20 MG TABS tablet TAKE 1 TABLET BY MOUTH EVERY DAY WITH SUPPER 90 tablet 1   No facility-administered medications prior to visit.     Review of Systems:   Constitutional:   No  weight loss, night sweats,  Fevers, chills, fatigue, or  lassitude.  HEENT:   No headaches,  Difficulty swallowing,  Tooth/dental problems, or  Sore throat,                No sneezing, itching, ear ache,  +nasal congestion, post nasal drip,   CV:  No chest pain,  Orthopnea, PND, swelling in lower extremities, anasarca, dizziness, palpitations, syncope.    GI  No heartburn, indigestion, abdominal pain, nausea, vomiting, diarrhea, change in bowel habits, loss of appetite, bloody stools.   Resp: No shortness of breath with exertion or at rest.  No excess mucus, no productive cough,  No non-productive cough,  No coughing up of blood.  No change in color of mucus.  No wheezing.  No chest wall deformity  Skin: no rash or lesions.  GU: no dysuria, change in color of urine, no urgency or frequency.  No flank pain, no hematuria   MS:  No joint pain or swelling.  No decreased range of motion.  No back pain.    Physical Exam  BP 118/64 (BP Location: Left Arm, Cuff Size: Normal)   Pulse 70   Temp 98.2 F (36.8 C) (Temporal)   Ht 5\' 7"  (1.702 m)   Wt 219 lb 9.6 oz (99.6 kg)   SpO2 97%   BMI 34.39 kg/m   GEN: A/Ox3; pleasant , NAD, well nourished    HEENT:  /AT,  NOSE-clear, THROAT-clear, no lesions, no postnasal drip or exudate noted.   NECK:  Supple w/ fair ROM; no JVD; normal carotid impulses w/o bruits; no thyromegaly or nodules palpated; no lymphadenopathy.    RESP  Clear  P & A; w/o, wheezes/ rales/ or rhonchi. no accessory muscle use, no dullness to percussion  CARD:  RRR, no m/r/g, no peripheral edema, pulses intact, no cyanosis or clubbing.  GI:   Soft & nt; nml bowel sounds; no organomegaly or masses detected.   Musco: Warm bil, no deformities or joint swelling noted.   Neuro: alert, no focal deficits noted.    Skin: Warm, no lesions or rashes    Lab Results:  CBC   BNP    Component Value Date/Time   BNP 32.6 07/17/2016 1519    ProBNP No results found for: "PROBNP"  Imaging: No results found.       Latest Ref Rng & Units 07/18/2021    1:44 PM 08/14/2017    9:39 AM 07/31/2016    3:00 PM  PFT Results  FVC-Pre L 2.07  2.48  2.27   FVC-Predicted Pre % 56  65  59   FVC-Post L 2.39  2.58  2.63   FVC-Predicted Post % 65  67  68   Pre FEV1/FVC % % 70  69  68   Post FEV1/FCV % % 75  72  73   FEV1-Pre L  1.46  1.71  1.54   FEV1-Predicted Pre % 55  61  54   FEV1-Post L 1.80  1.87  1.91   DLCO uncorrected ml/min/mmHg 23.26   24.80   DLCO UNC% % 102   87   DLCO corrected ml/min/mmHg 23.26     DLCO COR %Predicted % 102     DLVA Predicted % 133   122   TLC L 5.14   5.42   TLC % Predicted % 79   84   RV % Predicted % 112   126     Lab Results  Component Value Date   NITRICOXIDE 56 07/18/2021        Assessment & Plan:   Mild persistent allergic asthma Currently under good control.  Continue on trigger prevention.  We discussed asthma action plan.  Breo on a more regular basis when symptoms increase.  Plan  Patient Instructions  Try Claritin 10mg  At bedtime  As needed  drainage.  Saline nasal rinses As needed   Continue on Flonase and Astelin nasal As needed   Continue on Singulair daily  Melatonin At bedtime  As needed  insomnia  Breo 1 puff daily as discussed.  Follow up in 1 year and As needed      Insomnia Healthy sleep regimen discussed.  Melatonin as needed.  Allergic rhinitis Continue on current regimen     Rubye Oaks, NP 05/20/2023

## 2023-05-20 NOTE — Assessment & Plan Note (Signed)
Continue on current regimen .   

## 2023-05-20 NOTE — Assessment & Plan Note (Signed)
Currently under good control.  Continue on trigger prevention.  We discussed asthma action plan.  Breo on a more regular basis when symptoms increase.  Plan  Patient Instructions  Try Claritin 10mg  At bedtime  As needed  drainage.  Saline nasal rinses As needed   Continue on Flonase and Astelin nasal As needed   Continue on Singulair daily  Melatonin At bedtime  As needed  insomnia  Breo 1 puff daily as discussed.  Follow up in 1 year and As needed

## 2023-05-21 NOTE — Progress Notes (Signed)
Reviewed and agree with assessment/plan.   Coralyn Helling, MD [facility] Pulmonary/Critical Care 05/21/2023, 11:09 AM Pager:  [phone]

## 2023-07-10 ENCOUNTER — Other Ambulatory Visit: Payer: Self-pay | Admitting: Internal Medicine

## 2023-07-29 ENCOUNTER — Other Ambulatory Visit: Payer: Self-pay | Admitting: Primary Care

## 2023-09-26 DIAGNOSIS — R030 Elevated blood-pressure reading, without diagnosis of hypertension: Secondary | ICD-10-CM | POA: Diagnosis not present

## 2023-09-26 DIAGNOSIS — Z9229 Personal history of other drug therapy: Secondary | ICD-10-CM | POA: Diagnosis not present

## 2023-09-26 DIAGNOSIS — Z23 Encounter for immunization: Secondary | ICD-10-CM | POA: Diagnosis not present

## 2023-09-26 DIAGNOSIS — R1084 Generalized abdominal pain: Secondary | ICD-10-CM | POA: Diagnosis not present

## 2023-09-26 DIAGNOSIS — Z125 Encounter for screening for malignant neoplasm of prostate: Secondary | ICD-10-CM | POA: Diagnosis not present

## 2023-09-26 DIAGNOSIS — I4891 Unspecified atrial fibrillation: Secondary | ICD-10-CM | POA: Diagnosis not present

## 2023-09-26 DIAGNOSIS — R1030 Lower abdominal pain, unspecified: Secondary | ICD-10-CM | POA: Diagnosis not present

## 2023-09-26 DIAGNOSIS — N1831 Chronic kidney disease, stage 3a: Secondary | ICD-10-CM | POA: Diagnosis not present

## 2023-09-26 DIAGNOSIS — I482 Chronic atrial fibrillation, unspecified: Secondary | ICD-10-CM | POA: Diagnosis not present

## 2023-09-26 DIAGNOSIS — E785 Hyperlipidemia, unspecified: Secondary | ICD-10-CM | POA: Diagnosis not present

## 2023-09-26 DIAGNOSIS — J45909 Unspecified asthma, uncomplicated: Secondary | ICD-10-CM | POA: Diagnosis not present

## 2023-10-09 ENCOUNTER — Other Ambulatory Visit: Payer: Self-pay | Admitting: Internal Medicine

## 2023-10-09 DIAGNOSIS — I4821 Permanent atrial fibrillation: Secondary | ICD-10-CM

## 2023-10-09 NOTE — Telephone Encounter (Signed)
Prescription refill request for Xarelto received.  Indication: AF Last office visit: 10/18/22  C End MD Weight: 95.3kg Age: 77  Scr: 1.3 on 09/26/23  Epic CrCl: 62.11  Based on above findings Xarelto 20mg  daily is the appropriate dose.  Refill approved.  Pt is past due for appt with Dr End.  Message sent to schedulers to make appt.

## 2023-10-14 DIAGNOSIS — I4891 Unspecified atrial fibrillation: Secondary | ICD-10-CM | POA: Diagnosis not present

## 2023-10-14 DIAGNOSIS — D649 Anemia, unspecified: Secondary | ICD-10-CM | POA: Diagnosis not present

## 2023-10-14 DIAGNOSIS — R109 Unspecified abdominal pain: Secondary | ICD-10-CM | POA: Diagnosis not present

## 2023-10-14 DIAGNOSIS — R972 Elevated prostate specific antigen [PSA]: Secondary | ICD-10-CM | POA: Diagnosis not present

## 2023-10-14 DIAGNOSIS — I878 Other specified disorders of veins: Secondary | ICD-10-CM | POA: Diagnosis not present

## 2023-10-14 DIAGNOSIS — J45909 Unspecified asthma, uncomplicated: Secondary | ICD-10-CM | POA: Diagnosis not present

## 2023-10-22 ENCOUNTER — Ambulatory Visit: Payer: Medicare HMO | Attending: Medical | Admitting: Medical

## 2023-10-22 ENCOUNTER — Encounter: Payer: Self-pay | Admitting: Medical

## 2023-10-22 VITALS — HR 59 | Ht 67.0 in | Wt 211.6 lb

## 2023-10-22 DIAGNOSIS — I340 Nonrheumatic mitral (valve) insufficiency: Secondary | ICD-10-CM | POA: Diagnosis not present

## 2023-10-22 DIAGNOSIS — E782 Mixed hyperlipidemia: Secondary | ICD-10-CM | POA: Diagnosis not present

## 2023-10-22 DIAGNOSIS — G4733 Obstructive sleep apnea (adult) (pediatric): Secondary | ICD-10-CM | POA: Diagnosis not present

## 2023-10-22 DIAGNOSIS — I4821 Permanent atrial fibrillation: Secondary | ICD-10-CM | POA: Diagnosis not present

## 2023-10-22 DIAGNOSIS — I77810 Thoracic aortic ectasia: Secondary | ICD-10-CM | POA: Diagnosis not present

## 2023-10-22 DIAGNOSIS — I6523 Occlusion and stenosis of bilateral carotid arteries: Secondary | ICD-10-CM

## 2023-10-22 NOTE — Progress Notes (Unsigned)
Cardiology Office Note:    Date:  10/23/2023   ID:  Jared Whitaker., DOB 03-12-1946, MRN [id number]  PCP:  Jared Sell, MD  [facility] Cardiologist:  Armanda Magic, MD  [facility] Electrophysiologist:  Lanier Prude, MD   Referring MD: Jared Sell, MD   Chief Complaint: 1 year follow-up  History of Present Illness:    Jared Whitaker. is a 77 y.o. 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  Patient was last seen by Dr. Okey Dupre in December 2023 and reported he was feeling tired all the time, which he attributed to A-fib.  He was previously diagnosed with mild obstructive sleep apnea in 2021 but declined CPAP.  He was not interested in repeat the study.  EKG showed resting bradycardia not on AV nodal blocking agents.  Repeat carotid Dopplers were ordered.  Today, the patient is overall doing well. No health changes in the last year. He still has unchanged shortness of breath. He is in afib today with low heart rates. He has not been getting much sleep lately, he wakes up when he has to urinate and has difficulty going back to sleep. He is not wanting to revisit CPAP.  He denies chest pain. He denies lower leg edema. He takes lasix every day.   Past Medical History:  Diagnosis Date   A-fib (HCC)    Anemia    Anemia    Asthma    Carotid stenosis    Colon polyp    Tubular adenoma- in colonoscopy in 07/2006 followed by GI Dr Valerie Salts dermatitis    Dilated aortic root (HCC)    ascending aorta 40mm by echo 07/2017   Hyperlipidemia    pateint denies this   Hypertension    patient denies this   Normal cardiac stress test    Osteoarthritis    of the shoulders   Overweight    Persistent atrial fibrillation (HCC)    chads2vasc score is 1-2  (pt denies HTN)   Shortness of breath    Sleep apnea    Snoring    w/u for apnea is in progress   Stroke [facility])     Past Surgical History:  Procedure Laterality Date   CARDIOVERSION N/A 03/03/2014   Procedure: CARDIOVERSION;  Surgeon: Luis Abed, MD;  Location: [facility] ENDOSCOPY;  Service: Cardiovascular;  Laterality: N/A;   CARDIOVERSION N/A 01/31/2015   Procedure: CARDIOVERSION;  Surgeon: Luis Abed, MD;  Location: [facility] ENDOSCOPY;  Service: Cardiovascular;  Laterality: N/A;   COLONOSCOPY     COLONOSCOPY     x3   COLONOSCOPY N/A 05/01/2022   Procedure: COLONOSCOPY;  Surgeon: Jaynie Collins, DO;  Location: [facility] ENDOSCOPY;  Service: Gastroenterology;  Laterality: N/A;   left leg fracture  11/13/1967    Current Medications: Current Meds  Medication Sig   albuterol (VENTOLIN HFA) 108 (90 Base) MCG/ACT inhaler Inhale 2 puffs into the lungs every 6 (six) hours as needed for wheezing or shortness of breath.   azelastine (ASTELIN) 0.1 % nasal spray Place 1 spray into both nostrils 2 (two) times daily. Use in each nostril as directed   carboxymethylcellulose (REFRESH PLUS) 0.5 % SOLN 3 (three) times daily as needed   Cyanocobalamin (VITAMIN B 12 PO) Take by mouth. Daily  fluticasone (FLONASE) 50 MCG/ACT nasal spray Place 1 spray into both nostrils daily.   fluticasone furoate-vilanterol (BREO ELLIPTA) 100-25 MCG/INH AEPB Inhale 1 puff into the lungs daily.   furosemide (LASIX) 20 MG tablet Take 20 mg by mouth daily.   montelukast (SINGULAIR) 10 MG tablet TAKE 1 TABLET BY MOUTH EVERYDAY AT BEDTIME   rosuvastatin (CRESTOR) 5 MG tablet Take 5 mg by mouth daily.   XARELTO 20 MG TABS tablet TAKE 1 TABLET BY MOUTH EVERY DAY WITH SUPPER   [DISCONTINUED] furosemide (LASIX) 20 MG tablet TAKE 1 TABLET DAILY AS NEEDED FOR SWELLING, EDEMA, OR SHORTNESS OF BREATH (Patient taking differently: Take 20 mg by mouth daily.)     Allergies:   Penicillins   Social History   Socioeconomic History    Marital status: Single    Spouse name: Not on file   Number of children: Not on file   Years of education: Not on file   Highest education level: Not on file  Occupational History   Not on file  Tobacco Use   Smoking status: Never   Smokeless tobacco: Never   Tobacco comments:    Secondhand smoke exposure  Vaping Use   Vaping status: Never Used  Substance and Sexual Activity   Alcohol use: Yes    Alcohol/week: 1.0 - 2.0 standard drink of alcohol    Types: 1 - 2 Cans of beer per week    Comment: 3 six packs a week. no etoh since friday   Drug use: No   Sexual activity: Not on file  Other Topics Concern   Not on file  Social History Narrative   Lives in [place].  Works an a Therapist, art.   Social Determinants of Health   Financial Resource Strain: Low Risk  (10/14/2023)   Received from [facility]   Overall Financial Resource Strain (CARDIA)    Difficulty of Paying Living Expenses: Not hard at all  Food Insecurity: No Food Insecurity (10/14/2023)   Received from [facility]   Hunger Vital Sign    Worried About Running Out of Food in the Last Year: Never true    Ran Out of Food in the Last Year: Never true  Transportation Needs: No Transportation Needs (10/14/2023)   Received from [facility] - [facility]    In the past 12 months, has lack of transportation kept you from medical appointments or from getting medications?: No    Lack of Transportation (Non-Medical): No  Physical Activity: Not on file  Stress: Not on file  Social Connections: Not on file     Family History: The patient's family history includes Bladder Cancer in his father; CVA in his mother; Cancer in his paternal uncle; Heart attack in his father and mother.  ROS:   Please see the history of present illness.     All other systems reviewed and are negative.  EKGs/Labs/Other Studies Reviewed:    The following studies  were reviewed today:  US carotids 12/2022  Summary:  Right Carotid: Velocities in the right ICA are consistent with a 40-59%                 stenosis. Non-hemodynamically significant plaque <50% noted  in                the CCA. The ECA appears <50% stenosed.   Left Carotid: There is no evidence of stenosis in the left ICA.  Non-hemodynamically significant plaque <50% noted in the  CCA. The                ECA appears <50% stenosed.   Vertebrals:  Bilateral vertebral arteries demonstrate antegrade flow.  Subclavians: Normal flow hemodynamics were seen in bilateral subclavian               arteries.   *See table(s) above for measurements and observations.  Suggest follow up study in 12 months.    Electronically signed by Dina Rich MD on 12/16/2022 at 7:47:24 PM.   Myoview lexiscan 10/2021   The study is normal. The study is low risk.   No ST deviation was noted.   LV perfusion is normal. There is no evidence of ischemia. There is no evidence of infarction.   Left ventricular function is normal. End diastolic cavity size is normal. End systolic cavity size is normal.   CT attenuation images with evidence of aortic and coronary calcifications.   Echo 2022 1. Left ventricular ejection fraction, by estimation, is 60 to 65%. The  left ventricle has normal function. The left ventricle has no regional  wall motion abnormalities. Left ventricular diastolic parameters are  indeterminate.   2. Right ventricular systolic function is normal. The right ventricular  size is normal.   3. Left atrial size was mildly dilated.   4. The mitral valve is normal in structure. Moderate mitral valve  regurgitation. No evidence of mitral stenosis.   5. The aortic valve is normal in structure. Aortic valve regurgitation is  mild.   6. There is mild dilatation of the ascending aorta, measuring 37 mm.   Comparison(s): Prior echo showed an EF of 50-55%, no RWMA, moderate  biatrial  enlargement and ascending aorta dilatation measuring 39 mm.    EKG:  EKG is ordered today.  The ekg ordered today demonstrates Afib 49-53bpm, pause 2.3 seconds, nonspecific St/t wave changes  Recent Labs: No results found for requested labs within last 365 days.  Recent Lipid Panel No results found for: "CHOL", "TRIG", "HDL", "CHOLHDL", "VLDL", "LDLCALC", "LDLDIRECT"   Physical Exam:    VS:  Pulse (!) 59   Ht 5\' 7"  (1.702 m)   Wt 211 lb 9.6 oz (96 kg)   SpO2 97%   BMI 33.14 kg/m     Wt Readings from Last 3 Encounters:  10/22/23 211 lb 9.6 oz (96 kg)  05/20/23 219 lb 9.6 oz (99.6 kg)  10/18/22 210 lb (95.3 kg)     GEN:  Well nourished, well developed in no acute distress HEENT: Normal NECK: No JVD; No carotid bruits LYMPHATICS: No lymphadenopathy CARDIAC: Irreg Irreg, no murmurs, rubs, gallops RESPIRATORY:  Clear to auscultation without rales, wheezing or rhonchi  ABDOMEN: Soft, non-tender, non-distended MUSCULOSKELETAL:  1+ lower leg edema; No deformity  SKIN: Warm and dry NEUROLOGIC:  Alert and oriented x 3 PSYCHIATRIC:  Normal affect   ASSESSMENT:    1. Permanent atrial fibrillation (HCC)   2. Mitral valve insufficiency, unspecified etiology   3. Obstructive sleep apnea   4. Hyperlipidemia, mixed   5. Bilateral carotid artery stenosis   6. Ascending aorta dilation (HCC)    PLAN:    In order of problems listed above:  Permanent Afib The patient is permanent afib with slow ventricular rates not on AV nodal blocking agents. He feels afib is keeping him from sleeping at night, but more likely it is from CPAP and/or prostate issues. He reports compliance with Xarelto. Continue Xarelto 20mg   daily. He follows with EP.   Mitral regurgitation Echo in 2022 showed EF of 60 to 65%, moderate MR. I will re-check an echocardiogram.   HLD LDL 60. Continue Crestor 5mg  daily.   Mild ascending aorta dilation Re-check echo as above.   Carotid artery stenosis Right ICA  consistent with 40-59% stenosis, non-hemodynamically significant plaque <50% in the CCA. ECA<<50% stenosed. No stenosis in the left ICA, non-hemodynamically significant plaque <50% in the CCA. ECA<50% stenosed. No ASA with Xarelto. Continues Crestor 5mg  daily.   OSA and fatigue This is a chronic issue for the patient. Again, patient was encouraged to follow-up with PCP regarding OSA.   Disposition: Follow up in 1 year(s) with MD/APP    Signed, Addisson Frate David Stall, PA-C  10/23/2023 1:20 PM    [facility]

## 2023-10-22 NOTE — Patient Instructions (Signed)
Medication Instructions:  Your physician recommends that you continue on your current medications as directed. Please refer to the Current Medication list given to you today.   *If you need a refill on your cardiac medications before your next appointment, please call your pharmacy*   Lab Work: No labs ordered today    Testing/Procedures: Your physician has requested that you have an echocardiogram. Echocardiography is a painless test that uses sound waves to create images of your heart. It provides your doctor with information about the size and shape of your heart and how well your heart's chambers and valves are working.   You may receive an ultrasound enhancing agent through an IV if needed to better visualize your heart during the echo. This procedure takes approximately one hour.  There are no restrictions for this procedure.  This will take place at [address] [facility]) [address], [place] [address]  Please note: We ask at that you not bring children with you during ultrasound (echo/ vascular) testing. Due to room size and safety concerns, children are not allowed in the ultrasound rooms during exams. Our front office staff cannot provide observation of children in our lobby area while testing is being conducted. An adult accompanying a patient to their appointment will only be allowed in the ultrasound room at the discretion of the ultrasound technician under special circumstances. We apologize for any inconvenience.    Follow-Up: At [facility], you and your health needs are our priority.  As part of our continuing mission to provide you with exceptional heart care, we have created designated Provider Care Teams.  These Care Teams include your primary Cardiologist (physician) and Advanced Practice Providers (APPs -  Physician Assistants and Nurse Practitioners) who all work together to provide you with the care you need, when you need it.  We recommend  signing up for the patient portal called "MyChart".  Sign up information is provided on this After Visit Summary.  MyChart is used to connect with patients for Virtual Visits (Telemedicine).  Patients are able to view lab/test results, encounter notes, upcoming appointments, etc.  Non-urgent messages can be sent to your provider as well.   To learn more about what you can do with MyChart, go to [email or web address].    Your next appointment:   1 year(s)  Provider:   You may see Yvonne Kendall, MD or one of the following Advanced Practice Providers on your designated Care Team:   Nicolasa Ducking, NP Eula Listen, PA-C Cadence Fransico Michael, PA-C Charlsie Quest, NP Carlos Levering, NP

## 2023-11-07 ENCOUNTER — Inpatient Hospital Stay
Admission: EM | Admit: 2023-11-07 | Discharge: 2023-11-13 | DRG: 064 | Disposition: E | Payer: Medicare HMO | Attending: Pulmonary Disease | Admitting: Pulmonary Disease

## 2023-11-07 ENCOUNTER — Other Ambulatory Visit: Payer: Self-pay

## 2023-11-07 ENCOUNTER — Emergency Department: Payer: Medicare HMO

## 2023-11-07 DIAGNOSIS — I10 Essential (primary) hypertension: Secondary | ICD-10-CM | POA: Diagnosis present

## 2023-11-07 DIAGNOSIS — G935 Compression of brain: Secondary | ICD-10-CM | POA: Diagnosis present

## 2023-11-07 DIAGNOSIS — G936 Cerebral edema: Secondary | ICD-10-CM | POA: Diagnosis present

## 2023-11-07 DIAGNOSIS — Z8249 Family history of ischemic heart disease and other diseases of the circulatory system: Secondary | ICD-10-CM

## 2023-11-07 DIAGNOSIS — J9601 Acute respiratory failure with hypoxia: Secondary | ICD-10-CM | POA: Diagnosis present

## 2023-11-07 DIAGNOSIS — I4510 Unspecified right bundle-branch block: Secondary | ICD-10-CM | POA: Diagnosis present

## 2023-11-07 DIAGNOSIS — I77810 Thoracic aortic ectasia: Secondary | ICD-10-CM | POA: Diagnosis present

## 2023-11-07 DIAGNOSIS — E785 Hyperlipidemia, unspecified: Secondary | ICD-10-CM | POA: Diagnosis present

## 2023-11-07 DIAGNOSIS — Z79899 Other long term (current) drug therapy: Secondary | ICD-10-CM

## 2023-11-07 DIAGNOSIS — Z7951 Long term (current) use of inhaled steroids: Secondary | ICD-10-CM

## 2023-11-07 DIAGNOSIS — J9602 Acute respiratory failure with hypercapnia: Secondary | ICD-10-CM | POA: Diagnosis present

## 2023-11-07 DIAGNOSIS — Z515 Encounter for palliative care: Secondary | ICD-10-CM

## 2023-11-07 DIAGNOSIS — Z4682 Encounter for fitting and adjustment of non-vascular catheter: Secondary | ICD-10-CM | POA: Diagnosis not present

## 2023-11-07 DIAGNOSIS — I6529 Occlusion and stenosis of unspecified carotid artery: Secondary | ICD-10-CM | POA: Diagnosis present

## 2023-11-07 DIAGNOSIS — R55 Syncope and collapse: Secondary | ICD-10-CM | POA: Diagnosis not present

## 2023-11-07 DIAGNOSIS — G4733 Obstructive sleep apnea (adult) (pediatric): Secondary | ICD-10-CM | POA: Diagnosis present

## 2023-11-07 DIAGNOSIS — R930 Abnormal findings on diagnostic imaging of skull and head, not elsewhere classified: Secondary | ICD-10-CM | POA: Diagnosis not present

## 2023-11-07 DIAGNOSIS — Z8052 Family history of malignant neoplasm of bladder: Secondary | ICD-10-CM

## 2023-11-07 DIAGNOSIS — I615 Nontraumatic intracerebral hemorrhage, intraventricular: Secondary | ICD-10-CM | POA: Diagnosis present

## 2023-11-07 DIAGNOSIS — Z7901 Long term (current) use of anticoagulants: Secondary | ICD-10-CM

## 2023-11-07 DIAGNOSIS — Z880 Allergy status to penicillin: Secondary | ICD-10-CM

## 2023-11-07 DIAGNOSIS — N179 Acute kidney failure, unspecified: Secondary | ICD-10-CM | POA: Diagnosis present

## 2023-11-07 DIAGNOSIS — I2510 Atherosclerotic heart disease of native coronary artery without angina pectoris: Secondary | ICD-10-CM | POA: Diagnosis present

## 2023-11-07 DIAGNOSIS — J4489 Other specified chronic obstructive pulmonary disease: Secondary | ICD-10-CM | POA: Diagnosis present

## 2023-11-07 DIAGNOSIS — I613 Nontraumatic intracerebral hemorrhage in brain stem: Secondary | ICD-10-CM | POA: Diagnosis not present

## 2023-11-07 DIAGNOSIS — R569 Unspecified convulsions: Secondary | ICD-10-CM | POA: Diagnosis not present

## 2023-11-07 DIAGNOSIS — N183 Chronic kidney disease, stage 3 unspecified: Secondary | ICD-10-CM | POA: Diagnosis present

## 2023-11-07 DIAGNOSIS — I517 Cardiomegaly: Secondary | ICD-10-CM | POA: Diagnosis not present

## 2023-11-07 DIAGNOSIS — I4819 Other persistent atrial fibrillation: Secondary | ICD-10-CM | POA: Diagnosis present

## 2023-11-07 DIAGNOSIS — G4489 Other headache syndrome: Secondary | ICD-10-CM | POA: Diagnosis not present

## 2023-11-07 DIAGNOSIS — Z8673 Personal history of transient ischemic attack (TIA), and cerebral infarction without residual deficits: Secondary | ICD-10-CM

## 2023-11-07 DIAGNOSIS — R519 Headache, unspecified: Secondary | ICD-10-CM | POA: Diagnosis not present

## 2023-11-07 DIAGNOSIS — J45909 Unspecified asthma, uncomplicated: Secondary | ICD-10-CM | POA: Diagnosis present

## 2023-11-07 DIAGNOSIS — Z66 Do not resuscitate: Secondary | ICD-10-CM | POA: Diagnosis not present

## 2023-11-07 DIAGNOSIS — R6889 Other general symptoms and signs: Secondary | ICD-10-CM | POA: Diagnosis not present

## 2023-11-07 DIAGNOSIS — W19XXXA Unspecified fall, initial encounter: Secondary | ICD-10-CM | POA: Diagnosis not present

## 2023-11-07 DIAGNOSIS — Z823 Family history of stroke: Secondary | ICD-10-CM

## 2023-11-07 DIAGNOSIS — R464 Slowness and poor responsiveness: Secondary | ICD-10-CM | POA: Diagnosis not present

## 2023-11-07 DIAGNOSIS — R092 Respiratory arrest: Secondary | ICD-10-CM | POA: Diagnosis not present

## 2023-11-07 DIAGNOSIS — E876 Hypokalemia: Secondary | ICD-10-CM | POA: Diagnosis present

## 2023-11-07 DIAGNOSIS — I469 Cardiac arrest, cause unspecified: Secondary | ICD-10-CM | POA: Diagnosis not present

## 2023-11-07 LAB — COMPREHENSIVE METABOLIC PANEL
ALT: 19 U/L (ref 0–44)
AST: 37 U/L (ref 15–41)
Albumin: 4.3 g/dL (ref 3.5–5.0)
Alkaline Phosphatase: 78 U/L (ref 38–126)
Anion gap: 18 — ABNORMAL HIGH (ref 5–15)
BUN: 18 mg/dL (ref 8–23)
CO2: 18 mmol/L — ABNORMAL LOW (ref 22–32)
Calcium: 9.2 mg/dL (ref 8.9–10.3)
Chloride: 102 mmol/L (ref 98–111)
Creatinine, Ser: 1.45 mg/dL — ABNORMAL HIGH (ref 0.61–1.24)
GFR, Estimated: 49 mL/min — ABNORMAL LOW (ref >60)
Glucose, Bld: 151 mg/dL — ABNORMAL HIGH (ref 70–99)
Potassium: 3 mmol/L — ABNORMAL LOW (ref 3.5–5.1)
Sodium: 138 mmol/L (ref 135–145)
Total Bilirubin: 0.7 mg/dL (ref <1.2)
Total Protein: 7.7 g/dL (ref 6.5–8.1)

## 2023-11-07 LAB — URINALYSIS, ROUTINE W REFLEX MICROSCOPIC
Bilirubin Urine: NEGATIVE
Glucose, UA: NEGATIVE mg/dL
Ketones, ur: NEGATIVE mg/dL
Leukocytes,Ua: NEGATIVE
Nitrite: NEGATIVE
Protein, ur: 100 mg/dL — AB
Specific Gravity, Urine: 1.011 (ref 1.005–1.030)
pH: 5 (ref 5.0–8.0)

## 2023-11-07 LAB — BLOOD GAS, ARTERIAL
Acid-base deficit: 15.9 mmol/L — ABNORMAL HIGH (ref 0.0–2.0)
Bicarbonate: 14.9 mmol/L — ABNORMAL LOW (ref 20.0–28.0)
FIO2: 100 %
MECHVT: 500 mL
Mechanical Rate: 20
O2 Saturation: 100 %
PEEP: 5 cmH2O
Patient temperature: 37
pCO2 arterial: 55 mm[Hg] — ABNORMAL HIGH (ref 32–48)
pH, Arterial: 7.04 — CL (ref 7.35–7.45)
pO2, Arterial: 152 mm[Hg] — ABNORMAL HIGH (ref 83–108)

## 2023-11-07 LAB — TROPONIN I (HIGH SENSITIVITY): Troponin I (High Sensitivity): 16 ng/L (ref <18)

## 2023-11-07 LAB — CBC WITH DIFFERENTIAL/PLATELET
Abs Immature Granulocytes: 0.1 10*3/uL — ABNORMAL HIGH (ref 0.00–0.07)
Basophils Absolute: 0.1 10*3/uL (ref 0.0–0.1)
Basophils Relative: 1 %
Eosinophils Absolute: 0.5 10*3/uL (ref 0.0–0.5)
Eosinophils Relative: 4 %
HCT: 42.7 % (ref 39.0–52.0)
Hemoglobin: 13.4 g/dL (ref 13.0–17.0)
Immature Granulocytes: 1 %
Lymphocytes Relative: 57 %
Lymphs Abs: 7.9 10*3/uL — ABNORMAL HIGH (ref 0.7–4.0)
MCH: 32.2 pg (ref 26.0–34.0)
MCHC: 31.4 g/dL (ref 30.0–36.0)
MCV: 102.6 fL — ABNORMAL HIGH (ref 80.0–100.0)
Monocytes Absolute: 1.4 10*3/uL — ABNORMAL HIGH (ref 0.1–1.0)
Monocytes Relative: 10 %
Neutro Abs: 3.7 10*3/uL (ref 1.7–7.7)
Neutrophils Relative %: 27 %
Platelets: 273 10*3/uL (ref 150–400)
RBC: 4.16 MIL/uL — ABNORMAL LOW (ref 4.22–5.81)
RDW: 11.7 % (ref 11.5–15.5)
Smear Review: NORMAL
WBC: 13.6 10*3/uL — ABNORMAL HIGH (ref 4.0–10.5)
nRBC: 0 % (ref 0.0–0.2)

## 2023-11-07 LAB — URINE DRUG SCREEN, QUALITATIVE (ARMC ONLY)
Amphetamines, Ur Screen: NOT DETECTED
Barbiturates, Ur Screen: NOT DETECTED
Benzodiazepine, Ur Scrn: NOT DETECTED
Cannabinoid 50 Ng, Ur ~~LOC~~: NOT DETECTED
Cocaine Metabolite,Ur ~~LOC~~: NOT DETECTED
MDMA (Ecstasy)Ur Screen: NOT DETECTED
Methadone Scn, Ur: NOT DETECTED
Opiate, Ur Screen: NOT DETECTED
Phencyclidine (PCP) Ur S: NOT DETECTED
Tricyclic, Ur Screen: NOT DETECTED

## 2023-11-07 LAB — GLUCOSE, CAPILLARY: Glucose-Capillary: 100 mg/dL — ABNORMAL HIGH (ref 70–99)

## 2023-11-07 MED ORDER — EMPTY CONTAINERS FLEXIBLE MISC
1800.0000 mg | Freq: Once | Status: AC
  Administered 2023-11-07: 1800 mg via INTRAVENOUS
  Filled 2023-11-07: qty 180

## 2023-11-07 MED ORDER — LEVETIRACETAM IN NACL 500 MG/100ML IV SOLN: 500.0000 mg | Freq: Two times a day (BID) | INTRAVENOUS | Status: DC

## 2023-11-07 MED ORDER — POLYETHYLENE GLYCOL 3350 17 G PO PACK: 17.0000 g | PACK | Freq: Every day | ORAL | Status: DC

## 2023-11-07 MED ORDER — ETOMIDATE 2 MG/ML IV SOLN
INTRAVENOUS | Status: AC
  Filled 2023-11-07: qty 20

## 2023-11-07 MED ORDER — ACETAMINOPHEN 160 MG/5ML PO SOLN: 650.0000 mg | ORAL | Status: DC | PRN

## 2023-11-07 MED ORDER — LEVETIRACETAM IN NACL 1000 MG/100ML IV SOLN: 1000.0000 mg | Freq: Once | INTRAVENOUS | Status: DC

## 2023-11-07 MED ORDER — ROCURONIUM BROMIDE 10 MG/ML (PF) SYRINGE
PREFILLED_SYRINGE | INTRAVENOUS | Status: AC
  Filled 2023-11-07: qty 10

## 2023-11-07 MED ORDER — SENNOSIDES-DOCUSATE SODIUM 8.6-50 MG PO TABS: 1.0000 | ORAL_TABLET | Freq: Two times a day (BID) | ORAL | Status: DC

## 2023-11-07 MED ORDER — FENTANYL 2500MCG IN NS 250ML (10MCG/ML) PREMIX INFUSION
25.0000 ug/h | INTRAVENOUS | Status: DC
  Administered 2023-11-07: 25 ug/h via INTRAVENOUS
  Filled 2023-11-07: qty 250

## 2023-11-07 MED ORDER — ACETAMINOPHEN 650 MG RE SUPP
650.0000 mg | RECTAL | Status: DC | PRN
  Administered 2023-11-07: 650 mg via RECTAL
  Filled 2023-11-07: qty 2

## 2023-11-07 MED ORDER — DOCUSATE SODIUM 50 MG/5ML PO LIQD: 100.0000 mg | Freq: Two times a day (BID) | ORAL | Status: DC

## 2023-11-07 MED ORDER — STROKE: EARLY STAGES OF RECOVERY BOOK: Freq: Once | Status: DC

## 2023-11-07 MED ORDER — PANTOPRAZOLE SODIUM 40 MG IV SOLR: 40.0000 mg | Freq: Every day | INTRAVENOUS | Status: DC

## 2023-11-07 MED ORDER — CLEVIDIPINE BUTYRATE 0.5 MG/ML IV EMUL: 0.0000 mg/h | INTRAVENOUS | Status: DC

## 2023-11-07 MED ORDER — ACETAMINOPHEN 325 MG PO TABS: 650.0000 mg | ORAL_TABLET | ORAL | Status: DC | PRN

## 2023-11-07 MED ORDER — PROTHROMBIN COMPLEX CONC HUMAN 500 UNITS IV KIT: 50.0000 [IU]/kg | PACK | Status: DC

## 2023-11-07 MED ORDER — NALOXONE HCL 2 MG/2ML IJ SOSY
2.0000 mg | PREFILLED_SYRINGE | Freq: Once | INTRAMUSCULAR | Status: AC
  Administered 2023-11-07: 2 mg via INTRAVENOUS

## 2023-11-07 MED ORDER — MIDAZOLAM HCL 5 MG/5ML IJ SOLN
2.0000 mg | Freq: Once | INTRAMUSCULAR | Status: AC
  Administered 2023-11-08: 2 mg via INTRAVENOUS
  Filled 2023-11-07: qty 5

## 2023-11-07 MED ORDER — NALOXONE HCL 2 MG/2ML IJ SOSY
PREFILLED_SYRINGE | INTRAMUSCULAR | Status: AC
  Filled 2023-11-07: qty 2

## 2023-11-07 MED ORDER — FENTANYL BOLUS VIA INFUSION
25.0000 ug | INTRAVENOUS | Status: DC | PRN
Start: 2023-11-07 — End: 2023-11-08
  Administered 2023-11-07 – 2023-11-08 (×2): 100 ug via INTRAVENOUS

## 2023-11-07 MED ORDER — PROPOFOL 1000 MG/100ML IV EMUL
INTRAVENOUS | Status: AC
  Administered 2023-11-07: 5 ug/kg/min via INTRAVENOUS
  Filled 2023-11-07: qty 100

## 2023-11-07 NOTE — ED Provider Notes (Signed)
[facility] Provider Note    Event Date/Time   First MD Initiated Contact with Patient 11/07/23 2034     (approximate)   History   Unresponsive   HPI  Jared Whitaker. is a 77 y.o. male who presents to the emergency department today because of concerns for unresponsiveness.  All history is obtained from EMS.  They state that he was out at a restaurant eating by himself.  Bystanders stated that he then complained of headache and had seizure-like activity.  He was unresponsive when first responders arrived on scene. Found to be hypoxic by EMS. Started bagging on scene.       Physical Exam   Triage Vital Signs: ED Triage Vitals  Encounter Vitals Group     BP 11/07/23 2021 (!) 169/63     Systolic BP Percentile --      Diastolic BP Percentile --      Pulse Rate 11/07/23 2021 94     Resp 11/07/23 2021 18     Temp --      Temp src --      SpO2 11/07/23 2018 93 %     Weight --      Height 11/07/23 2034 5\' 7"  (1.702 m)     Head Circumference --      Peak Flow --      Pain Score --      Pain Loc --      Pain Education --      Exclude from Growth Chart --     Most recent vital signs: Vitals:   11/07/23 2029 11/07/23 2030  BP: (!) 171/79 (!) 148/57  Pulse: 78   Resp: (!) 25 (!) 23  SpO2: 100%    General: Unresponsive to all verbal and painful stimuli. CV:  Good peripheral perfusion.  Resp:  Spontaneously breathing. Lungs clear. Abd:  No distention.  Other:  Pinpoint pupils.    ED Results / Procedures / Treatments   Labs (all labs ordered are listed, but only abnormal results are displayed) Labs Reviewed  BLOOD GAS, ARTERIAL - Abnormal; Notable for the following components:      Result Value   pH, Arterial 7.04 (*)    pCO2 arterial 55 (*)    pO2, Arterial 152 (*)    Bicarbonate 14.9 (*)    Acid-base deficit 15.9 (*)    All other components within normal limits  CBC WITH DIFFERENTIAL/PLATELET - Abnormal; Notable for the following  components:   WBC 13.6 (*)    RBC 4.16 (*)    MCV 102.6 (*)    Lymphs Abs 7.9 (*)    Monocytes Absolute 1.4 (*)    Abs Immature Granulocytes 0.10 (*)    All other components within normal limits  COMPREHENSIVE METABOLIC PANEL - Abnormal; Notable for the following components:   Potassium 3.0 (*)    CO2 18 (*)    Glucose, Bld 151 (*)    Creatinine, Ser 1.45 (*)    GFR, Estimated 49 (*)    Anion gap 18 (*)    All other components within normal limits  URINALYSIS, ROUTINE W REFLEX MICROSCOPIC - Abnormal; Notable for the following components:   Color, Urine STRAW (*)    APPearance CLEAR (*)    Hgb urine dipstick SMALL (*)    Protein, ur 100 (*)    Bacteria, UA RARE (*)    All other components within normal limits  PATHOLOGIST SMEAR REVIEW  URINE DRUG SCREEN, QUALITATIVE (  [facility] ONLY)  TROPONIN I (HIGH SENSITIVITY)     EKG  I, Phineas Semen, attending physician, personally viewed and interpreted this EKG  EKG Time: 2029 Rate: 78 Rhythm: atrial fibrillation with PVCs Axis: normal Intervals: qtc 400 QRS: 400 ST changes: no st elevation Impression: abnormal ekg    RADIOLOGY I independently interpreted and visualized the CXR. My interpretation: ET in appropriate position. Radiology interpretation:  IMPRESSION:  Endotracheal tube 4 cm above the carina.    No acute cardiopulmonary disease.    I independently interpreted and visualized the CT head. My interpretation: pontine bleed Radiology interpretation:  IMPRESSION:  1. Acute hemorrhage in the pons, measuring up to 2.7 cm, with a  approximate volume of 7 mL. Hemorrhage extends into the fourth  ventricle. Surrounding hypodensity, likely edema, with mass effect  on the fourth ventricle and medial cerebellum. Edema extends  superiorly into the midbrain.  2. Remote lacunar infarcts in the bilateral basal ganglia; the left  basal ganglia infarct is new compared to 2021 CT.    I, Phineas Semen, personally discussed  these images (CT head) and results by phone with the on-call radiologist and used this discussion as part of my medical decision making.     PROCEDURES:  Critical Care performed: Yes  CRITICAL CARE Performed by: Phineas Semen   Total critical care time: 55 minutes  Critical care time was exclusive of separately billable procedures and treating other patients.  Critical care was necessary to treat or prevent imminent or life-threatening deterioration.  Critical care was time spent personally by me on the following activities: development of treatment plan with patient and/or surrogate as well as nursing, discussions with consultants, evaluation of patient's response to treatment, examination of patient, obtaining history from patient or surrogate, ordering and performing treatments and interventions, ordering and review of laboratory studies, ordering and review of radiographic studies, pulse oximetry and re-evaluation of patient's condition.   Procedures  INTUBATION Performed by: Phineas Semen  Required items: required blood products, implants, devices, and special equipment available Patient identity confirmed: provided demographic data and hospital-assigned identification number Time out: Immediately prior to procedure a "time out" was called to verify the correct patient, procedure, equipment, support staff and site/side marked as required.  Indications: Unresponsiveness  Intubation method: Glidescope Laryngoscopy   Preoxygenation: BVM  Sedatives: Etomidate Paralytic: Rocuronium  Tube Size: 7.5 cuffed  Post-procedure assessment: chest rise and ETCO2 monitor Breath sounds: equal and absent over the epigastrium Tube secured with: ETT holder Chest x-ray interpreted by radiologist and me.  Chest x-ray findings: endotracheal tube in appropriate position  Patient tolerated the procedure well with no immediate complications.    MEDICATIONS ORDERED IN  ED: Medications  propofol (DIPRIVAN) 1000 MG/100ML infusion (has no administration in time range)  rocuronium (ZEMURON) 100 MG/10ML injection (  Given 11/07/23 2024)  etomidate (AMIDATE) 2 MG/ML injection (  Given 11/07/23 2024)  naloxone [facility]) injection 2 mg (2 mg Intravenous Given 11/07/23 2023)     IMPRESSION / MDM / ASSESSMENT AND PLAN / ED COURSE  I reviewed the triage vital signs and the nursing notes.                              Differential diagnosis includes, but is not limited to, aspiration, ACS, ICH, infection  Patient's presentation is most consistent with acute presentation with potential threat to life or bodily function.   The patient is on the cardiac monitor  to evaluate for evidence of arrhythmia and/or significant heart rate changes.  Patient brought to the emergency department today by EMS after becoming unresponsive at a restaurant.  On arrival patient with GCS of 3.  He was emergently intubated.  Postintubation chest x-ray with appropriate position.  He was sent for a CT scan given complaint of headache.  This unfortunately was consistent with a pontine hemorrhage.  Discussed with both neurosurgery and neurology.  At this time no acute intervention indicated.  Likely not survivable event.  Did try to get in contact with patient's emergency contact however unable to get in touch with her.  Did discuss with Webb Silversmith with the ICU team who will evaluate for admission.    FINAL CLINICAL IMPRESSION(S) / ED DIAGNOSES   Final diagnoses:  Pontine hemorrhage (HCC)      Note:  This document was prepared using Dragon voice recognition software and may include unintentional dictation errors.    Phineas Semen, MD 11/07/23 [phone]

## 2023-11-07 NOTE — ED Triage Notes (Signed)
Patient to ED via [facility] from [facility]. Patient was seen to have "grabbed his head" and then had "seizure-like" activity. Patient was thought by EMS to have aspirated on some food. Patient was intubated in route to ED, 100 mm OPA. When EMS first arrived, patient sats in the 70s but after bagging, patient sats at 93%. No known Hx at this time.

## 2023-11-07 NOTE — IPAL (Addendum)
INTERDISCIPLINARY GOALS OF CARE FAMILY MEETING  Patient Name: Khaliq Burggraf.   MRN: [id number]   Date of Birth/ Sex: [birth year] , male      Admission Date: 11/07/2023  Attending Provider: Janann Colonel, MD  Primary Diagnosis: Pontine hemorrhage [facility])   Date carried out: 11/07/23 Location of the meeting: Bedside   Member's involved: NP and patient's decision maker. Patient is the only child with no family member, Kandice Robinsons is the designated POA   Durable Power of Attorney or acting medical decision maker: Kandice Robinsons   Discussion:  Advance Care Planning/Goals of Care discussion was performed during the course of treatment to decide on type of care right for this patient following presentation to the ED.   I discussed patient's POA Kandice Robinsons who is listed as the emergency contact to discuss goals of care in details following acute change in patient's current status. Reviewed patient's Head CT with her which reveals 1. Acute hemorrhage in the pons, measuring up to 2.7 cm, with approximate volume of 7 mL. Hemorrhage extends into the fourth ventricle. Surrounding hypodensity, likely edema, with mass effect on the fourth ventricle and medial cerebellum. Edema extends superiorly into the midbrainI also discussed with family that due to ICH patient not a candidate for intervention per neurosurgery's recommendations. Discussed likelihood of progression to brain death/herniation with overall poor neurological outcome with the family at the bedside and answered all their question.   Discussed poor prognosis, expected outcome with or without ongoing aggressive treatments and the options for de-escalation of care.   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 Prognosis: Poor Code Status: DNR Disposition: ICU Next Steps:  Family  understands the situation. They have consented and agreed to DNR/DNI and would not wish to pursue any aggressive treatment.  Patient's family would like to proceed with full comfort care including terminal extubation.   Family are satisfied with Plan of action and management. All questions answered     Total Time Spent Face to Face addressing advance care planning in the presence of the Patient: 35 minutes   Webb Silversmith, DNP, CCRN, FNP-C, AGACNP-BC Acute Care & Family Nurse Practitioner  [facility]  See Amion for personal pager PCCM on call pager [phone] until 7 am

## 2023-11-07 NOTE — ED Notes (Signed)
NP at bedside. Versed to be held

## 2023-11-07 NOTE — H&P (Addendum)
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  Per EMS run sheet and ED reports, patient was eating dinner alone at [facility] when he became unresponsive.  Per bystander, patient complained of sudden onset headache followed by seizure-like activity.  On EMS arrival patient was unresponsive and hypoxic.  EMS started bagging and transported patient to the ED.   ED Course: Initial vital signs showed HR of 94 beats/minute, BP 169/63 mm Hg, the RR 18 breaths/minute, and the oxygen saturation 98% on  the vent and a temperature of 99.74F (37.4C). [name] Patient was unresponsive with pinpoint pupils.  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  Pertinent Labs/Diagnostics Findings: Na+/ K+:138/3.0  Glucose: 151 BUN/Cr.:18/1.45 CO 18 Anion Gap 18 WBC: 13.6 K/L without bands. neutrophil predominance    ABG: pO2 152; pCO2 55; pH 7.04;  HCO3 14.9, %O2 Sat 100.  CXR> CTH> see result Medication administered in the NW:GNFAOZHY,QMVHQIONGE, Etomidate, Naloxone  Disposition:CTH findings were discussed with both neurosurgery and neurology who recommended no acute intervention as likely not survivable event. 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 No acute cardiopulmonary disease.  12/26: Noncontrast CT head> IMPRESSION: 1. Acute hemorrhage in the pons, measuring up to 2.7 cm, with a approximate volume of 7 mL. Hemorrhage extends into the fourth ventricle. Surrounding hypodensity, likely edema, with mass effect on the fourth ventricle and medial cerebellum. Edema extends superiorly into the midbrain. 2. Remote lacunar infarcts in the bilateral basal ganglia; the left basal ganglia infarct is new compared to 2021 CT.   Interim History / Subjective:      Micro Data:  NONE  Antimicrobials:  NONE  OBJECTIVE  Blood pressure (!) 82/32, pulse (!) 49, temperature (!) 101.9 F (38.8 C), resp. rate (!) 24, height 5' 7.01" (1.702 m), weight 99.5 kg, SpO2 100%.    Vent Mode: AC FiO2 (%):  [100 %] 100 % Set Rate:  [20 bmp-24 bmp] 24 bmp Vt Set:  [500 mL-550 mL] 550 mL PEEP:  [5 cmH20] 5 cmH20  No intake or output data in the 24 hours ending 11/07/23 2328 Filed Weights   11/07/23 2034  Weight: 99.5 kg   Physical Examination  GENERAL:77  year-old critically ill patient lying in the bed intubated and sedated EYES: PEERLA. No scleral icterus. Extraocular muscles intact.  HEENT: Head atraumatic, normocephalic. Oropharynx and nasopharynx clear.  NECK:  No JVD, supple  LUNGS: Normal breath sounds bilaterally.  No use of accessory muscles of respiration.  CARDIOVASCULAR: S1,  S2 normal. No murmurs, rubs, or gallops.  ABDOMEN: Soft, NTND EXTREMITIES: No swelling or erythema.  Capillary refill < 3 seconds in all extremities. Pulses palpable distally. NEUROLOGIC:Mental Status: Patient does not respond to verbal stimuli.   Does not respond to deep sternal rub.  Does not follow commands.  No verbalizations noted.  Cranial Nerves: II: patient does not respond confrontation bilaterally, pupils right 1 mm, left 1 mm,and nonreactive bilaterally III,IV,VI: doll's response absent bilaterally V,VII: corneal reflex absent bilaterally  VIII: patient does not respond to verbal stimuli IX,X: gag reflex absent, XI: trapezius strength unable to test bilaterally XII: tongue strength unable to test Motor: Extremities flaccid throughout.  No spontaneous movement noted.  No purposeful movements noted. Sensory: Does not respond to noxious stimuli in any extremity. Deep Tendon Reflexes:  Absent throughout. Cerebellar: Unable to perform SKIN: No obvious rash, lesion, or ulcer. Warm to touch Labs/imaging that I havepersonally reviewed  (right click and "Reselect all SmartList Selections" daily)     Labs   CBC: Recent Labs  Lab 11/07/23 2026  WBC 13.6*  NEUTROABS 3.7  HGB 13.4  HCT 42.7  MCV 102.6*  PLT 273    Basic Metabolic Panel: Recent Labs  Lab 11/07/23 2026  NA 138  K 3.0*  CL 102  CO2 18*  GLUCOSE 151*  BUN 18  CREATININE 1.45*  CALCIUM 9.2   GFR: Estimated Creatinine Clearance: 46.5 mL/min (A) (by C-G formula based on SCr of 1.45 mg/dL (H)). Recent Labs  Lab 11/07/23 2026  WBC 13.6*    Liver Function Tests: Recent Labs  Lab 11/07/23 2026  AST 37  ALT 19  ALKPHOS 78  BILITOT 0.7  PROT 7.7  ALBUMIN 4.3   No results for input(s): "LIPASE", "AMYLASE" in the last 168 hours. No results for input(s): "AMMONIA" in the last 168 hours.  ABG    Component Value Date/Time   PHART 7.04 (LL) 11/07/2023 2040   PCO2ART 55 (H) 11/07/2023 2040   PO2ART 152 (H) 11/07/2023 2040   HCO3 14.9 (L) 11/07/2023 2040   ACIDBASEDEF 15.9 (H) 11/07/2023 2040   O2SAT 100 11/07/2023 2040     Coagulation Profile: No results for input(s): "INR", "PROTIME" in the last 168 hours.  Cardiac Enzymes: No  results for input(s): "CKTOTAL", "CKMB", "CKMBINDEX", "TROPONINI" in the last 168 hours.  HbA1C: No results found for: "HGBA1C"  CBG: Recent Labs  Lab 11/07/23 2324  GLUCAP 100*    Review of Systems:   Unable to be obtained secondary to the patient's intubated and sedated status.   Past Medical History  He,  has a past medical history of A-fib (HCC), Anemia, Anemia, Asthma, Carotid stenosis, Colon polyp, Contact dermatitis, Dilated aortic root (HCC), Hyperlipidemia, Hypertension, Normal cardiac stress test, Osteoarthritis, Overweight, Persistent atrial fibrillation (HCC), Shortness of breath, Sleep apnea, Snoring, and Stroke (HCC).   Surgical History    Past Surgical History:  Procedure Laterality Date   CARDIOVERSION N/A 03/03/2014   Procedure: CARDIOVERSION;  Surgeon: Luis Abed, MD;  Location: [facility] ENDOSCOPY;  Service: Cardiovascular;  Laterality: N/A;   CARDIOVERSION N/A 01/31/2015   Procedure: CARDIOVERSION;  Surgeon: Luis Abed, MD;  Location: [facility] ENDOSCOPY;  Service: Cardiovascular;  Laterality: N/A;   COLONOSCOPY     COLONOSCOPY     x3   COLONOSCOPY N/A 05/01/2022   Procedure: COLONOSCOPY;  Surgeon: Jaynie Collins, DO;  Location: [facility] ENDOSCOPY;  Service: Gastroenterology;  Laterality: N/A;   left leg fracture  11/13/1967  Social History   reports that he has never smoked. He has never used smokeless tobacco. He reports current alcohol use of about 1.0 - 2.0 standard drink of alcohol per week. He reports that he does not use drugs.   Family History   His family history includes Bladder Cancer in his father; CVA in his mother; Cancer in his paternal uncle; Heart attack in his father and mother.   Allergies Allergies  Allergen Reactions   Penicillins Rash    Purple spots, swelling     Home Medications  Prior to Admission medications   Medication Sig Start Date End Date Taking? Authorizing Provider  albuterol (VENTOLIN HFA) 108 (90 Base) MCG/ACT  inhaler Inhale 2 puffs into the lungs every 6 (six) hours as needed for wheezing or shortness of breath. 09/20/21  Yes Coralyn Helling, MD  azelastine (ASTELIN) 0.1 % nasal spray Place 1 spray into both nostrils 2 (two) times daily. Use in each nostril as directed 08/03/21  Yes Coralyn Helling, MD  carboxymethylcellulose (REFRESH PLUS) 0.5 % SOLN 3 (three) times daily as needed   Yes [provider]  clotrimazole-betamethasone (LOTRISONE) cream Apply 1 Application topically 2 (two) times daily. 10/14/23 10/13/24 Yes [provider]  Cyanocobalamin (VITAMIN B 12 PO) Take 2,000 mcg by mouth daily. Daily   Yes [provider]  fluticasone (FLONASE) 50 MCG/ACT nasal spray Place 1 spray into both nostrils daily. 08/03/21  Yes Coralyn Helling, MD  fluticasone furoate-vilanterol (BREO ELLIPTA) 100-25 MCG/INH AEPB Inhale 1 puff into the lungs daily. 07/18/21  Yes Glenford Bayley, NP  furosemide (LASIX) 20 MG tablet Take 20 mg by mouth daily.   Yes [provider]  montelukast (SINGULAIR) 10 MG tablet TAKE 1 TABLET BY MOUTH EVERYDAY AT BEDTIME 07/29/23  Yes Parrett, Tammy S, NP  rosuvastatin (CRESTOR) 5 MG tablet Take 5 mg by mouth daily.   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 Keep platelets >100k, INR<1.4 -Pain control -No sedation needed necessary as exam is extremely poor with posturing noted   -MRI ICH Protocol  - No HepSQ, antiplatelet or anticoagulant agents at this time -Labs: Mg, Phos, lipids, HbA1c, urinalysis. -Normothermia - For temperature >37.5C - acetaminophen 650mg  q4-6 hours PRN. -Relative euglycemia (~ <180) and treat if hyperglycemia (>200 mg/dL)/hypoglycemia (< 60mg /dL).  -Strict I/Os. -Neurosurgery neurology consulted.  Per neurosurgery likely not survivable event therefore would not offer any intervention.  #Acute Hypoxic and Hypercapnic Respiratory Failure 2/2 to above and inability to protect airway Hx of COPD with Asthma overlap, OSA not on CPAP.    -full mechanical support 6-8cc/kg/Vt  -titrate FiO2, PEEP to maintain O2 sat >90%  -Lung  protective ventilation  -PRN Chest X-ray & ABG -PRN and scheduled bronchodilators -SAT/SBT when appropriate  -prn fentanyl, prop for RASS -1   #Seizures Likely provoked in the setting of intracerebral hemorrhage -Seizure precaution -As needed lorazepam/Versed for breakthrough seizures -Load Keppra 1 g continue Keppra twice daily   #AKI on CKD stage III #Hypokalemia -Monitor I&O's / urinary output -Follow BMP -Ensure adequate renal perfusion -Avoid nephrotoxic agents as able -Replace electrolytes as indicated    Best practice:  Diet:  NPO Pain/Anxiety/Delirium protocol (if indicated): Yes (RASS goal -1) VAP protocol (if indicated): Yes DVT prophylaxis: Contraindicated GI prophylaxis: N/A Glucose control:  SSI No Central venous access:  N/A Arterial line:  N/A Foley:  Yes, and it is still needed Mobility:  bed rest  PT consulted: N/A Last date of multidisciplinary goals of care  discussion [SEE IPAL NOTE] Code Status:  DNR Disposition: ICU   = Goals of Care = Code Status Order: DNR  Primary Emergency Contact: Cain,Connie, Home Phone: [phone] SEE IPAL NOTE  Critical care time: 45 minutes        Webb Silversmith DNP, CCRN, FNP-C, AGACNP-BC Acute Care & Family Nurse Practitioner [facility] PCCM on call pager [phone]

## 2023-11-07 NOTE — Progress Notes (Signed)
eLink Physician-Brief Progress Note Patient Name: Jared Whitaker. DOB: 08/27/1946 MRN: [id number]   Date of Service  11/07/2023  HPI/Events of Note  77/M brought in after he was found unresponsive while at an [employer].  He had complained of headache then has seizure-like activity.  Pt was intubated in the ED.   CT head shows acute hemorrhage in the pons upto 2.7cm, extending into the fourth ventricle.  There is edema with mass effect on the fourth ventricle and medial cerebellum.  Edema extends superiorly into the midbrain. No neurosurgical intervention recommended as per discussion with ED provider.   NP spoke to the patient's designated POA and decision was made to pursue comfort care.   eICU Interventions  Continue with comfort care.      Intervention Category Evaluation Type: New Patient Evaluation  Larinda Buttery 11/07/2023, 11:36 PM

## 2023-11-07 NOTE — ED Notes (Signed)
Report given to [place], RN ICU

## 2023-11-08 LAB — PATHOLOGIST SMEAR REVIEW

## 2023-11-08 MED ORDER — GLYCOPYRROLATE 1 MG PO TABS: 1.0000 mg | ORAL_TABLET | ORAL | Status: DC | PRN

## 2023-11-08 MED ORDER — MIDAZOLAM HCL 2 MG/2ML IJ SOLN
2.0000 mg | INTRAMUSCULAR | Status: DC | PRN
  Filled 2023-11-08: qty 2

## 2023-11-08 MED ORDER — GLYCOPYRROLATE 0.2 MG/ML IJ SOLN: 0.2000 mg | INTRAMUSCULAR | Status: DC | PRN

## 2023-11-08 MED ORDER — POLYVINYL ALCOHOL 1.4 % OP SOLN: 1.0000 [drp] | Freq: Four times a day (QID) | OPHTHALMIC | Status: DC | PRN

## 2023-11-08 MED ORDER — MORPHINE SULFATE (PF) 2 MG/ML IV SOLN
2.0000 mg | INTRAVENOUS | Status: DC | PRN
  Administered 2023-11-08: 2 mg via INTRAVENOUS
  Filled 2023-11-08: qty 1

## 2023-11-13 NOTE — Progress Notes (Signed)
Extubated patient to comfort care

## 2023-11-13 NOTE — Death Summary Note (Addendum)
DEATH SUMMARY   Patient Details  Name: Jared Whitaker. 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  Seizures complications of acute pontine hemorrhage. Principal Problem:   Pontine hemorrhage [facility])   Brief Hospital Course (including significant findings, care, treatment, and services provided and events leading to death)  Jared Whitaker. is a 78 y.o. 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   Per EMS run sheet and ED reports, patient was eating dinner alone at [facility] when he became unresponsive.  Per bystander, patient complained of sudden onset headache followed by seizure-like activity.  On EMS arrival patient was unresponsive and hypoxic.  EMS started bagging and transported patient to the ED.   ED Course: Initial vital signs showed HR of 94 beats/minute, BP 169/63 mm Hg, the RR 18 breaths/minute, and the oxygen saturation 98% on  the vent and a temperature of 99.68F (37.4C). Patient was unresponsive with pinpoint pupils.  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   Pertinent  Labs/Diagnostics Findings: Na+/ K+:138/3.0  Glucose: 151 BUN/Cr.:18/1.45 CO 18 Anion Gap 18 WBC: 13.6 K/L without bands. neutrophil predominance    PCT: negative <0.10  Lactic acid:  ABG: pO2 152; pCO2 55; pH 7.04;  HCO3 14.9, %O2 Sat 100.  CXR> CTH> see result Medication administered in the FA:OZHYQMVH,QIONGEXBMW, Etomidate, Naloxone  Disposition:CTH findings were discussed with both neurosurgery and neurology who recommended no acute intervention as likely not survivable event. Patient was admitted to ICU with PCCM consult  ICU Course: Goals of care discussed with patient's next of kin who is the surrogate decision maker. Patient POA brought advanced directives designating her as the health care POA and also patient's wishes. Per ADVANCED DIRECTIVES on file, patient would have not wished for life sustaining measures/interventions in the event of a cardiac or respiratory arrest and or life changing diagnosis with poor outcome/prognosis leading to vegetative state.  Decision was made to make patient DNR and transition to comfort care with terminal extubation.  Patient was terminally extubated at 12:18 and expired shortly at 00:43 with family at bedside.  Pertinent Labs and Studies  Significant Diagnostic Studies CT Head Wo Contrast Result Date: 12/01/23 CLINICAL DATA:  Altered mental status, seizure like active EXAM: CT HEAD WITHOUT CONTRAST TECHNIQUE: Contiguous axial images were obtained from the base of the skull through the vertex without intravenous contrast. RADIATION DOSE REDUCTION: This exam was performed according to the departmental dose-optimization program which includes automated exposure control, adjustment of the mA and/or kV according to patient size and/or use of iterative reconstruction technique. COMPARISON:  11/27/2019 CT head FINDINGS: Brain: Hyperdense, acute hemorrhage in the pons, measuring up to 2.7 x 2.2 x 2.5 cm (AP x TR x  CC) (series 2, image 11 and series 4, image 49),  with a approximate volume of 7 mL. Hemorrhage extends into the fourth ventricle. Surrounding hypodensity, likely edema, with mass effect on the fourth ventricle and medial cerebellum. Edema extends superiorly into the midbrain. No evidence of acute infarct, midline shift, or hydrocephalus. Remote lacunar infarcts in the bilateral basal ganglia; the a left basal ganglia infarct is new compared to 2021 CT (series 2, image 20). Vascular: No hyperdense vessel. Atherosclerotic calcifications in the intracranial carotid and vertebral arteries. Skull: Negative for fracture or focal lesion. Sinuses/Orbits: Mucosal thickening in the ethmoid air cells, right sphenoid sinus, and right maxillary sinus. Status post bilateral lens replacements. Fluid in the right-greater-than-left mastoid air cells. Other: Fluid in the posterior nasal cavity and nasopharynx, related to intubation. IMPRESSION: 1. Acute hemorrhage in the pons, measuring up to 2.7 cm, with a approximate volume of 7 mL. Hemorrhage extends into the fourth ventricle. Surrounding hypodensity, likely edema, with mass effect on the fourth ventricle and medial cerebellum. Edema extends superiorly into the midbrain. 2. Remote lacunar infarcts in the bilateral basal ganglia; the left basal ganglia infarct is new compared to 2021 CT. These findings were discussed by telephone on 11/07/2023 at 9:13 pm with provider GRAYDON GOODMAN . Electronically Signed   By: Wiliam Ke M.D.   On: 11/07/2023 21:14   DG Chest Portable 1 View Result Date: 11/07/2023 CLINICAL DATA:  Intubation EXAM: PORTABLE CHEST 1 VIEW COMPARISON:  07/17/2016 FINDINGS: Endotracheal tube is 4 cm above the carina. NG tube tip is in the stomach with the side port in the distal esophagus. Cardiomegaly. No confluent opacities, effusions or edema. No acute bony abnormality. IMPRESSION: Endotracheal tube 4 cm above the carina. No acute cardiopulmonary disease. Electronically Signed   By: Charlett Nose M.D.    On: 11/07/2023 20:47    Microbiology No results found for this or any previous visit (from the past 240 hours).  Lab Basic Metabolic Panel: Recent Labs  Lab 11/07/23 2026  NA 138  K 3.0*  CL 102  CO2 18*  GLUCOSE 151*  BUN 18  CREATININE 1.45*  CALCIUM 9.2   Liver Function Tests: Recent Labs  Lab 11/07/23 2026  AST 37  ALT 19  ALKPHOS 78  BILITOT 0.7  PROT 7.7  ALBUMIN 4.3   No results for input(s): "LIPASE", "AMYLASE" in the last 168 hours. No results for input(s): "AMMONIA" in the last 168 hours. CBC: Recent Labs  Lab 11/07/23 2026  WBC 13.6*  NEUTROABS 3.7  HGB 13.4  HCT 42.7  MCV 102.6*  PLT 273   Cardiac Enzymes: No results for input(s): "CKTOTAL", "CKMB", "CKMBINDEX", "TROPONINI" in the last 168 hours. Sepsis Labs: Recent Labs  Lab 11/07/23 2026  WBC 13.6*    Procedures/Operations  12/26: Intubation    Webb Silversmith, DNP, CCRN, FNP-C, AGACNP-BC Acute Care & Family Nurse Practitioner  [facility]  See Amion for personal pager PCCM on call pager [phone] until 7 am   10/31/2023, 1:33 AM

## 2023-11-13 DEATH — deceased

## 2023-11-21 ENCOUNTER — Ambulatory Visit: Payer: Self-pay | Admitting: Urology

## 2024-01-18 ENCOUNTER — Other Ambulatory Visit: Payer: Self-pay | Admitting: Adult Health

## 2024-04-21 ENCOUNTER — Other Ambulatory Visit: Payer: Medicare HMO
